# Patient Record
Sex: Female | Born: 1995 | Race: Black or African American | Hispanic: No | Marital: Single | Smoking: Never smoker
Health system: Southern US, Community
[De-identification: ages and names within clinical notes are randomized; demographics above are authoritative.]

## PROBLEM LIST (undated history)

## (undated) ENCOUNTER — Inpatient Hospital Stay (HOSPITAL_COMMUNITY): Payer: Self-pay

## (undated) DIAGNOSIS — J45909 Unspecified asthma, uncomplicated: Secondary | ICD-10-CM

## (undated) DIAGNOSIS — F419 Anxiety disorder, unspecified: Secondary | ICD-10-CM

## (undated) DIAGNOSIS — F32A Depression, unspecified: Secondary | ICD-10-CM

## (undated) DIAGNOSIS — R519 Headache, unspecified: Secondary | ICD-10-CM

## (undated) HISTORY — PX: OTHER SURGICAL HISTORY: SHX169

## (undated) HISTORY — PX: NO PAST SURGERIES: SHX2092

## (undated) HISTORY — DX: Headache, unspecified: R51.9

## (undated) HISTORY — DX: Depression, unspecified: F32.A

---

## 2020-03-20 ENCOUNTER — Encounter (HOSPITAL_COMMUNITY): Payer: Self-pay

## 2020-03-20 ENCOUNTER — Other Ambulatory Visit: Payer: Self-pay

## 2020-03-20 ENCOUNTER — Emergency Department (HOSPITAL_COMMUNITY)
Admission: EM | Admit: 2020-03-20 | Discharge: 2020-03-20 | Disposition: A | Discharge status: Home / Self Care | Payer: Self-pay | Attending: Emergency Medicine | Admitting: Emergency Medicine

## 2020-03-20 ENCOUNTER — Emergency Department (HOSPITAL_COMMUNITY): Payer: Self-pay

## 2020-03-20 DIAGNOSIS — R079 Chest pain, unspecified: Secondary | ICD-10-CM | POA: Insufficient documentation

## 2020-03-20 DIAGNOSIS — Z5321 Procedure and treatment not carried out due to patient leaving prior to being seen by health care provider: Secondary | ICD-10-CM | POA: Insufficient documentation

## 2020-03-20 HISTORY — DX: Unspecified asthma, uncomplicated: J45.909

## 2020-03-20 MED ORDER — SODIUM CHLORIDE 0.9% FLUSH: 3.0000 mL | Freq: Once | INTRAVENOUS | Status: DC

## 2020-03-20 NOTE — ED Notes (Signed)
Called in lobby times x1 no answer, will re check shortly.

## 2020-03-20 NOTE — ED Triage Notes (Signed)
Patient states she has had chest pain x 3 days. Patient also c/o left arm tingling and numbness.

## 2020-03-20 NOTE — ED Triage Notes (Signed)
Pt BIB [facility] c/o R arm tingling x3 days. Hx of anxiety. No injury.

## 2020-04-22 ENCOUNTER — Emergency Department (HOSPITAL_BASED_OUTPATIENT_CLINIC_OR_DEPARTMENT_OTHER): Payer: Self-pay

## 2020-04-22 ENCOUNTER — Emergency Department (HOSPITAL_BASED_OUTPATIENT_CLINIC_OR_DEPARTMENT_OTHER)
Admission: EM | Admit: 2020-04-22 | Discharge: 2020-04-22 | Disposition: A | Discharge status: Home / Self Care | Payer: Self-pay | Attending: Emergency Medicine | Admitting: Emergency Medicine

## 2020-04-22 ENCOUNTER — Encounter (HOSPITAL_BASED_OUTPATIENT_CLINIC_OR_DEPARTMENT_OTHER): Payer: Self-pay | Admitting: Emergency Medicine

## 2020-04-22 ENCOUNTER — Other Ambulatory Visit: Payer: Self-pay

## 2020-04-22 DIAGNOSIS — R0789 Other chest pain: Secondary | ICD-10-CM | POA: Insufficient documentation

## 2020-04-22 HISTORY — DX: Anxiety disorder, unspecified: F41.9

## 2020-04-22 LAB — CBC WITH DIFFERENTIAL/PLATELET
Abs Immature Granulocytes: 0 10*3/uL (ref 0.00–0.07)
Basophils Absolute: 0 10*3/uL (ref 0.0–0.1)
Basophils Relative: 0 %
Eosinophils Absolute: 0.1 10*3/uL (ref 0.0–0.5)
Eosinophils Relative: 2 %
HCT: 35.6 % — ABNORMAL LOW (ref 36.0–46.0)
Hemoglobin: 11.4 g/dL — ABNORMAL LOW (ref 12.0–15.0)
Immature Granulocytes: 0 %
Lymphocytes Relative: 36 %
Lymphs Abs: 1.3 10*3/uL (ref 0.7–4.0)
MCH: 29.8 pg (ref 26.0–34.0)
MCHC: 32 g/dL (ref 30.0–36.0)
MCV: 93.2 fL (ref 80.0–100.0)
Monocytes Absolute: 0.5 10*3/uL (ref 0.1–1.0)
Monocytes Relative: 13 %
Neutro Abs: 1.8 10*3/uL (ref 1.7–7.7)
Neutrophils Relative %: 49 %
Platelets: 230 10*3/uL (ref 150–400)
RBC: 3.82 MIL/uL — ABNORMAL LOW (ref 3.87–5.11)
RDW: 11.9 % (ref 11.5–15.5)
WBC: 3.7 10*3/uL — ABNORMAL LOW (ref 4.0–10.5)
nRBC: 0 % (ref 0.0–0.2)

## 2020-04-22 LAB — BASIC METABOLIC PANEL
Anion gap: 6 (ref 5–15)
BUN: 13 mg/dL (ref 6–20)
CO2: 26 mmol/L (ref 22–32)
Calcium: 8.4 mg/dL — ABNORMAL LOW (ref 8.9–10.3)
Chloride: 107 mmol/L (ref 98–111)
Creatinine, Ser: 0.68 mg/dL (ref 0.44–1.00)
GFR calc Af Amer: >60 mL/min (ref >60)
GFR calc non Af Amer: >60 mL/min (ref >60)
Glucose, Bld: 131 mg/dL — ABNORMAL HIGH (ref 70–99)
Potassium: 3.2 mmol/L — ABNORMAL LOW (ref 3.5–5.1)
Sodium: 139 mmol/L (ref 135–145)

## 2020-04-22 LAB — PREGNANCY, URINE: Preg Test, Ur: NEGATIVE

## 2020-04-22 LAB — TROPONIN I (HIGH SENSITIVITY): Troponin I (High Sensitivity): 2 ng/L (ref <18)

## 2020-04-22 MED ORDER — ALBUTEROL SULFATE HFA 108 (90 BASE) MCG/ACT IN AERS: 1.0000 | INHALATION_SPRAY | Freq: Once | RESPIRATORY_TRACT | Status: DC

## 2020-04-22 MED ORDER — ALBUTEROL SULFATE HFA 108 (90 BASE) MCG/ACT IN AERS
1.0000 | INHALATION_SPRAY | RESPIRATORY_TRACT | Status: DC | PRN
  Administered 2020-04-22: 1 via RESPIRATORY_TRACT
  Filled 2020-04-22: qty 6.7

## 2020-04-22 NOTE — ED Provider Notes (Signed)
[place] EMERGENCY DEPARTMENT Provider Note   CSN: [id number] Arrival date & time: 04/22/20  1755     History Chief Complaint  Patient presents with   Anxiety    Erika Klein is a 24 y.o. female.  HPI   Patient presents to the emergency department with chief complaint of shaking.  Patient explains that while she was using the bathroom she started to shake and felt short of breath.  Patient denies any chest pain, palpitations, nausea, vomiting, becoming very sweaty, radiating pain or back pain denies leg pain or leg swelling.  Patient states this is happened to her in the past but it has never lasted this long before.  Patient denies having anxiety or being diagnosed with panic attacks.  Patient has not been vaccinated for COVID-19, she has had no sick contacts.  Patient has significant medical history of asthma which she uses inhaler for.  She has had no recent surgeries no other medications.  Patient denies use of drugs, alcohol and does not smoke.  Past Medical History:  Diagnosis Date   Anxiety    Asthma     There are no problems to display for this patient.   Past Surgical History:  Procedure Laterality Date   anxiety       OB History   No obstetric history on file.     Family History  Problem Relation Age of Onset   Schizophrenia Mother    Bipolar disorder Mother    Anxiety disorder Mother    Cancer Mother    Anxiety disorder Father    Asthma Father     Social History   Tobacco Use   Smoking status: Never Smoker   Smokeless tobacco: Never Used  Vaping Use   Vaping Use: Never used  Substance Use Topics   Alcohol use: Never   Drug use: Never    Home Medications Prior to Admission medications   Not on File    Allergies    Patient has no known allergies.  Review of Systems   Review of Systems  Constitutional: Negative for chills, diaphoresis and fever.  HENT: Negative for congestion and sore throat.    Respiratory: Positive for chest tightness and shortness of breath. Negative for cough.   Cardiovascular: Negative for chest pain, palpitations and leg swelling.  Gastrointestinal: Negative for abdominal pain, nausea and vomiting.  Genitourinary: Negative for dysuria, enuresis and flank pain.  Musculoskeletal: Negative for back pain.  Skin: Negative for rash.  Neurological: Positive for headaches. Negative for dizziness.  Hematological: Does not bruise/bleed easily.    Physical Exam Updated Vital Signs BP (!) 110/54 (BP Location: Right Arm)    Pulse 67    Temp 98.7 F (37.1 C) (Oral)    Resp 20    LMP 04/08/2020 (Exact Date)    SpO2 100%   Physical Exam Vitals and nursing note reviewed.  Constitutional:      General: She is not in acute distress.    Appearance: Normal appearance. She is not ill-appearing or diaphoretic.  HENT:     Head: Normocephalic and atraumatic.     Nose: No congestion or rhinorrhea.     Mouth/Throat:     Mouth: Mucous membranes are moist.  Eyes:     General: No visual field deficit or scleral icterus.    Conjunctiva/sclera: Conjunctivae normal.     Pupils: Pupils are equal, round, and reactive to light.  Cardiovascular:     Rate and Rhythm: Normal rate and regular  rhythm.     Pulses: Normal pulses.     Heart sounds: No murmur heard.  No friction rub. No gallop.   Pulmonary:     Effort: Pulmonary effort is normal. No respiratory distress.     Breath sounds: No wheezing, rhonchi or rales.  Abdominal:     General: There is no distension.     Palpations: Abdomen is soft.     Tenderness: There is no abdominal tenderness. There is no guarding.  Musculoskeletal:        General: No swelling or tenderness.  Skin:    General: Skin is warm and dry.     Findings: No rash.  Neurological:     General: No focal deficit present.     Mental Status: She is alert and oriented to person, place, and time.     GCS: GCS eye subscore is 4. GCS verbal subscore is 5. GCS  motor subscore is 6.     Cranial Nerves: Cranial nerves are intact. No cranial nerve deficit or facial asymmetry.     Sensory: Sensation is intact. No sensory deficit.     Motor: Motor function is intact. No weakness or pronator drift.     Coordination: Coordination is intact. Romberg sign negative. Finger-Nose-Finger Test and Heel to [facility] Test normal.  Psychiatric:        Mood and Affect: Mood normal.     ED Results / Procedures / Treatments   Labs (all labs ordered are listed, but only abnormal results are displayed) Labs Reviewed  CBC WITH DIFFERENTIAL/PLATELET - Abnormal; Notable for the following components:      Result Value   WBC 3.7 (*)    RBC 3.82 (*)    Hemoglobin 11.4 (*)    HCT 35.6 (*)    All other components within normal limits  BASIC METABOLIC PANEL - Abnormal; Notable for the following components:   Potassium 3.2 (*)    Glucose, Bld 131 (*)    Calcium 8.4 (*)    All other components within normal limits  PREGNANCY, URINE  TROPONIN I (HIGH SENSITIVITY)    EKG EKG Interpretation  Date/Time:  Friday April 22 2020 18:58:04 EDT Ventricular Rate:  66 PR Interval:    QRS Duration: 87 QT Interval:  337 QTC Calculation: 353 R Axis:   55 Text Interpretation: Sinus arrhythmia No significant change since last tracing Confirmed by Tilden Fossa [phone]) on 04/22/2020 7:10:32 PM   Radiology DG Chest 2 View  Result Date: 04/22/2020 CLINICAL DATA:  Chest tightness. EXAM: CHEST - 2 VIEW COMPARISON:  Mar 20, 2020 FINDINGS: The heart size and mediastinal contours are within normal limits. Both lungs are clear. The visualized skeletal structures are unremarkable. IMPRESSION: No active cardiopulmonary disease. Electronically Signed   By: Aram Candela M.D.   On: 04/22/2020 19:38    Procedures Procedures (including critical care time)  Medications Ordered in ED Medications  albuterol (VENTOLIN HFA) 108 (90 Base) MCG/ACT inhaler 1 puff (1 puff Inhalation Given  04/22/20 2028)    ED Course  I have reviewed the triage vital signs and the nursing notes.  Pertinent labs & imaging results that were available during my care of the patient were reviewed by me and considered in my medical decision making (see chart for details).    MDM Rules/Calculators/A&P                          I have personally reviewed all imaging, labs  and have interpreted them.  Due to patient's presentation most concerned for PE versus MI versus pneumonia.  I have low suspicion for PE as patient has low risk factors, she does not smoke, no recent surgeries, no prolonged bedrest, not on birth control, denies leg pain, leg swelling, vital signs have been reassuring.  I am less concerned for a MI as patient's troponin was less than 2, EKG showed sinus arrhythmia without ischemia, she has low cardiac risk factors.  I have low suspicion for pneumonia as patient CBC did not show leukocytosis, chest x-ray showed no signs of consolidation, infiltrates, edema, or acute abnormalities.  Patient CBC did not show leukocytosis, slightly decreased hemoglobin of 11.4, BMP did not show electrolyte abnormalities, no signs of AKI.  Patient appears to be resting comfortably in bed, no obvious distress.  Vital signs have remained stable, patient does not meet emergent criteria to be admitted to the hospital.  Likely that patient's pain was a result of possible anxiety vs too much caffein vs dehydrated.  Recommend that patient follows up with her primary care doctor for further evaluation and management.  Patient has history of asthma, she just recently moved from [place] and was given a rescue inhaler.  Patient was also given at home instructions as well as strict return precautions.  Patient was discussed with attending who agrees with assessment and plan.  Patient was explained the results and plan, patient verbalized that she understood and agrees with the plan. Final Clinical Impression(s) / ED  Diagnoses Final diagnoses:  Chest tightness    Rx / DC Orders ED Discharge Orders    None       Barnie Del 04/22/20 2123    Tilden Fossa, MD 04/23/20 1050

## 2020-04-22 NOTE — Discharge Instructions (Signed)
You have been evaluated for chest tightness.  All lab and imaging look reassuring.  I have given you a refill on your inhaler please use as needed for asthma attacks.  I have also given you information for community health and wellness please schedule an appointment with them as they can help provide you with a primary care doctor  I want you to come back to emergency department if you develop shortness of breath, chest pain, become very sweaty, uncontrolled nausea, vomiting as these symptoms require further evaluation.

## 2020-04-22 NOTE — ED Triage Notes (Signed)
Per EMS:  Pt started to feel anxious, started shaking.  Then went away.  Pt has taken medications in the past which doesn't help.

## 2020-04-22 NOTE — ED Triage Notes (Signed)
Started to bath room started shaking and had panic attack  At present relaxed

## 2020-05-11 ENCOUNTER — Encounter (HOSPITAL_COMMUNITY): Payer: Self-pay

## 2020-05-11 ENCOUNTER — Other Ambulatory Visit: Payer: Self-pay

## 2020-05-11 ENCOUNTER — Emergency Department (HOSPITAL_COMMUNITY)
Admission: EM | Admit: 2020-05-11 | Discharge: 2020-05-11 | Disposition: A | Discharge status: Home / Self Care | Payer: Medicaid Other | Attending: Emergency Medicine | Admitting: Emergency Medicine

## 2020-05-11 DIAGNOSIS — R42 Dizziness and giddiness: Secondary | ICD-10-CM | POA: Insufficient documentation

## 2020-05-11 DIAGNOSIS — Z5321 Procedure and treatment not carried out due to patient leaving prior to being seen by health care provider: Secondary | ICD-10-CM | POA: Insufficient documentation

## 2020-05-11 LAB — CBC
HCT: 36.4 % (ref 36.0–46.0)
Hemoglobin: 11.7 g/dL — ABNORMAL LOW (ref 12.0–15.0)
MCH: 29.8 pg (ref 26.0–34.0)
MCHC: 32.1 g/dL (ref 30.0–36.0)
MCV: 92.9 fL (ref 80.0–100.0)
Platelets: 270 10*3/uL (ref 150–400)
RBC: 3.92 MIL/uL (ref 3.87–5.11)
RDW: 12 % (ref 11.5–15.5)
WBC: 4.1 10*3/uL (ref 4.0–10.5)
nRBC: 0 % (ref 0.0–0.2)

## 2020-05-11 LAB — I-STAT BETA HCG BLOOD, ED (MC, WL, AP ONLY): I-stat hCG, quantitative: <5 m[IU]/mL (ref <5)

## 2020-05-11 LAB — BASIC METABOLIC PANEL
Anion gap: 8 (ref 5–15)
BUN: 14 mg/dL (ref 6–20)
CO2: 25 mmol/L (ref 22–32)
Calcium: 9 mg/dL (ref 8.9–10.3)
Chloride: 107 mmol/L (ref 98–111)
Creatinine, Ser: 0.81 mg/dL (ref 0.44–1.00)
GFR calc Af Amer: >60 mL/min (ref >60)
GFR calc non Af Amer: >60 mL/min (ref >60)
Glucose, Bld: 94 mg/dL (ref 70–99)
Potassium: 3.7 mmol/L (ref 3.5–5.1)
Sodium: 140 mmol/L (ref 135–145)

## 2020-05-11 LAB — CBG MONITORING, ED: Glucose-Capillary: 95 mg/dL (ref 70–99)

## 2020-05-11 MED ORDER — SODIUM CHLORIDE 0.9% FLUSH: 3.0000 mL | Freq: Once | INTRAVENOUS | Status: DC

## 2020-05-11 NOTE — ED Notes (Signed)
No answer from lobby  

## 2020-05-11 NOTE — ED Triage Notes (Signed)
Patient arrived with gcmes with complaints of feeling dizzy/lightheaded for the last hour and a half. Declines and NV. Patient standing and talking on phone in triage-no acute distress.

## 2020-05-11 NOTE — ED Notes (Signed)
I called patient in the lobby and outside for a room and no one responded 

## 2020-05-14 ENCOUNTER — Other Ambulatory Visit: Payer: Self-pay

## 2020-05-14 ENCOUNTER — Encounter (HOSPITAL_BASED_OUTPATIENT_CLINIC_OR_DEPARTMENT_OTHER): Payer: Self-pay | Admitting: Emergency Medicine

## 2020-05-14 ENCOUNTER — Emergency Department (HOSPITAL_BASED_OUTPATIENT_CLINIC_OR_DEPARTMENT_OTHER)
Admission: EM | Admit: 2020-05-14 | Discharge: 2020-05-14 | Disposition: A | Discharge status: Home / Self Care | Payer: Medicaid Other | Attending: Emergency Medicine | Admitting: Emergency Medicine

## 2020-05-14 DIAGNOSIS — J45909 Unspecified asthma, uncomplicated: Secondary | ICD-10-CM | POA: Insufficient documentation

## 2020-05-14 DIAGNOSIS — H8110 Benign paroxysmal vertigo, unspecified ear: Secondary | ICD-10-CM | POA: Diagnosis present

## 2020-05-14 MED ORDER — ACETAMINOPHEN 500 MG PO TABS
1000.0000 mg | ORAL_TABLET | Freq: Once | ORAL | Status: AC
  Administered 2020-05-14: 1000 mg via ORAL
  Filled 2020-05-14: qty 2

## 2020-05-14 MED ORDER — MECLIZINE HCL 25 MG PO TABS: 25.0000 mg | ORAL_TABLET | Freq: Once | ORAL | Status: DC

## 2020-05-14 MED ORDER — MECLIZINE HCL 25 MG PO TABS
25.0000 mg | ORAL_TABLET | Freq: Three times a day (TID) | ORAL | 0 refills | Status: DC | PRN
Start: 2020-05-14 — End: 2021-05-20

## 2020-05-14 NOTE — Discharge Instructions (Signed)
Follow up with a family doctor.  Return for inability to walk, one sided numbness weakness difficulty with speech or swallowing.

## 2020-05-14 NOTE — ED Provider Notes (Signed)
[facility] EMERGENCY DEPARTMENT Provider Note   CSN: [id number] Arrival date & time: 05/14/20  6834     History Chief Complaint  Patient presents with  . Dizziness    Erika Klein is a 24 y.o. female.  24 yo F with a chief complaint of dizziness.  Initially has difficulty describing this then later states that she feels like it is a sensation like the room is spinning.  Worse when she lays her head back flat or when she sits up or stands up or has eye movement.  She denies one-sided numbness or weakness denies difficulty speech swallowing denies headache neck pain denies cough or congestion denies earache.  The history is provided by the patient.  Dizziness Quality:  Head spinning Severity:  Moderate Onset quality:  Gradual Duration:  3 days Timing:  Constant Progression:  Waxing and waning Chronicity:  New Context: head movement   Relieved by:  Nothing Worsened by:  Lying down and standing up Ineffective treatments:  None tried Associated symptoms: no chest pain, no headaches, no nausea, no palpitations, no shortness of breath and no vomiting        Past Medical History:  Diagnosis Date  . Anxiety   . Asthma     There are no problems to display for this patient.   Past Surgical History:  Procedure Laterality Date  . anxiety       OB History   No obstetric history on file.     Family History  Problem Relation Age of Onset  . Schizophrenia Mother   . Bipolar disorder Mother   . Anxiety disorder Mother   . Cancer Mother   . Anxiety disorder Father   . Asthma Father     Social History   Tobacco Use  . Smoking status: Never Smoker  . Smokeless tobacco: Never Used  Vaping Use  . Vaping Use: Never used  Substance Use Topics  . Alcohol use: Never  . Drug use: Never    Home Medications Prior to Admission medications   Medication Sig Start Date End Date Taking? Authorizing Provider  meclizine (ANTIVERT) 25 MG tablet Take 1 tablet  (25 mg total) by mouth 3 (three) times daily as needed for dizziness. 05/14/20   Melene Plan, DO    Allergies    Patient has no known allergies.  Review of Systems   Review of Systems  Constitutional: Negative for chills and fever.  HENT: Negative for congestion and rhinorrhea.   Eyes: Negative for redness and visual disturbance.  Respiratory: Negative for shortness of breath and wheezing.   Cardiovascular: Negative for chest pain and palpitations.  Gastrointestinal: Negative for nausea and vomiting.  Genitourinary: Negative for dysuria and urgency.  Musculoskeletal: Negative for arthralgias and myalgias.  Skin: Negative for pallor and wound.  Neurological: Positive for dizziness. Negative for headaches.    Physical Exam Updated Vital Signs BP 129/74 (BP Location: Right Arm)   Pulse (!) 55   Temp 99 F (37.2 C) (Oral)   Resp 17   LMP 05/11/2020   Physical Exam Vitals and nursing note reviewed.  Constitutional:      General: She is not in acute distress.    Appearance: She is well-developed. She is not diaphoretic.  HENT:     Head: Normocephalic and atraumatic.     Ears:     Comments: Wax obscures the left TM.  Right TM is normal. Eyes:     Pupils: Pupils are equal, round, and reactive to  light.  Cardiovascular:     Rate and Rhythm: Normal rate and regular rhythm.     Heart sounds: No murmur heard.  No friction rub. No gallop.   Pulmonary:     Effort: Pulmonary effort is normal.     Breath sounds: No wheezing or rales.  Abdominal:     General: There is no distension.     Palpations: Abdomen is soft.     Tenderness: There is no abdominal tenderness.  Musculoskeletal:        General: No tenderness.     Cervical back: Normal range of motion and neck supple.  Skin:    General: Skin is warm and dry.  Neurological:     Mental Status: She is alert and oriented to person, place, and time.     GCS: GCS eye subscore is 4. GCS verbal subscore is 5. GCS motor subscore is 6.      Cranial Nerves: Cranial nerves are intact.     Sensory: Sensation is intact.     Motor: Motor function is intact.     Coordination: Coordination is intact.     Gait: Gait is intact.     Comments: Benign abdominal exam able to ambulate without difficulty  Psychiatric:        Behavior: Behavior normal.     ED Results / Procedures / Treatments   Labs (all labs ordered are listed, but only abnormal results are displayed) Labs Reviewed - No data to display  EKG None  Radiology No results found.  Procedures Procedures (including critical care time)  Medications Ordered in ED Medications  meclizine (ANTIVERT) tablet 25 mg (25 mg Oral Refused 05/14/20 0324)  acetaminophen (TYLENOL) tablet 1,000 mg (1,000 mg Oral Given 05/14/20 0319)    ED Course  I have reviewed the triage vital signs and the nursing notes.  Pertinent labs & imaging results that were available during my care of the patient were reviewed by me and considered in my medical decision making (see chart for details).    MDM Rules/Calculators/A&P                          24 yo F with a chief complaints of dizziness.  Clinically this sounds like BPPV.  She does have dizziness with eye movements and standing but is able to ambulate without difficulty.  The patient presented to the [facility] emergency department and had lab work drawn but did not wait to be seen by provider.  I reviewed this lab work from 3 days ago which is essentially benign.  She is a very mild anemia that I think is unlikely to cause the symptoms she is not pregnant has no significant electrolyte abnormality.  Do not feel that I need to repeat lab work today.  Will treat with meclizine PCP follow-up.  3:26 AM:  I have discussed the diagnosis/risks/treatment options with the patient and believe the pt to be eligible for discharge home to follow-up with PCP. We also discussed returning to the ED immediately if new or worsening sx occur. We discussed  the sx which are most concerning (e.g., sudden worsening pain, fever, inability to tolerate by mouth) that necessitate immediate return. Medications administered to the patient during their visit and any new prescriptions provided to the patient are listed below.  Medications given during this visit Medications  meclizine (ANTIVERT) tablet 25 mg (25 mg Oral Refused 05/14/20 0324)  acetaminophen (TYLENOL) tablet 1,000 mg (1,000  mg Oral Given 05/14/20 0319)     The patient appears reasonably screen and/or stabilized for discharge and I doubt any other medical condition or other [facility] requiring further screening, evaluation, or treatment in the ED at this time prior to discharge.   Final Clinical Impression(s) / ED Diagnoses Final diagnoses:  BPPV (benign paroxysmal positional vertigo), unspecified laterality    Rx / DC Orders ED Discharge Orders         Ordered    meclizine (ANTIVERT) 25 MG tablet  3 times daily PRN     Discontinue  Reprint     05/14/20 0303           Melene Plan, DO 05/14/20 3875

## 2020-05-14 NOTE — ED Triage Notes (Signed)
Pt presents with c/o dizziness for 3 days

## 2020-05-14 NOTE — ED Notes (Signed)
Pt discharged to home with family. NAD.  

## 2020-11-05 NOTE — L&D Delivery Note (Signed)
OB/GYN Faculty Practice Delivery Note  Erika Klein is a 25 y.o. [id number] s/p SVD at [redacted]w[redacted]d. She was admitted for postdates IOL.   ROM: Membranes ruptured with delivery of fetal head (moderate meconium stained fluid) GBS Status: Negative  Delivery Date/Time: 08/16/21 at 2113  Delivery: Called to room and patient was complete and pushing. Head delivered LOA. Loose nuchal cord present and reduced at the perineum. Shoulder and body delivered in usual fashion. Infant with spontaneous cry, placed on mother's abdomen, dried and stimulated. Cord clamped x 2 and cut by FOB under my direct supervision. Cord blood drawn. Placenta delivered spontaneously with gentle cord traction. Fundus firm with massage and Pitocin. Labia, perineum, vagina, and cervix were inspected, and patient was found to have bilateral periurethral lacerations that were repaired with 4-0 Monocryl.   Placenta: Intact; 3VC - sent to L&D Complications: None Lacerations: Bilateral periurethral  EBL: 100 cc Analgesia: Local lidocaine 1%  Infant: Viable female  APGARs 8 and 9  Evalina Field, MD OB/GYN Fellow, Faculty Practice

## 2021-01-03 DIAGNOSIS — Z419 Encounter for procedure for purposes other than remedying health state, unspecified: Secondary | ICD-10-CM | POA: Diagnosis not present

## 2021-02-03 DIAGNOSIS — Z419 Encounter for procedure for purposes other than remedying health state, unspecified: Secondary | ICD-10-CM | POA: Diagnosis not present

## 2021-03-05 DIAGNOSIS — Z419 Encounter for procedure for purposes other than remedying health state, unspecified: Secondary | ICD-10-CM | POA: Diagnosis not present

## 2021-04-05 DIAGNOSIS — Z419 Encounter for procedure for purposes other than remedying health state, unspecified: Secondary | ICD-10-CM | POA: Diagnosis not present

## 2021-05-05 DIAGNOSIS — Z419 Encounter for procedure for purposes other than remedying health state, unspecified: Secondary | ICD-10-CM | POA: Diagnosis not present

## 2021-05-11 ENCOUNTER — Encounter: Payer: Self-pay | Admitting: *Deleted

## 2021-05-15 ENCOUNTER — Other Ambulatory Visit: Payer: Self-pay

## 2021-05-15 ENCOUNTER — Telehealth (INDEPENDENT_AMBULATORY_CARE_PROVIDER_SITE_OTHER): Payer: Medicaid Other | Admitting: *Deleted

## 2021-05-15 DIAGNOSIS — Z3A27 27 weeks gestation of pregnancy: Secondary | ICD-10-CM

## 2021-05-15 DIAGNOSIS — Z3490 Encounter for supervision of normal pregnancy, unspecified, unspecified trimester: Secondary | ICD-10-CM

## 2021-05-15 DIAGNOSIS — F3289 Other specified depressive episodes: Secondary | ICD-10-CM

## 2021-05-15 DIAGNOSIS — O0990 Supervision of high risk pregnancy, unspecified, unspecified trimester: Secondary | ICD-10-CM | POA: Insufficient documentation

## 2021-05-15 DIAGNOSIS — O0932 Supervision of pregnancy with insufficient antenatal care, second trimester: Secondary | ICD-10-CM

## 2021-05-15 DIAGNOSIS — F32A Depression, unspecified: Secondary | ICD-10-CM | POA: Insufficient documentation

## 2021-05-15 DIAGNOSIS — F419 Anxiety disorder, unspecified: Secondary | ICD-10-CM

## 2021-05-15 DIAGNOSIS — O0930 Supervision of pregnancy with insufficient antenatal care, unspecified trimester: Secondary | ICD-10-CM | POA: Insufficient documentation

## 2021-05-15 MED ORDER — BLOOD PRESSURE KIT DEVI: 1.0000 | 0 refills | Status: DC | PRN

## 2021-05-15 MED ORDER — GOJJI WEIGHT SCALE MISC: 1.0000 | 0 refills | Status: DC | PRN

## 2021-05-15 NOTE — Patient Instructions (Addendum)
  At our [facility] OB/GYN Practices, we work as an integrated team, providing care to address both physical and emotional health. Your medical provider may refer you to see our Behavioral Health Clinician [facility]) on the same day you see your medical provider, as availability permits; often scheduled virtually at your convenience.  Our [facility] is available to all patients, visits generally last between 20-30 minutes, but can be longer or shorter, depending on patient need. The [facility] offers help with stress management, coping with symptoms of depression and anxiety, major life changes , sleep issues, changing risky behavior, grief and loss, life stress, working on personal life goals, and  behavioral health issues, as these all affect your overall health and wellness.  The [facility] is NOT available for the following: FMLA paperwork, court-ordered evaluations, specialty assessments (custody or disability), letters to employers, or obtaining certification for an emotional support animal. The [facility] does not provide long-term therapy. You have the right to refuse integrated behavioral health services, or to reschedule to see the [facility] at a later date.  Confidentiality exception: If it is suspected that a child or disabled adult is being abused or neglected, we are required by law to report that to either Child Protective Services or Adult Management consultant.  If you have a diagnosis of Bipolar affective disorder, Schizophrenia, or recurrent Major depressive disorder, we will recommend that you establish care with a psychiatrist, as these are lifelong, chronic conditions, and we want your overall emotional health and medications to be more closely monitored. If you anticipate needing extended maternity leave due to mental health issues postpartum, it it recommended you inform your medical provider, so we can put in a referral to a psychiatrist as soon as possible. The [facility] is unable to recommend an extended maternity leave for mental  health issues. Your medical provider or [facility] may refer you to a therapist for ongoing, traditional therapy, or to a psychiatrist, for medication management, if it would benefit your overall health. Depending on your insurance, you may have a copay or be charged a deductible, depending on your insurance, to see the [facility]. If you are uninsured, it is recommended that you apply for financial assistance. (Forms may be requested at the front desk for in-person visits, via MyChart, or request a form during a virtual visit).  If you see the [facility] more than 6 times, you will have to complete a comprehensive clinical assessment interview with the [facility] to resume integrated services.  For virtual visits with the [facility], you must be physically in the state of [place] at the time of the visit. For example, if you live in [place], you will have to do an in-person visit with the [facility], and your out-of-state insurance may not cover behavioral health services in [place]. If you are going out of the state or country for any reason, the [facility] may see you virtually when you return to [place], but not while you are physically outside of [place].     [email or web address] is a Chief Technology Officer for your pregnancy

## 2021-05-15 NOTE — Progress Notes (Signed)
New OB Intake  I connected with  Erika Klein on 05/15/21 at  2:15 PM EDT by MyChart Video Visit and verified that I am speaking with the correct person using two identifiers. Nurse is located at [facility] and pt is located in her  car .sitting, not driving.  I discussed the limitations, risks, security and privacy concerns of performing an evaluation and management service by telephone and the availability of in person appointments. I also discussed with the patient that there may be a patient responsible charge related to this service. The patient expressed understanding and agreed to proceed.  I explained I am completing New OB Intake today. We discussed her EDD of 08/08/21 that is based on LMP of 11/01/20. Pt is G3/P1011. I reviewed her allergies, medications, Medical/Surgical/OB history, and appropriate screenings. I informed her of [facility] services. Based on history, this is a/an  pregnancy  complicated by [facility].   Patient Active Problem List   Diagnosis Date Noted   Supervision of high risk pregnancy, antepartum 05/15/2021   Anxiety    Depression    Late prenatal care     Concerns addressed today  Delivery Plans:  Plans to deliver at [facility].   MyChart/Babyscripts MyChart access verified. I explained pt will have some visits in office and some virtually. Babyscripts instructions given and order placed.   Blood Pressure Cuff  Blood pressure cuff ordered for patient to pick-up from [employer]. Explained after first prenatal appt pt will check weekly and document in Babyscripts.  Weight scale: Patient  does not   have weight scale. Weight scale ordered for patient to pick up from [employer].  Anatomy US Explained first scheduled [place] will be asap since she is already [redacted]w[redacted]d. Anatomy US scheduled for 05/25/21 at 2:45. Pt notified to arrive at 2:30.  Labs. Routine prenatal labs needed.  Covid Vaccine Patient has not had the covid vaccine.   Mother/ Baby Dyad Candidate?    No If yes, offer as possibility  Informed patient of [facility] and placed .phrase  In AVS.  Social Determinants of Health Food Insecurity: Patient expresses food insecurity. Food Market information given to patient; explained patient may visit at the end of first OB appointment. WIC Referral: Patient is interested in referral to [facility].  Transportation: Patient denies transportation needs. Childcare: Discussed no children allowed at ultrasound appointments. Offered childcare services; patient declines childcare services at this time.   Placed OB Box on problem list and updated  First visit review I Informed patient she will be contacted with a new ob appointment by registrar.  I explained she will have a pelvic exam, ob bloodwork with genetic screening, if desired  and PAP smear. . Explained that patient will be seen by pregnancy navigator following visit with provider. [facility] information placed in AVS.   Tyreshia Ingman,RN 05/15/2021  3:11 PM

## 2021-05-20 ENCOUNTER — Inpatient Hospital Stay (HOSPITAL_COMMUNITY)
Admission: AD | Admit: 2021-05-20 | Discharge: 2021-05-20 | Disposition: A | Discharge status: Home / Self Care | Payer: Medicaid Other | Attending: Obstetrics and Gynecology | Admitting: Obstetrics and Gynecology

## 2021-05-20 ENCOUNTER — Encounter (HOSPITAL_COMMUNITY): Payer: Self-pay | Admitting: Obstetrics and Gynecology

## 2021-05-20 ENCOUNTER — Other Ambulatory Visit: Payer: Self-pay

## 2021-05-20 DIAGNOSIS — N760 Acute vaginitis: Secondary | ICD-10-CM | POA: Diagnosis not present

## 2021-05-20 DIAGNOSIS — O23593 Infection of other part of genital tract in pregnancy, third trimester: Secondary | ICD-10-CM | POA: Diagnosis not present

## 2021-05-20 DIAGNOSIS — Z79899 Other long term (current) drug therapy: Secondary | ICD-10-CM | POA: Insufficient documentation

## 2021-05-20 DIAGNOSIS — R252 Cramp and spasm: Secondary | ICD-10-CM | POA: Diagnosis not present

## 2021-05-20 DIAGNOSIS — O26893 Other specified pregnancy related conditions, third trimester: Secondary | ICD-10-CM | POA: Insufficient documentation

## 2021-05-20 DIAGNOSIS — O99891 Other specified diseases and conditions complicating pregnancy: Secondary | ICD-10-CM

## 2021-05-20 DIAGNOSIS — O4693 Antepartum hemorrhage, unspecified, third trimester: Secondary | ICD-10-CM | POA: Insufficient documentation

## 2021-05-20 DIAGNOSIS — B9689 Other specified bacterial agents as the cause of diseases classified elsewhere: Secondary | ICD-10-CM | POA: Insufficient documentation

## 2021-05-20 DIAGNOSIS — Z3A28 28 weeks gestation of pregnancy: Secondary | ICD-10-CM | POA: Diagnosis not present

## 2021-05-20 DIAGNOSIS — Z3689 Encounter for other specified antenatal screening: Secondary | ICD-10-CM | POA: Diagnosis not present

## 2021-05-20 DIAGNOSIS — O4690 Antepartum hemorrhage, unspecified, unspecified trimester: Secondary | ICD-10-CM

## 2021-05-20 LAB — URINALYSIS, ROUTINE W REFLEX MICROSCOPIC
Bilirubin Urine: NEGATIVE
Glucose, UA: NEGATIVE mg/dL
Hgb urine dipstick: NEGATIVE
Ketones, ur: NEGATIVE mg/dL
Leukocytes,Ua: NEGATIVE
Nitrite: NEGATIVE
Protein, ur: NEGATIVE mg/dL
Specific Gravity, Urine: 1.017 (ref 1.005–1.030)
pH: 6 (ref 5.0–8.0)

## 2021-05-20 LAB — WET PREP, GENITAL
Sperm: NONE SEEN
Trich, Wet Prep: NONE SEEN
Yeast Wet Prep HPF POC: NONE SEEN

## 2021-05-20 MED ORDER — METRONIDAZOLE 0.75 % VA GEL: 1.0000 | Freq: Every day | VAGINAL | 0 refills | Status: DC

## 2021-05-20 NOTE — MAU Provider Note (Signed)
History     CSN: [id number]  Arrival date and time: 05/20/21 0342   Event Date/Time   First Provider Initiated Contact with Patient 05/20/21 0419      Chief Complaint  Patient presents with   Vaginal Bleeding   Erika Klein is a 25 y.o. G3P1011 at 6w4dwho [facility]  She presents today for Vaginal Bleeding and Cramping.  She states she noticed the bleeding around 0330.  She states she went to the bathroom and noticed blood on the tissue and "dripping in the toilet."  She reports the blood was "light red... pink kind of." She states that upon arrival, she did not notice any blood.  She reports her cramping started prior to going to bed around 11pm.  She states she was having "pain in my back and cramps in my butt."  She states the cramping, in her buttocks, continued upon waking.  She states the cramping is constant and has no worsening or relieving factors.  She rates the pain a 6/10.  She denies sexual activity in the past 3 days.  She endorses fetal movement.    OB History     Gravida  3   Para  1   Term  1   Preterm      AB  1   Living  1      SAB      IAB  1   Ectopic      Multiple      Live Births  1           Past Medical History:  Diagnosis Date   Anxiety    Asthma    Depression     Past Surgical History:  Procedure Laterality Date   anxiety      Family History  Problem Relation Age of Onset   Schizophrenia Mother    Bipolar disorder Mother    Anxiety disorder Mother    Cancer Mother    Cancer - Cervical Mother    Anxiety disorder Father    Asthma Father     Social History   Tobacco Use   Smoking status: Never   Smokeless tobacco: Never  Vaping Use   Vaping Use: Never used  Substance Use Topics   Alcohol use: Never   Drug use: Never    Allergies: No Known Allergies  Medications Prior to Admission  Medication Sig Dispense Refill Last Dose   Prenatal MV & Min w/FA-DHA (PRENATAL GUMMIES PO)  Take 1 tablet by mouth daily.   05/19/2021   Blood Pressure Monitoring (BLOOD PRESSURE KIT) DEVI 1 Device by Does not apply route as needed. 1 each 0    meclizine (ANTIVERT) 25 MG tablet Take 1 tablet (25 mg total) by mouth 3 (three) times daily as needed for dizziness. (Patient not taking: Reported on 05/15/2021) 30 tablet 0    Misc. Devices (GOJJI WEIGHT SCALE) MISC 1 Device by Does not apply route as needed. 1 each 0     Review of Systems  Gastrointestinal:  Negative for constipation, diarrhea, nausea and vomiting.  Genitourinary:  Positive for vaginal bleeding. Negative for difficulty urinating, dysuria and vaginal discharge.  Physical Exam   Blood pressure (!) 122/59, pulse 72, temperature 98.1 F (36.7 C), temperature source Oral, resp. rate 16, last menstrual period 11/01/2020, SpO2 99 %.  Physical Exam Vitals reviewed. Exam conducted with a chaperone present.  Constitutional:      Appearance: Normal appearance. She is  not ill-appearing.  HENT:     Head: Normocephalic and atraumatic.  Eyes:     Conjunctiva/sclera: Conjunctivae normal.  Cardiovascular:     Rate and Rhythm: Normal rate.  Pulmonary:     Effort: Pulmonary effort is normal.     Breath sounds: Normal breath sounds.  Abdominal:     General: Bowel sounds are normal.  Genitourinary:    Comments: Speculum Exam: -Normal External Genitalia: Non tender, no apparent discharge at introitus.  -Vaginal Vault: Pink mucosa with good rugae. Moderate amt frothy grayish white discharge.  Malodor present -wet prep collected -Cervix:Pink, no lesions, cysts, or polyps.  Appears closed. Friable. No active bleeding from os-GC/CT collected -Bimanual Exam:  Closed Musculoskeletal:        General: Normal range of motion.     Cervical back: Normal range of motion.  Skin:    General: Skin is warm and dry.  Neurological:     Mental Status: She is alert and oriented to person, place, and time.  Psychiatric:        Mood and Affect: Mood  normal.        Behavior: Behavior normal.        Thought Content: Thought content normal.    Fetal Assessment 140 bpm, Mod Var, -Decels, +10x10 Accels Toco: No Ctx Graphed  MAU Course   Results for orders placed or performed during the hospital encounter of 05/20/21 (from the past 24 hour(s))  Wet prep, genital     Status: Abnormal   Collection Time: 05/20/21  4:38 AM   Specimen: Vaginal  Result Value Ref Range   Yeast Wet Prep HPF POC NONE SEEN NONE SEEN   Trich, Wet Prep NONE SEEN NONE SEEN   Clue Cells Wet Prep HPF POC PRESENT (A) NONE SEEN   WBC, Wet Prep HPF POC MANY (A) NONE SEEN   Sperm NONE SEEN   Urinalysis, Routine w reflex microscopic Urine, Clean Catch     Status: None   Collection Time: 05/20/21  4:46 AM  Result Value Ref Range   Color, Urine YELLOW YELLOW   APPearance CLEAR CLEAR   Specific Gravity, Urine 1.017 1.005 - 1.030   pH 6.0 5.0 - 8.0   Glucose, UA NEGATIVE NEGATIVE mg/dL   Hgb urine dipstick NEGATIVE NEGATIVE   Bilirubin Urine NEGATIVE NEGATIVE   Ketones, ur NEGATIVE NEGATIVE mg/dL   Protein, ur NEGATIVE NEGATIVE mg/dL   Nitrite NEGATIVE NEGATIVE   Leukocytes,Ua NEGATIVE NEGATIVE   No results found.  MDM PE Labs: GC/CT, Wet Prep EFM Assessment and Plan  25 year old G3P1011  SIUP at 28.4 weeks Cat I FT Vaginal Bleeding Cramping of Buttocks  -POC Reviewed -Exam performed and findings discussed. -Cultures collected and pending.  -Informed that findings are suspicious for BV or trichomoniasis.  -Patient reports history of both. -Patient offered heating pad for buttocks and accepts. -Will await results and reassess   Maryann Conners MSN, CNM 05/20/2021, 4:19 AM   Reassessment (5:34 AM) Bacterial Vaginosis  -Results return significant for clue cells c/w BV. -Provider to bedside to discuss findings. -Patient requests vaginal treatment. -Metrogel 0.75% PV QHS x 5days sent to pharmacy on file.  -Patient cautioned that insertion of gel  may cause further irritation and spotting.  -Patient verbalizes understanding and without questions or concerns. -Encouraged to call or return to MAU if symptoms worsen or with the onset of new symptoms. -Discharged to home in stable condition.  Maryann Conners MSN, CNM Advanced Practice Provider,  [facility]

## 2021-05-20 NOTE — MAU Note (Signed)
Pt arrived EMS. Reports VB after she woke up around 0315, bright red blood dripping in toilet and on tissue when she wipes. Constant Cramping in back and butt, 5/10. Reports +FM and denies LOF. No prenatal care, [facility]

## 2021-05-22 LAB — GC/CHLAMYDIA PROBE AMP (~~LOC~~) NOT AT ARMC
Chlamydia: NEGATIVE
Comment: NEGATIVE
Comment: NORMAL
Neisseria Gonorrhea: POSITIVE — AB

## 2021-05-23 ENCOUNTER — Telehealth: Payer: Self-pay

## 2021-05-23 DIAGNOSIS — O98213 Gonorrhea complicating pregnancy, third trimester: Secondary | ICD-10-CM | POA: Insufficient documentation

## 2021-05-23 NOTE — Telephone Encounter (Addendum)
-----   Message from Gerrit Heck, [place] sent at 05/23/2021  9:04 AM EDT ----- Please contact patient and set up appt for [facility] treatment.  Thank you, JE   Called pt; VM left stating I am calling with results and we will attempt to contact patient tomorrow. Callback number given.

## 2021-05-24 NOTE — Telephone Encounter (Signed)
Call and spoke with pt. Pt given results and recommendations per [facility], CNM. Pt verbalized understanding and agreeable to plan of care.  Pt to come for nurse visit for [facility] treatment on 7/21 at 2pm. Pt agreeable to date and time of appt.   Judeth Cornfield, RN

## 2021-05-25 ENCOUNTER — Ambulatory Visit: Payer: Medicaid Other | Admitting: *Deleted

## 2021-05-25 ENCOUNTER — Ambulatory Visit (INDEPENDENT_AMBULATORY_CARE_PROVIDER_SITE_OTHER): Payer: Medicaid Other | Admitting: General Practice

## 2021-05-25 ENCOUNTER — Ambulatory Visit: Payer: Medicaid Other | Attending: Family Medicine

## 2021-05-25 ENCOUNTER — Other Ambulatory Visit: Payer: Self-pay

## 2021-05-25 VITALS — BP 126/74 | HR 71

## 2021-05-25 VITALS — BP 114/65 | HR 81 | Ht 64.0 in | Wt 225.0 lb

## 2021-05-25 DIAGNOSIS — O98213 Gonorrhea complicating pregnancy, third trimester: Secondary | ICD-10-CM

## 2021-05-25 DIAGNOSIS — F419 Anxiety disorder, unspecified: Secondary | ICD-10-CM | POA: Insufficient documentation

## 2021-05-25 DIAGNOSIS — O0990 Supervision of high risk pregnancy, unspecified, unspecified trimester: Secondary | ICD-10-CM

## 2021-05-25 DIAGNOSIS — O0930 Supervision of pregnancy with insufficient antenatal care, unspecified trimester: Secondary | ICD-10-CM

## 2021-05-25 MED ORDER — CEFTRIAXONE SODIUM 500 MG IJ SOLR
500.0000 mg | Freq: Once | INTRAMUSCULAR | Status: AC
  Administered 2021-05-25: 500 mg via INTRAMUSCULAR

## 2021-05-25 NOTE — Progress Notes (Signed)
Patient was assessed and managed by nursing staff during this encounter. I have reviewed the chart and agree with the documentation and plan.   Lillias Difrancesco, MD 05/25/2021 2:48 PM 

## 2021-05-25 NOTE — Progress Notes (Signed)
Erika Klein here for Rocephin  Injection.  Injection administered without complication. Patient will return  next week for new OB visit.  Marylynn Pearson, RN 05/25/2021  2:33 PM

## 2021-05-26 ENCOUNTER — Other Ambulatory Visit: Payer: Self-pay | Admitting: *Deleted

## 2021-05-26 DIAGNOSIS — Z6834 Body mass index (BMI) 34.0-34.9, adult: Secondary | ICD-10-CM

## 2021-06-01 ENCOUNTER — Other Ambulatory Visit: Payer: Self-pay | Admitting: *Deleted

## 2021-06-01 DIAGNOSIS — O0990 Supervision of high risk pregnancy, unspecified, unspecified trimester: Secondary | ICD-10-CM

## 2021-06-02 ENCOUNTER — Encounter: Payer: Medicaid Other | Admitting: Family Medicine

## 2021-06-02 ENCOUNTER — Other Ambulatory Visit: Payer: Medicaid Other

## 2021-06-05 ENCOUNTER — Other Ambulatory Visit: Payer: Medicaid Other

## 2021-06-05 ENCOUNTER — Other Ambulatory Visit: Payer: Self-pay

## 2021-06-05 DIAGNOSIS — Z419 Encounter for procedure for purposes other than remedying health state, unspecified: Secondary | ICD-10-CM | POA: Diagnosis not present

## 2021-06-08 ENCOUNTER — Other Ambulatory Visit: Payer: Medicaid Other

## 2021-06-10 ENCOUNTER — Telehealth: Payer: Medicaid Other | Admitting: Nurse Practitioner

## 2021-06-10 ENCOUNTER — Encounter: Payer: Self-pay | Admitting: Nurse Practitioner

## 2021-06-10 DIAGNOSIS — O26859 Spotting complicating pregnancy, unspecified trimester: Secondary | ICD-10-CM

## 2021-06-10 DIAGNOSIS — Z3A32 32 weeks gestation of pregnancy: Secondary | ICD-10-CM

## 2021-06-10 NOTE — Progress Notes (Signed)
Virtual Visit Consent   Erika Klein, you are scheduled for a virtual visit with a [facility] provider today.     Just as with appointments in the office, your consent must be obtained to participate.  Your consent will be active for this visit and any virtual visit you may have with one of our providers in the next 365 days.     If you have a MyChart account, a copy of this consent can be sent to you electronically.  All virtual visits are billed to your insurance company just like a traditional visit in the office.    As this is a virtual visit, video technology does not allow for your provider to perform a traditional examination.  This may limit your provider's ability to fully assess your condition.  If your provider identifies any concerns that need to be evaluated in person or the need to arrange testing (such as labs, EKG, etc.), we will make arrangements to do so.     Although advances in technology are sophisticated, we cannot ensure that it will always work on either your end or our end.  If the connection with a video visit is poor, the visit may have to be switched to a telephone visit.  With either a video or telephone visit, we are not always able to ensure that we have a secure connection.     I need to obtain your verbal consent now.   Are you willing to proceed with your visit today?    Erika Klein has provided verbal consent on 06/10/2021 for a virtual visit (video or telephone).   Apolonio Schneiders, FNP   Date: 06/10/2021 10:33 AM   Virtual Visit via Video Note   I, Apolonio Schneiders, connected with  Erika Klein  ([id number], 1996/09/09) on 06/10/21 at 10:45 AM EDT by a video-enabled telemedicine application and verified that I am speaking with the correct person using two identifiers.  Location: Patient: Virtual Visit Location Patient: Home Provider: Virtual Visit Location Provider: Office/Clinic   I discussed the limitations of evaluation and management by  telemedicine and the availability of in person appointments. The patient expressed understanding and agreed to proceed.    History of Present Illness: Erika Klein is a 25 y.o. who identifies as a female who was assigned female at birth, and is being seen today with complaints of spotting. She is [redacted] weeks pregnant. She has not felt the baby move today.   She is at home with her mother but does not have any form of transportation.   She is schedule to see an OBGYN on Monday 06/12/2021 to initiate care.   This is her third pregnancy Has a miscarriage with her first pregnancy Live birth with second pregnancy.  She is known to have high blood pressure in the past.  Last available chart note is from 05/2020- no recent vitals or office visits.    Problems:  Patient Active Problem List   Diagnosis Date Noted   Gonorrhea in pregnancy, antepartum, third trimester 05/23/2021   Supervision of high risk pregnancy, antepartum 05/15/2021   Anxiety    Depression    Late prenatal care     Allergies: No Known Allergies Medications:  Current Outpatient Medications:    Blood Pressure Monitoring (BLOOD PRESSURE KIT) DEVI, 1 Device by Does not apply route as needed. (Patient not taking: No sig reported), Disp: 1 each, Rfl: 0   metroNIDAZOLE (METROGEL VAGINAL) 0.75 % vaginal gel, Place 1 Applicatorful vaginally at bedtime.  Insert one applicator, at bedtime, for 5 nights., Disp: 70 g, Rfl: 0   Misc. Devices (GOJJI WEIGHT SCALE) MISC, 1 Device by Does not apply route as needed. (Patient not taking: No sig reported), Disp: 1 each, Rfl: 0   Prenatal MV & Min w/FA-DHA (PRENATAL GUMMIES PO), Take 1 tablet by mouth daily., Disp: , Rfl:   Observations/Objective: Patient is well-developed, well-nourished in no acute distress.  Resting comfortably at home.  Head is normocephalic, atraumatic.  No labored breathing.  Speech is clear and coherent with logical content.  Patient is alert and oriented at baseline.     Assessment and Plan: 1. [redacted] weeks gestation of pregnancy  Patient to call 911 at the end of call and report to ED for emergent evaluation   2. Spotting during pregnancy  Report to ED immediately      Follow Up Instructions: I discussed the assessment and treatment plan with the patient. The patient was provided an opportunity to ask questions and all were answered. The patient agreed with the plan and demonstrated an understanding of the instructions.  A copy of instructions were sent to the patient via MyChart.   Time:  I spent 10 minutes with the patient via telehealth technology discussing the above problems/concerns.    Apolonio Schneiders, FNP

## 2021-06-10 NOTE — Patient Instructions (Signed)
Based on what you shared with me, I feel your condition warrants further evaluation as soon as possible at an Emergency department.     As instructed please call 911 at the end of our call    please call 911.      Emergency Department-[facility]  Get Driving Directions  [phone]  [address]  [place], [place] [address]  Open 24/7/365      [facility] Emergency Department at [facility]  Get Driving Directions  [address]  [place], [place] [address]  Open 24/7/365    Emergency Department- [facility]  Get Driving Directions  [phone]  [address]  [place], [place] [address]  Open 24/7/365      [facility] Emergency Department at [facility]  Get Driving Directions  [phone]  [address]  [place], [place] [address]  Open 24/7/365    [facility]  Emergency Department- [facility]  Get Driving Directions  [phone]  [address]  [place], [place] [address]  Open 24/7/365    [place]  Emergency Department- [facility] [place]  Get Driving Directions  [address]  [place], [place] [address]  Open 24/7/365    [facility]  Emergency Department- [facility]  Get Driving Directions  [phone]  [address]  [place], [place] [address]  Open 24/7/365

## 2021-06-12 ENCOUNTER — Ambulatory Visit (INDEPENDENT_AMBULATORY_CARE_PROVIDER_SITE_OTHER): Payer: Medicaid Other | Admitting: Obstetrics and Gynecology

## 2021-06-12 ENCOUNTER — Encounter: Payer: Self-pay | Admitting: Obstetrics and Gynecology

## 2021-06-12 ENCOUNTER — Other Ambulatory Visit: Payer: Medicaid Other

## 2021-06-12 ENCOUNTER — Other Ambulatory Visit: Payer: Self-pay

## 2021-06-12 VITALS — BP 116/74 | HR 112 | Wt 226.3 lb

## 2021-06-12 DIAGNOSIS — Z5941 Food insecurity: Secondary | ICD-10-CM

## 2021-06-12 DIAGNOSIS — Z3A31 31 weeks gestation of pregnancy: Secondary | ICD-10-CM

## 2021-06-12 DIAGNOSIS — O0990 Supervision of high risk pregnancy, unspecified, unspecified trimester: Secondary | ICD-10-CM

## 2021-06-12 DIAGNOSIS — O98213 Gonorrhea complicating pregnancy, third trimester: Secondary | ICD-10-CM

## 2021-06-12 DIAGNOSIS — O0930 Supervision of pregnancy with insufficient antenatal care, unspecified trimester: Secondary | ICD-10-CM

## 2021-06-12 MED ORDER — PREPLUS 27-1 MG PO TABS: 1.0000 | ORAL_TABLET | Freq: Every day | ORAL | 11 refills | Status: DC

## 2021-06-12 NOTE — Progress Notes (Signed)
INITIAL PRENATAL VISIT NOTE  Subjective:  Erika Klein is a 25 y.o. G3P1011 at 58w6dby LMP being seen today for her initial prenatal visit.  She has an obstetric history significant for late prenatal care and HTN in a previous pregnancy. She has a medical history significant for nothing.  Patient reports no complaints.  Contractions: Irritability. Vag. Bleeding: None.  Movement: Present. Denies leaking of fluid.    Past Medical History:  Diagnosis Date   Anxiety    Asthma    Depression     Past Surgical History:  Procedure Laterality Date   anxiety     NO PAST SURGERIES      OB History  Gravida Para Term Preterm AB Living  3 1 1   1 1   SAB IAB Ectopic Multiple Live Births    1     1    # Outcome Date GA Lbr Len/2nd Weight Sex Delivery Anes PTL Lv  3 Current           2 Term 01/16/19 36w0d5 lb 11 oz (2.58 kg)  Vag-Spont EPI  LIV     Birth Comments: ? IUGR  1 IAB 09/25/17             Birth Comments: was twin pregnancy, one died early and was given abortion pill because baby wasn't viable    Social History   Socioeconomic History   Marital status: Single    Spouse name: Not on file   Number of children: Not on file   Years of education: Not on file   Highest education level: Not on file  Occupational History   Not on file  Tobacco Use   Smoking status: Former    Types: Cigarettes   Smokeless tobacco: Never  Vaping Use   Vaping Use: Never used  Substance and Sexual Activity   Alcohol use: Not Currently   Drug use: Never   Sexual activity: Yes    Birth control/protection: None  Other Topics Concern   Not on file  Social History Narrative   Not on file   Social Determinants of Health   Financial Resource Strain: Not on file  Food Insecurity: Food Insecurity Present   Worried About [place] the Last Year: Sometimes true   [place] the Last Year: Sometimes true  Transportation Needs: No Transportation Needs   Lack of  Transportation (Medical): No   Lack of Transportation (Non-Medical): No  Physical Activity: Not on file  Stress: Not on file  Social Connections: Not on file    Family History  Problem Relation Age of Onset   Schizophrenia Mother    Bipolar disorder Mother    Anxiety disorder Mother    Cancer Mother    Cancer - Cervical Mother    Anxiety disorder Father    Asthma Father      Current Outpatient Medications:    Prenatal MV & Min w/FA-DHA (PRENATAL GUMMIES PO), Take 1 tablet by mouth daily., Disp: , Rfl:    Prenatal Vit-Fe Fumarate-FA (PREPLUS) 27-1 MG TABS, Take 1 tablet by mouth daily., Disp: 30 tablet, Rfl: 11   Blood Pressure Monitoring (BLOOD PRESSURE KIT) DEVI, 1 Device by Does not apply route as needed. (Patient not taking: No sig reported), Disp: 1 each, Rfl: 0   metroNIDAZOLE (METROGEL VAGINAL) 0.75 % vaginal gel, Place 1 Applicatorful vaginally at bedtime. Insert one applicator, at bedtime, for 5 nights., Disp: 70 g, Rfl: 0  Misc. Devices (GOJJI WEIGHT SCALE) MISC, 1 Device by Does not apply route as needed. (Patient not taking: No sig reported), Disp: 1 each, Rfl: 0  No Known Allergies  Review of Systems: Negative except for what is mentioned in HPI.  Objective:   Vitals:   06/12/21 1450  BP: 116/74  Pulse: (!) 112  Weight: 226 lb 4.8 oz (102.6 kg)    Fetal Status: Fetal Heart Rate (bpm): 138   Movement: Present     Physical Exam: BP 116/74   Pulse (!) 112   Wt 226 lb 4.8 oz (102.6 kg)   LMP 11/01/2020   BMI 38.84 kg/m  CONSTITUTIONAL: Well-developed, well-nourished female in no acute distress.  NEUROLOGIC: Alert and oriented to person, place, and time. Normal reflexes, muscle tone coordination. No cranial nerve deficit noted. PSYCHIATRIC: Normal mood and affect. Normal behavior. Normal judgment and thought content. SKIN: Skin is warm and dry. No rash noted. Not diaphoretic. No erythema. No pallor. HENT:  Normocephalic, atraumatic, External right and left  ear normal. Oropharynx is clear and moist EYES: Conjunctivae and EOM are normal.  NECK: Normal range of motion, supple, no masses CARDIOVASCULAR: Normal heart rate noted, regular rhythm RESPIRATORY: Effort and breath sounds normal, no problems with respiration noted BREASTS: deferred ABDOMEN: Soft, nontender, nondistended, gravid. EF:[id number] Bimanual: fundal height 31cm MUSCULOSKELETAL: Normal range of motion. EXT:  No edema and no tenderness. 2+ distal pulses.   Assessment and Plan:  Pregnancy: G3P1011 at 94w6dby LMP  1. Supervision of high risk pregnancy, antepartum New ob visit, late prenatal care, Will get prenatal panel as those results not found in chart GC/C done in MAU - Prenatal Vit-Fe Fumarate-FA (PREPLUS) 27-1 MG TABS; Take 1 tablet by mouth daily.  Dispense: 30 tablet; Refill: 11  2. [redacted] weeks gestation of pregnancy   3. Late prenatal care   4. Gonorrhea in pregnancy, antepartum, third trimester TOCin 3-4 weeks   Preterm labor symptoms and general obstetric precautions including but not limited to vaginal bleeding, contractions, leaking of fluid and fetal movement were reviewed in detail with the patient.  Please refer to After Visit Summary for other counseling recommendations.   Return in about 2 weeks (around 06/26/2021) for ROB, in person.  LGriffin Basil8/06/2021 3:43 PM

## 2021-06-13 LAB — CBC
Hematocrit: 33.2 % — ABNORMAL LOW (ref 34.0–46.6)
Hemoglobin: 11.1 g/dL (ref 11.1–15.9)
MCH: 29.4 pg (ref 26.6–33.0)
MCHC: 33.4 g/dL (ref 31.5–35.7)
MCV: 88 fL (ref 79–97)
Platelets: 199 10*3/uL (ref 150–450)
RBC: 3.78 x10E6/uL (ref 3.77–5.28)
RDW: 11.4 % — ABNORMAL LOW (ref 11.7–15.4)
WBC: 6.2 10*3/uL (ref 3.4–10.8)

## 2021-06-13 LAB — GLUCOSE TOLERANCE, 2 HOURS W/ 1HR
Glucose, 1 hour: 116 mg/dL (ref 65–179)
Glucose, 2 hour: 85 mg/dL (ref 65–152)
Glucose, Fasting: 64 mg/dL — ABNORMAL LOW (ref 65–91)

## 2021-06-13 LAB — RPR: RPR Ser Ql: NONREACTIVE

## 2021-06-13 LAB — HIV ANTIBODY (ROUTINE TESTING W REFLEX): HIV Screen 4th Generation wRfx: NONREACTIVE

## 2021-06-14 ENCOUNTER — Telehealth: Payer: Self-pay | Admitting: Emergency Medicine

## 2021-06-14 LAB — CBC/D/PLT+RPR+RH+ABO+RUBIGG...
Antibody Screen: NEGATIVE
Basophils Absolute: 0 10*3/uL (ref 0.0–0.2)
Basos: 0 %
EOS (ABSOLUTE): 0.1 10*3/uL (ref 0.0–0.4)
Eos: 1 %
HCV Ab: <0.1 s/co ratio (ref 0.0–0.9)
HIV Screen 4th Generation wRfx: NONREACTIVE
Hematocrit: 33.8 % — ABNORMAL LOW (ref 34.0–46.6)
Hemoglobin: 11.2 g/dL (ref 11.1–15.9)
Hepatitis B Surface Ag: NEGATIVE
Immature Grans (Abs): 0 10*3/uL (ref 0.0–0.1)
Immature Granulocytes: 1 %
Lymphocytes Absolute: 1.4 10*3/uL (ref 0.7–3.1)
Lymphs: 21 %
MCH: 29.6 pg (ref 26.6–33.0)
MCHC: 33.1 g/dL (ref 31.5–35.7)
MCV: 89 fL (ref 79–97)
Monocytes Absolute: 0.7 10*3/uL (ref 0.1–0.9)
Monocytes: 10 %
Neutrophils Absolute: 4.6 10*3/uL (ref 1.4–7.0)
Neutrophils: 67 %
Platelets: 200 10*3/uL (ref 150–450)
RBC: 3.79 x10E6/uL (ref 3.77–5.28)
RDW: 12.1 % (ref 11.7–15.4)
RPR Ser Ql: NONREACTIVE
Rh Factor: POSITIVE
Rubella Antibodies, IGG: 2.34 index (ref >0.99)
WBC: 6.8 10*3/uL (ref 3.4–10.8)

## 2021-06-14 LAB — CULTURE, OB URINE

## 2021-06-14 LAB — URINE CULTURE, OB REFLEX

## 2021-06-14 LAB — HCV INTERPRETATION

## 2021-06-14 NOTE — Telephone Encounter (Signed)
   Dorris Granato DOB: 06/02/1996 MRN: [id number]   RIDER WAIVER AND RELEASE OF LIABILITY  For purposes of improving physical access to our facilities, [facility] is pleased to partner with third parties to provide [facility] patients or other authorized individuals the option of convenient, on-demand ground transportation services (the [employer]") through use of the technology service that enables users to request on-demand ground transportation from independent third-party providers.  By opting to use and accept these [employer], I, the undersigned, hereby agree on behalf of myself, and on behalf of any minor child using the Science writer for whom I am the parent or legal guardian, as follows:  Science writer provided to me are provided by independent third-party transportation providers who are not [employer] or employees and who are unaffiliated with [employer]. [facility] is neither a transportation carrier nor a common or public carrier. [facility] has no control over the quality or safety of the transportation that occurs as a result of the [employer]. [facility] cannot guarantee that any third-party transportation provider will complete any arranged transportation service. [facility] makes no representation, warranty, or guarantee regarding the reliability, timeliness, quality, safety, suitability, or availability of any of the Transport Services or that they will be error free. I fully understand that traveling by vehicle involves risks and dangers of serious bodily injury, including permanent disability, paralysis, and death. I agree, on behalf of myself and on behalf of any minor child using the Transport Services for whom I am the parent or legal guardian, that the entire risk arising out of my use of the [employer] remains solely with me, to the maximum extent permitted under applicable law. The [employer] are provided "as  is" and "as available." [facility] disclaims all representations and warranties, express, implied or statutory, not expressly set out in these terms, including the implied warranties of merchantability and fitness for a particular purpose. I hereby waive and release [facility], its agents, employees, officers, directors, representatives, insurers, attorneys, assigns, successors, subsidiaries, and affiliates from any and all past, present, or future claims, demands, liabilities, actions, causes of action, or suits of any kind directly or indirectly arising from acceptance and use of the [employer]. I further waive and release [facility] and its affiliates from all present and future liability and responsibility for any injury or death to persons or damages to property caused by or related to the use of the [employer]. I have read this Waiver and Release of Liability, and I understand the terms used in it and their legal significance. This Waiver is freely and voluntarily given with the understanding that my right (as well as the right of any minor child for whom I am the parent or legal guardian using the [employer]) to legal recourse against [place] in connection with the [employer] is knowingly surrendered in return for use of these services.   I attest that I read the consent document to [name], gave Ms. Twyman the opportunity to ask questions and answered the questions asked (if any). I affirm that Anisah Liao then provided consent for she's participation in this program.     Su Monks

## 2021-06-23 ENCOUNTER — Other Ambulatory Visit: Payer: Self-pay | Admitting: *Deleted

## 2021-06-23 ENCOUNTER — Ambulatory Visit: Payer: Medicaid Other | Admitting: *Deleted

## 2021-06-23 ENCOUNTER — Ambulatory Visit: Payer: Medicaid Other | Attending: Pediatrics

## 2021-06-23 ENCOUNTER — Other Ambulatory Visit: Payer: Self-pay

## 2021-06-23 ENCOUNTER — Encounter: Payer: Self-pay | Admitting: *Deleted

## 2021-06-23 VITALS — BP 112/67 | HR 102

## 2021-06-23 DIAGNOSIS — O09293 Supervision of pregnancy with other poor reproductive or obstetric history, third trimester: Secondary | ICD-10-CM | POA: Diagnosis not present

## 2021-06-23 DIAGNOSIS — O0933 Supervision of pregnancy with insufficient antenatal care, third trimester: Secondary | ICD-10-CM | POA: Diagnosis not present

## 2021-06-23 DIAGNOSIS — E669 Obesity, unspecified: Secondary | ICD-10-CM

## 2021-06-23 DIAGNOSIS — Z6834 Body mass index (BMI) 34.0-34.9, adult: Secondary | ICD-10-CM

## 2021-06-23 DIAGNOSIS — Z3A33 33 weeks gestation of pregnancy: Secondary | ICD-10-CM | POA: Diagnosis not present

## 2021-06-23 DIAGNOSIS — Z362 Encounter for other antenatal screening follow-up: Secondary | ICD-10-CM | POA: Diagnosis not present

## 2021-06-23 DIAGNOSIS — O99213 Obesity complicating pregnancy, third trimester: Secondary | ICD-10-CM | POA: Diagnosis not present

## 2021-06-23 DIAGNOSIS — F419 Anxiety disorder, unspecified: Secondary | ICD-10-CM

## 2021-06-23 DIAGNOSIS — O0930 Supervision of pregnancy with insufficient antenatal care, unspecified trimester: Secondary | ICD-10-CM

## 2021-06-23 DIAGNOSIS — O0990 Supervision of high risk pregnancy, unspecified, unspecified trimester: Secondary | ICD-10-CM

## 2021-06-23 DIAGNOSIS — O98213 Gonorrhea complicating pregnancy, third trimester: Secondary | ICD-10-CM

## 2021-06-28 ENCOUNTER — Other Ambulatory Visit: Payer: Self-pay

## 2021-06-28 ENCOUNTER — Ambulatory Visit (INDEPENDENT_AMBULATORY_CARE_PROVIDER_SITE_OTHER): Payer: Medicaid Other | Admitting: Family Medicine

## 2021-06-28 ENCOUNTER — Encounter: Payer: Self-pay | Admitting: Family Medicine

## 2021-06-28 VITALS — BP 104/85 | HR 94 | Wt 231.5 lb

## 2021-06-28 DIAGNOSIS — O98213 Gonorrhea complicating pregnancy, third trimester: Secondary | ICD-10-CM

## 2021-06-28 DIAGNOSIS — O0990 Supervision of high risk pregnancy, unspecified, unspecified trimester: Secondary | ICD-10-CM

## 2021-06-28 DIAGNOSIS — F419 Anxiety disorder, unspecified: Secondary | ICD-10-CM

## 2021-06-28 DIAGNOSIS — O0930 Supervision of pregnancy with insufficient antenatal care, unspecified trimester: Secondary | ICD-10-CM

## 2021-06-28 NOTE — Progress Notes (Signed)
Patient has no questions or concerns for today. Stated that baby moves daily, denies any vaginal bleeding or pain

## 2021-06-28 NOTE — Patient Instructions (Signed)

## 2021-06-28 NOTE — Progress Notes (Signed)
   Subjective:  Erika Klein is a 25 y.o. G3P1011 at [redacted]w[redacted]d being seen today for ongoing prenatal care.  She is currently monitored for the following issues for this low-risk pregnancy and has Supervision of high risk pregnancy, antepartum; Anxiety; Depression; Late prenatal care; Gonorrhea in pregnancy, antepartum, third trimester; and [redacted] weeks gestation of pregnancy on their problem list.  Patient reports no complaints.  Contractions: Not present. Vag. Bleeding: None.  Movement: Present. Denies leaking of fluid.   The following portions of the patient's history were reviewed and updated as appropriate: allergies, current medications, past family history, past medical history, past social history, past surgical history and problem list. Problem list updated.  Objective:   Vitals:   06/28/21 1430  BP: 104/85  Pulse: 94  Weight: 231 lb 8 oz (105 kg)    Fetal Status: Fetal Heart Rate (bpm): 140   Movement: Present     General:  Alert, oriented and cooperative. Patient is in no acute distress.  Skin: Skin is warm and dry. No rash noted.   Cardiovascular: Normal heart rate noted  Respiratory: Normal respiratory effort, no problems with respiration noted  Abdomen: Soft, gravid, appropriate for gestational age. Pain/Pressure: Absent     Pelvic: Vag. Bleeding: None     Cervical exam deferred        Extremities: Normal range of motion.     Mental Status: Normal mood and affect. Normal behavior. Normal judgment and thought content.   Urinalysis:      Assessment and Plan:  Pregnancy: G3P1011 at [redacted]w[redacted]d  1. Supervision of high risk pregnancy, antepartum BP and FHR normal Review of chart shows needs pap PP Recommended TDaP today, declines Discussed contraception, unsure  2. Gonorrhea in pregnancy, antepartum, third trimester TOC next visit  3. Late prenatal care New OB at 31 weeks  4. Anxiety Not addressed  Preterm labor symptoms and general obstetric precautions including but  not limited to vaginal bleeding, contractions, leaking of fluid and fetal movement were reviewed in detail with the patient. Please refer to After Visit Summary for other counseling recommendations.  Return in 2 weeks (on 07/12/2021) for [facility], ob visit.   Venora Maples, MD

## 2021-07-03 ENCOUNTER — Encounter: Payer: Self-pay | Admitting: *Deleted

## 2021-07-06 DIAGNOSIS — Z419 Encounter for procedure for purposes other than remedying health state, unspecified: Secondary | ICD-10-CM | POA: Diagnosis not present

## 2021-07-14 ENCOUNTER — Other Ambulatory Visit: Payer: Self-pay

## 2021-07-14 ENCOUNTER — Ambulatory Visit (INDEPENDENT_AMBULATORY_CARE_PROVIDER_SITE_OTHER): Payer: Medicaid Other | Admitting: Certified Nurse Midwife

## 2021-07-14 ENCOUNTER — Other Ambulatory Visit (HOSPITAL_COMMUNITY)
Admission: RE | Admit: 2021-07-14 | Discharge: 2021-07-14 | Disposition: A | Discharge status: Home / Self Care | Payer: Medicaid Other | Source: Ambulatory Visit | Attending: Certified Nurse Midwife | Admitting: Certified Nurse Midwife

## 2021-07-14 VITALS — BP 121/62 | HR 98 | Wt 236.5 lb

## 2021-07-14 DIAGNOSIS — O0990 Supervision of high risk pregnancy, unspecified, unspecified trimester: Secondary | ICD-10-CM | POA: Diagnosis not present

## 2021-07-14 DIAGNOSIS — Z3A36 36 weeks gestation of pregnancy: Secondary | ICD-10-CM

## 2021-07-14 DIAGNOSIS — O09293 Supervision of pregnancy with other poor reproductive or obstetric history, third trimester: Secondary | ICD-10-CM

## 2021-07-14 LAB — OB RESULTS CONSOLE GC/CHLAMYDIA: Gonorrhea: NEGATIVE

## 2021-07-17 LAB — GC/CHLAMYDIA PROBE AMP (~~LOC~~) NOT AT ARMC
Chlamydia: NEGATIVE
Comment: NEGATIVE
Comment: NORMAL
Neisseria Gonorrhea: NEGATIVE

## 2021-07-17 NOTE — Progress Notes (Signed)
   PRENATAL VISIT NOTE  Subjective:  Erika Klein is a 25 y.o. G3P1011 at [redacted]w[redacted]d being seen today for ongoing prenatal care.  She is currently monitored for the following issues for this high-risk pregnancy and has Supervision of high risk pregnancy, antepartum; Anxiety; Depression; Late prenatal care; Gonorrhea in pregnancy, antepartum, third trimester; and [redacted] weeks gestation of pregnancy on their problem list.  Patient reports  vaginal pressure but no contractions or excessive pelvic pain .  Contractions: Irritability. Vag. Bleeding: None.  Movement: Present. Denies leaking of fluid.   The following portions of the patient's history were reviewed and updated as appropriate: allergies, current medications, past family history, past medical history, past social history, past surgical history and problem list.   Objective:   Vitals:   07/14/21 1108  BP: 121/62  Pulse: 98  Weight: 236 lb 8 oz (107.3 kg)    Fetal Status: Fetal Heart Rate (bpm): 144 Fundal Height: 37 cm Movement: Present     General:  Alert, oriented and cooperative. Patient is in no acute distress.  Skin: Skin is warm and dry. No rash noted.   Cardiovascular: Normal heart rate noted  Respiratory: Normal respiratory effort, no problems with respiration noted  Abdomen: Soft, gravid, appropriate for gestational age.  Pain/Pressure: Present     Pelvic: Cervical exam deferred        Extremities: Normal range of motion.  Edema: Trace  Mental Status: Normal mood and affect. Normal behavior. Normal judgment and thought content.   Assessment and Plan:  Pregnancy: G3P1011 at [redacted]w[redacted]d 1. Encounter for supervision of low-risk first pregnancy in third trimester - Doing well, feeling regular and vigorous fetal movement  2. [redacted] weeks gestation of pregnancy - Routine OB care including: - GC/Chlamydia probe amp ([place])not at [facility] - Culture, beta strep (group b only)  Preterm labor symptoms and general obstetric precautions  including but not limited to vaginal bleeding, contractions, leaking of fluid and fetal movement were reviewed in detail with the patient. Please refer to After Visit Summary for other counseling recommendations.   Return in about 1 week (around 07/21/2021) for IN-PERSON, LOB.  Future Appointments  Date Time Provider Department Center  07/21/2021  3:00 PM [facility] NURSE [facility]  07/21/2021  3:15 PM [facility] US2 [facility]    Bernerd Limbo, CNM

## 2021-07-18 ENCOUNTER — Other Ambulatory Visit: Payer: Self-pay | Admitting: Lactation Services

## 2021-07-18 LAB — CULTURE, BETA STREP (GROUP B ONLY): Strep Gp B Culture: NEGATIVE

## 2021-07-18 MED ORDER — TERCONAZOLE 0.4 % VA CREA
1.0000 | TOPICAL_CREAM | Freq: Every day | VAGINAL | 0 refills | Status: DC
Start: 2021-07-18 — End: 2021-08-18

## 2021-07-18 NOTE — Progress Notes (Signed)
Terazol sent to pharmacy as patient with s/s yeast. Patient notified via My Chart.

## 2021-07-21 ENCOUNTER — Ambulatory Visit: Payer: Medicaid Other | Attending: Obstetrics

## 2021-07-21 ENCOUNTER — Ambulatory Visit: Payer: Medicaid Other | Admitting: *Deleted

## 2021-07-21 ENCOUNTER — Other Ambulatory Visit: Payer: Self-pay

## 2021-07-21 ENCOUNTER — Encounter: Payer: Self-pay | Admitting: *Deleted

## 2021-07-21 VITALS — BP 115/63 | HR 91

## 2021-07-21 DIAGNOSIS — Z6834 Body mass index (BMI) 34.0-34.9, adult: Secondary | ICD-10-CM

## 2021-07-21 DIAGNOSIS — O99213 Obesity complicating pregnancy, third trimester: Secondary | ICD-10-CM | POA: Insufficient documentation

## 2021-07-21 DIAGNOSIS — O0990 Supervision of high risk pregnancy, unspecified, unspecified trimester: Secondary | ICD-10-CM

## 2021-07-21 DIAGNOSIS — O98213 Gonorrhea complicating pregnancy, third trimester: Secondary | ICD-10-CM

## 2021-07-21 DIAGNOSIS — O0933 Supervision of pregnancy with insufficient antenatal care, third trimester: Secondary | ICD-10-CM | POA: Insufficient documentation

## 2021-07-21 DIAGNOSIS — F419 Anxiety disorder, unspecified: Secondary | ICD-10-CM

## 2021-07-21 DIAGNOSIS — E669 Obesity, unspecified: Secondary | ICD-10-CM | POA: Diagnosis not present

## 2021-07-21 DIAGNOSIS — Z362 Encounter for other antenatal screening follow-up: Secondary | ICD-10-CM | POA: Insufficient documentation

## 2021-07-21 DIAGNOSIS — O0930 Supervision of pregnancy with insufficient antenatal care, unspecified trimester: Secondary | ICD-10-CM

## 2021-07-21 DIAGNOSIS — O09293 Supervision of pregnancy with other poor reproductive or obstetric history, third trimester: Secondary | ICD-10-CM | POA: Insufficient documentation

## 2021-07-21 DIAGNOSIS — F3289 Other specified depressive episodes: Secondary | ICD-10-CM

## 2021-07-21 DIAGNOSIS — Z3A37 37 weeks gestation of pregnancy: Secondary | ICD-10-CM | POA: Diagnosis not present

## 2021-08-05 DIAGNOSIS — Z419 Encounter for procedure for purposes other than remedying health state, unspecified: Secondary | ICD-10-CM | POA: Diagnosis not present

## 2021-08-11 ENCOUNTER — Inpatient Hospital Stay (HOSPITAL_COMMUNITY)
Admission: AD | Admit: 2021-08-11 | Discharge: 2021-08-11 | Disposition: A | Discharge status: Home / Self Care | Payer: Medicaid Other | Attending: Obstetrics and Gynecology | Admitting: Obstetrics and Gynecology

## 2021-08-11 ENCOUNTER — Encounter (HOSPITAL_COMMUNITY): Payer: Self-pay | Admitting: Obstetrics and Gynecology

## 2021-08-11 ENCOUNTER — Other Ambulatory Visit: Payer: Self-pay

## 2021-08-11 DIAGNOSIS — R519 Headache, unspecified: Secondary | ICD-10-CM

## 2021-08-11 DIAGNOSIS — O98213 Gonorrhea complicating pregnancy, third trimester: Secondary | ICD-10-CM | POA: Diagnosis not present

## 2021-08-11 DIAGNOSIS — O99891 Other specified diseases and conditions complicating pregnancy: Secondary | ICD-10-CM | POA: Diagnosis not present

## 2021-08-11 DIAGNOSIS — O0990 Supervision of high risk pregnancy, unspecified, unspecified trimester: Secondary | ICD-10-CM

## 2021-08-11 DIAGNOSIS — O99343 Other mental disorders complicating pregnancy, third trimester: Secondary | ICD-10-CM | POA: Insufficient documentation

## 2021-08-11 DIAGNOSIS — F32A Depression, unspecified: Secondary | ICD-10-CM | POA: Insufficient documentation

## 2021-08-11 DIAGNOSIS — O0933 Supervision of pregnancy with insufficient antenatal care, third trimester: Secondary | ICD-10-CM | POA: Insufficient documentation

## 2021-08-11 DIAGNOSIS — O0930 Supervision of pregnancy with insufficient antenatal care, unspecified trimester: Secondary | ICD-10-CM

## 2021-08-11 DIAGNOSIS — Z3A40 40 weeks gestation of pregnancy: Secondary | ICD-10-CM

## 2021-08-11 DIAGNOSIS — F3289 Other specified depressive episodes: Secondary | ICD-10-CM

## 2021-08-11 DIAGNOSIS — O26893 Other specified pregnancy related conditions, third trimester: Secondary | ICD-10-CM | POA: Diagnosis not present

## 2021-08-11 DIAGNOSIS — F419 Anxiety disorder, unspecified: Secondary | ICD-10-CM | POA: Diagnosis not present

## 2021-08-11 DIAGNOSIS — Z87891 Personal history of nicotine dependence: Secondary | ICD-10-CM | POA: Diagnosis not present

## 2021-08-11 LAB — CBC
HCT: 38.2 % (ref 36.0–46.0)
Hemoglobin: 12.5 g/dL (ref 12.0–15.0)
MCH: 30.5 pg (ref 26.0–34.0)
MCHC: 32.7 g/dL (ref 30.0–36.0)
MCV: 93.2 fL (ref 80.0–100.0)
Platelets: 192 10*3/uL (ref 150–400)
RBC: 4.1 MIL/uL (ref 3.87–5.11)
RDW: 13.2 % (ref 11.5–15.5)
WBC: 5.2 10*3/uL (ref 4.0–10.5)
nRBC: 0 % (ref 0.0–0.2)

## 2021-08-11 LAB — COMPREHENSIVE METABOLIC PANEL
ALT: 16 U/L (ref 0–44)
AST: 26 U/L (ref 15–41)
Albumin: 2.9 g/dL — ABNORMAL LOW (ref 3.5–5.0)
Alkaline Phosphatase: 158 U/L — ABNORMAL HIGH (ref 38–126)
Anion gap: 9 (ref 5–15)
BUN: 6 mg/dL (ref 6–20)
CO2: 19 mmol/L — ABNORMAL LOW (ref 22–32)
Calcium: 8.9 mg/dL (ref 8.9–10.3)
Chloride: 104 mmol/L (ref 98–111)
Creatinine, Ser: 0.71 mg/dL (ref 0.44–1.00)
GFR, Estimated: >60 mL/min (ref >60)
Glucose, Bld: 82 mg/dL (ref 70–99)
Potassium: 4 mmol/L (ref 3.5–5.1)
Sodium: 132 mmol/L — ABNORMAL LOW (ref 135–145)
Total Bilirubin: 0.5 mg/dL (ref 0.3–1.2)
Total Protein: 6.5 g/dL (ref 6.5–8.1)

## 2021-08-11 LAB — PROTEIN / CREATININE RATIO, URINE
Creatinine, Urine: 193.67 mg/dL
Protein Creatinine Ratio: 0.08 mg/mg{Cre} (ref 0.00–0.15)
Total Protein, Urine: 15 mg/dL

## 2021-08-11 MED ORDER — ACETAMINOPHEN 500 MG PO TABS
1000.0000 mg | ORAL_TABLET | Freq: Once | ORAL | Status: AC
  Administered 2021-08-11: 1000 mg via ORAL
  Filled 2021-08-11: qty 2

## 2021-08-11 NOTE — MAU Provider Note (Signed)
Chief Complaint:  Headache    HPI: Erika Klein is a 25 y.o. G3P1011 at 64w3dwho presents to maternity admissions reporting headache. Patient reports constant headache for 1 week. Headache is mostly on the sides and back of her head. Took 2 ES Tylenol on Tuesday, but none since because it did not work. Rates headache 5/10. She reports she does see white spots but this has been ongoing since the beginning of her pregnancy. She denies RUQ/epigastric pain, contractions, vaginal bleeding or leaking fluid. Endorses active fetal movement.  Of note, patient reports she has only had 1 cup of water to drink today, which was provided by nursing staff. She also reports she has not had anything to eat today.   Pregnancy Course:   Past Medical History:  Diagnosis Date   Anxiety    Asthma    Depression    OB History  Gravida Para Term Preterm AB Living  3 1 1   1 1   SAB IAB Ectopic Multiple Live Births    1     1    # Outcome Date GA Lbr Len/2nd Weight Sex Delivery Anes PTL Lv  3 Current           2 Term 01/16/19 [id number] g  Vag-Spont EPI  LIV     Birth Comments: ? IUGR  1 IAB 09/25/17             Birth Comments: was twin pregnancy, one died early and was given abortion pill because baby wasn't viable   Past Surgical History:  Procedure Laterality Date   anxiety     NO PAST SURGERIES     Family History  Problem Relation Age of Onset   Schizophrenia Mother    Bipolar disorder Mother    Anxiety disorder Mother    Cancer Mother    Cancer - Cervical Mother    Anxiety disorder Father    Asthma Father    Social History   Tobacco Use   Smoking status: Former    Types: Cigarettes   Smokeless tobacco: Never  Vaping Use   Vaping Use: Never used  Substance Use Topics   Alcohol use: Not Currently   Drug use: Never   No Known Allergies No medications prior to admission.   I have reviewed patient's Past Medical Hx, Surgical Hx, Family Hx, Social Hx, medications and allergies.    ROS:  Review of Systems  Constitutional: Negative.   Eyes:  Positive for visual disturbance (white spots).  Respiratory: Negative.    Cardiovascular: Negative.   Gastrointestinal: Negative.   Genitourinary: Negative.   Musculoskeletal: Negative.   Neurological:  Positive for headaches.   Physical Exam  Patient Vitals for the past 24 hrs:  BP Temp Pulse Resp SpO2 Height Weight  08/11/21 1730 118/68 -- 89 -- 99 % -- --  08/11/21 1715 111/67 -- 76 -- 99 % -- --  08/11/21 1700 127/70 -- 73 -- 99 % -- --  08/11/21 1645 123/73 -- 83 -- -- -- --  08/11/21 1630 115/76 -- 82 -- 97 % -- --  08/11/21 1615 117/75 -- 85 -- 96 % -- --  08/11/21 1610 -- -- -- -- 95 % -- --  08/11/21 1608 127/72 -- 89 -- -- -- --  08/11/21 1533 131/79 98.6 F (37 C) 98 18 -- 5' 3"  (1.6 m) 111.1 kg   Constitutional: well-developed, well-nourished female in no acute distress.  Cardiovascular: normal rate Respiratory: normal effort GI: abd  soft, non-tender, gravid  MS: extremities nontender, no edema, normal ROM Neurologic: alert and oriented x 4.  Psychiatric: normal mood, normal behavior, normal judgment     FHT: Baseline 130 bpm, moderate variability, 15x15 accelerations present, no decelerations Toco: occasional uc   Labs: Results for orders placed or performed during the hospital encounter of 08/11/21 (from the past 24 hour(s))  Protein / creatinine ratio, urine     Status: None   Collection Time: 08/11/21  3:40 PM  Result Value Ref Range   Creatinine, Urine 193.67 mg/dL   Total Protein, Urine 15 mg/dL   Protein Creatinine Ratio 0.08 0.00 - 0.15 mg/mg[Cre]  CBC     Status: None   Collection Time: 08/11/21  3:43 PM  Result Value Ref Range   WBC 5.2 4.0 - 10.5 K/uL   RBC 4.10 3.87 - 5.11 MIL/uL   Hemoglobin 12.5 12.0 - 15.0 g/dL   HCT 38.2 36.0 - 46.0 %   MCV 93.2 80.0 - 100.0 fL   MCH 30.5 26.0 - 34.0 pg   MCHC 32.7 30.0 - 36.0 g/dL   RDW 13.2 11.5 - 15.5 %   Platelets 192 150 - 400 K/uL    nRBC 0.0 0.0 - 0.2 %  Comprehensive metabolic panel     Status: Abnormal   Collection Time: 08/11/21  3:43 PM  Result Value Ref Range   Sodium 132 (L) 135 - 145 mmol/L   Potassium 4.0 3.5 - 5.1 mmol/L   Chloride 104 98 - 111 mmol/L   CO2 19 (L) 22 - 32 mmol/L   Glucose, Bld 82 70 - 99 mg/dL   BUN 6 6 - 20 mg/dL   Creatinine, Ser 0.71 0.44 - 1.00 mg/dL   Calcium 8.9 8.9 - 10.3 mg/dL   Total Protein 6.5 6.5 - 8.1 g/dL   Albumin 2.9 (L) 3.5 - 5.0 g/dL   AST 26 15 - 41 U/L   ALT 16 0 - 44 U/L   Alkaline Phosphatase 158 (H) 38 - 126 U/L   Total Bilirubin 0.5 0.3 - 1.2 mg/dL   GFR, Estimated >60 >60 mL/min   Anion gap 9 5 - 15    Imaging:    MAU Course: Orders Placed This Encounter  Procedures   CBC   Comprehensive metabolic panel   Protein / creatinine ratio, urine   Discharge patient   Meds ordered this encounter  Medications   acetaminophen (TYLENOL) tablet 1,000 mg    MDM: CBC, CMP, UCPR wnl Serial BP's all normotensive Tylenol 1070m PO. Headache resolved.  PO hydration NST reactive and reassuring   Assessment: 1. [redacted] weeks gestation of pregnancy   2. Supervision of high risk pregnancy, antepartum   3. Anxiety   4. Other depression   5. Late prenatal care   6. Gonorrhea in pregnancy, antepartum, third trimester   7. Headache in pregnancy, antepartum, third trimester     Plan: Discharge home in stable condition Strict return precautions reviewed Labor precautions and fetal kick counts Urgent message sent to CPeoriato schedule appointment ASAP    FMoose Wilson Roadfor W[place]at [facility] Follow up.   Specialty: Obstetrics and Gynecology Why: someone will contact you to schedule appointment ASAP. Contact information: [phone]               Allergies as of 08/11/2021   No Known Allergies  Medication List     TAKE these  medications    Blood Pressure Kit Devi 1 Device by Does not apply route as needed.   Gojji Weight Scale Misc 1 Device by Does not apply route as needed.   PrePLUS 27-1 MG Tabs Take 1 tablet by mouth daily.   terconazole 0.4 % vaginal cream Commonly known as: TERAZOL 7 Place 1 applicator vaginally at bedtime.         Maryagnes Amos, MSN, CNM 08/11/2021 6:42 PM

## 2021-08-11 NOTE — MAU Note (Signed)
Pt stated she has been having headache and blurred vision off and on for a couple of days. Was told to get evaluated yesterday but did not have child care. Just reports a slight headache today. Has not taken any tylenol for headache since Tuesday. Because it did not work. B/p has been WNL with her home cuff (baby [employer] app) good fetal movement ment felt. Reports some pelvic pressure off and on not sure if they are ctx.

## 2021-08-11 NOTE — Progress Notes (Signed)
Pt discharged in stable condition after discharge instructions given. Pt in stable condition at discharge. VSS.

## 2021-08-15 ENCOUNTER — Ambulatory Visit (INDEPENDENT_AMBULATORY_CARE_PROVIDER_SITE_OTHER): Payer: Medicaid Other | Admitting: Student

## 2021-08-15 ENCOUNTER — Other Ambulatory Visit: Payer: Self-pay

## 2021-08-15 VITALS — BP 116/78 | HR 84 | Wt 248.0 lb

## 2021-08-15 DIAGNOSIS — O480 Post-term pregnancy: Secondary | ICD-10-CM

## 2021-08-15 DIAGNOSIS — Z3A41 41 weeks gestation of pregnancy: Secondary | ICD-10-CM

## 2021-08-15 DIAGNOSIS — O0990 Supervision of high risk pregnancy, unspecified, unspecified trimester: Secondary | ICD-10-CM

## 2021-08-15 NOTE — Progress Notes (Signed)
   PRENATAL VISIT NOTE  Subjective:  Erika Klein is a 25 y.o. G3P1011 at [redacted]w[redacted]d being seen today for ongoing prenatal care.  She is currently monitored for the following issues for this low-risk pregnancy and has Supervision of high risk pregnancy, antepartum; Anxiety; Depression; Late prenatal care; and Gonorrhea in pregnancy, antepartum, third trimester on their problem list.  Patient reports no complaints. She reports that she has not been to visits because she "has not heard from the doctor". She denies any problems in this pregnancy, reports that her last pregnancy was complicated by FGR. So far, this fetus has measured appropriately. She denies any history of blood pressure or blood sugar issues in this pregnancy.  Contractions: Irritability. Vag. Bleeding: None.  Movement: Present. Denies leaking of fluid.   The following portions of the patient's history were reviewed and updated as appropriate: allergies, current medications, past family history, past medical history, past social history, past surgical history and problem list.   Objective:   Vitals:   08/15/21 1511  BP: 116/78  Pulse: 84  Weight: 248 lb (112.5 kg)    Fetal Status: Fetal Heart Rate (bpm): 138 Fundal Height: 41 cm Movement: Present     General:  Alert, oriented and cooperative. Patient is in no acute distress.  Skin: Skin is warm and dry. No rash noted.   Cardiovascular: Normal heart rate noted  Respiratory: Normal respiratory effort, no problems with respiration noted  Abdomen: Soft, gravid, appropriate for gestational age.  Pain/Pressure: Present     Pelvic: Cervical exam performed in the presence of a chaperone        Extremities: Normal range of motion.  Edema: Trace  Mental Status: Normal mood and affect. Normal behavior. Normal judgment and thought content.   Assessment and Plan:  Pregnancy: G3P1011 at [redacted]w[redacted]d    1. [redacted] weeks gestation of pregnancy   2. Supervision of high risk pregnancy, antepartum     -patient had NST today; reactive Cat 1 (see documentation in Fetal testing) -discussed with Optician, dispensing and Dr. Adrian Blackwater. Patient can be midnight IOL; she knows to show up at hospital at 11:45 pm tonight. She has been induced before (in [place]) and remembers having FB, pitocin and AROM. I discussed that her providers tonight will evaluate her and may offer cytotec for ripening but that otherwise the process is very similar. She did not have any questions.  -GBS is negative   Term labor symptoms and general obstetric precautions including but not limited to vaginal bleeding, contractions, leaking of fluid and fetal movement were reviewed in detail with the patient. Please refer to After Visit Summary for other counseling recommendations.   No follow-ups on file.  Future Appointments  Date Time Provider Department Center  08/16/2021 12:00 AM [facility] SCHED ROOM [facility] None    Marylene Land, [place]

## 2021-08-16 ENCOUNTER — Inpatient Hospital Stay (HOSPITAL_COMMUNITY)
Admission: AD | Admit: 2021-08-16 | Discharge: 2021-08-18 | DRG: 807 | Disposition: A | Discharge status: Home / Self Care | Payer: Medicaid Other | Attending: Obstetrics and Gynecology | Admitting: Obstetrics and Gynecology

## 2021-08-16 ENCOUNTER — Encounter (HOSPITAL_COMMUNITY): Payer: Self-pay | Admitting: Student

## 2021-08-16 ENCOUNTER — Inpatient Hospital Stay (HOSPITAL_COMMUNITY): Payer: Medicaid Other

## 2021-08-16 DIAGNOSIS — O0930 Supervision of pregnancy with insufficient antenatal care, unspecified trimester: Secondary | ICD-10-CM

## 2021-08-16 DIAGNOSIS — Z20822 Contact with and (suspected) exposure to covid-19: Secondary | ICD-10-CM | POA: Diagnosis present

## 2021-08-16 DIAGNOSIS — Z3A41 41 weeks gestation of pregnancy: Secondary | ICD-10-CM

## 2021-08-16 DIAGNOSIS — O480 Post-term pregnancy: Secondary | ICD-10-CM | POA: Diagnosis not present

## 2021-08-16 DIAGNOSIS — Z87891 Personal history of nicotine dependence: Secondary | ICD-10-CM | POA: Diagnosis not present

## 2021-08-16 DIAGNOSIS — O99893 Other specified diseases and conditions complicating puerperium: Secondary | ICD-10-CM | POA: Diagnosis not present

## 2021-08-16 DIAGNOSIS — O98213 Gonorrhea complicating pregnancy, third trimester: Secondary | ICD-10-CM | POA: Diagnosis present

## 2021-08-16 DIAGNOSIS — F419 Anxiety disorder, unspecified: Secondary | ICD-10-CM | POA: Diagnosis present

## 2021-08-16 DIAGNOSIS — R030 Elevated blood-pressure reading, without diagnosis of hypertension: Secondary | ICD-10-CM | POA: Diagnosis not present

## 2021-08-16 DIAGNOSIS — F32A Depression, unspecified: Secondary | ICD-10-CM | POA: Diagnosis present

## 2021-08-16 LAB — COMPREHENSIVE METABOLIC PANEL
ALT: 14 U/L (ref 0–44)
AST: 20 U/L (ref 15–41)
Albumin: 2.7 g/dL — ABNORMAL LOW (ref 3.5–5.0)
Alkaline Phosphatase: 148 U/L — ABNORMAL HIGH (ref 38–126)
Anion gap: 8 (ref 5–15)
BUN: 10 mg/dL (ref 6–20)
CO2: 23 mmol/L (ref 22–32)
Calcium: 8.9 mg/dL (ref 8.9–10.3)
Chloride: 104 mmol/L (ref 98–111)
Creatinine, Ser: 0.79 mg/dL (ref 0.44–1.00)
GFR, Estimated: >60 mL/min (ref >60)
Glucose, Bld: 70 mg/dL (ref 70–99)
Potassium: 3.5 mmol/L (ref 3.5–5.1)
Sodium: 135 mmol/L (ref 135–145)
Total Bilirubin: 0.7 mg/dL (ref 0.3–1.2)
Total Protein: 6 g/dL — ABNORMAL LOW (ref 6.5–8.1)

## 2021-08-16 LAB — RESP PANEL BY RT-PCR (FLU A&B, COVID) ARPGX2
Influenza A by PCR: NEGATIVE
Influenza B by PCR: NEGATIVE
SARS Coronavirus 2 by RT PCR: NEGATIVE

## 2021-08-16 LAB — PROTEIN / CREATININE RATIO, URINE
Creatinine, Urine: 44.77 mg/dL
Total Protein, Urine: <6 mg/dL

## 2021-08-16 LAB — CBC
HCT: 39 % (ref 36.0–46.0)
Hemoglobin: 12.6 g/dL (ref 12.0–15.0)
MCH: 30.5 pg (ref 26.0–34.0)
MCHC: 32.3 g/dL (ref 30.0–36.0)
MCV: 94.4 fL (ref 80.0–100.0)
Platelets: 194 10*3/uL (ref 150–400)
RBC: 4.13 MIL/uL (ref 3.87–5.11)
RDW: 13.3 % (ref 11.5–15.5)
WBC: 4.8 10*3/uL (ref 4.0–10.5)
nRBC: 0 % (ref 0.0–0.2)

## 2021-08-16 LAB — TYPE AND SCREEN
ABO/RH(D): O POS
Antibody Screen: NEGATIVE

## 2021-08-16 MED ORDER — ONDANSETRON HCL 4 MG PO TABS: 4.0000 mg | ORAL_TABLET | ORAL | Status: DC | PRN

## 2021-08-16 MED ORDER — ACETAMINOPHEN 325 MG PO TABS: 650.0000 mg | ORAL_TABLET | ORAL | Status: DC | PRN

## 2021-08-16 MED ORDER — LACTATED RINGERS IV SOLN: INTRAVENOUS | Status: DC

## 2021-08-16 MED ORDER — MISOPROSTOL 50MCG HALF TABLET
50.0000 ug | ORAL_TABLET | ORAL | Status: DC | PRN
  Administered 2021-08-16: 50 ug via BUCCAL

## 2021-08-16 MED ORDER — IBUPROFEN 600 MG PO TABS
600.0000 mg | ORAL_TABLET | Freq: Four times a day (QID) | ORAL | Status: DC
  Administered 2021-08-17: 600 mg via ORAL
  Filled 2021-08-16 (×3): qty 1

## 2021-08-16 MED ORDER — TETANUS-DIPHTH-ACELL PERTUSSIS 5-2.5-18.5 LF-MCG/0.5 IM SUSY: 0.5000 mL | PREFILLED_SYRINGE | Freq: Once | INTRAMUSCULAR | Status: DC

## 2021-08-16 MED ORDER — OXYTOCIN BOLUS FROM INFUSION
333.0000 mL | Freq: Once | INTRAVENOUS | Status: AC
  Administered 2021-08-16: 333 mL via INTRAVENOUS

## 2021-08-16 MED ORDER — OXYTOCIN-SODIUM CHLORIDE 30-0.9 UT/500ML-% IV SOLN
2.5000 [IU]/h | INTRAVENOUS | Status: DC
  Administered 2021-08-16: 2.5 [IU]/h via INTRAVENOUS
  Filled 2021-08-16: qty 500

## 2021-08-16 MED ORDER — LIDOCAINE HCL (PF) 1 % IJ SOLN
30.0000 mL | INTRAMUSCULAR | Status: DC | PRN
  Administered 2021-08-16: 30 mL via SUBCUTANEOUS
  Filled 2021-08-16: qty 30

## 2021-08-16 MED ORDER — PRENATAL MULTIVITAMIN CH
1.0000 | ORAL_TABLET | Freq: Every day | ORAL | Status: DC
  Filled 2021-08-16: qty 1

## 2021-08-16 MED ORDER — MEASLES, MUMPS & RUBELLA VAC IJ SOLR: 0.5000 mL | Freq: Once | INTRAMUSCULAR | Status: DC

## 2021-08-16 MED ORDER — MISOPROSTOL 25 MCG QUARTER TABLET
25.0000 ug | ORAL_TABLET | ORAL | Status: DC | PRN
  Administered 2021-08-16: 25 ug via VAGINAL
  Filled 2021-08-16: qty 1

## 2021-08-16 MED ORDER — LACTATED RINGERS IV SOLN: 500.0000 mL | INTRAVENOUS | Status: DC | PRN

## 2021-08-16 MED ORDER — COCONUT OIL OIL: 1.0000 "application " | TOPICAL_OIL | Status: DC | PRN

## 2021-08-16 MED ORDER — MISOPROSTOL 50MCG HALF TABLET
ORAL_TABLET | ORAL | Status: AC
  Filled 2021-08-16: qty 1

## 2021-08-16 MED ORDER — SOD CITRATE-CITRIC ACID 500-334 MG/5ML PO SOLN: 30.0000 mL | ORAL | Status: DC | PRN

## 2021-08-16 MED ORDER — ONDANSETRON HCL 4 MG/2ML IJ SOLN: 4.0000 mg | Freq: Four times a day (QID) | INTRAMUSCULAR | Status: DC | PRN

## 2021-08-16 MED ORDER — MEDROXYPROGESTERONE ACETATE 150 MG/ML IM SUSP: 150.0000 mg | INTRAMUSCULAR | Status: DC | PRN

## 2021-08-16 MED ORDER — TERBUTALINE SULFATE 1 MG/ML IJ SOLN: 0.2500 mg | Freq: Once | INTRAMUSCULAR | Status: DC | PRN

## 2021-08-16 MED ORDER — DIPHENHYDRAMINE HCL 25 MG PO CAPS: 25.0000 mg | ORAL_CAPSULE | Freq: Four times a day (QID) | ORAL | Status: DC | PRN

## 2021-08-16 MED ORDER — DIBUCAINE (PERIANAL) 1 % EX OINT: 1.0000 "application " | TOPICAL_OINTMENT | CUTANEOUS | Status: DC | PRN

## 2021-08-16 MED ORDER — OXYCODONE-ACETAMINOPHEN 5-325 MG PO TABS: 1.0000 | ORAL_TABLET | ORAL | Status: DC | PRN

## 2021-08-16 MED ORDER — BENZOCAINE-MENTHOL 20-0.5 % EX AERO
1.0000 "application " | INHALATION_SPRAY | CUTANEOUS | Status: DC | PRN
  Filled 2021-08-16: qty 56

## 2021-08-16 MED ORDER — WITCH HAZEL-GLYCERIN EX PADS: 1.0000 "application " | MEDICATED_PAD | CUTANEOUS | Status: DC | PRN

## 2021-08-16 MED ORDER — SIMETHICONE 80 MG PO CHEW: 80.0000 mg | CHEWABLE_TABLET | ORAL | Status: DC | PRN

## 2021-08-16 MED ORDER — SENNOSIDES-DOCUSATE SODIUM 8.6-50 MG PO TABS
2.0000 | ORAL_TABLET | Freq: Every day | ORAL | Status: DC
  Filled 2021-08-16: qty 2

## 2021-08-16 MED ORDER — ONDANSETRON HCL 4 MG/2ML IJ SOLN: 4.0000 mg | INTRAMUSCULAR | Status: DC | PRN

## 2021-08-16 MED ORDER — OXYCODONE-ACETAMINOPHEN 5-325 MG PO TABS: 2.0000 | ORAL_TABLET | ORAL | Status: DC | PRN

## 2021-08-16 NOTE — Lactation Note (Signed)
This note was copied from a baby's chart. Lactation Consultation Note  Patient Name: Erika Klein DGLOV'F Date: 08/16/2021 Reason for consult: L&D Initial assessment Age:24 hours  L&D consult with >60 minutes old infant and P2 mother. Congratulated family on newborn.  Assisted with skin to skin prone on mother's chest. Offered assistance with latch, laid back position. Several attempts to latch unsuccessful. Assisted with hand expression and spoonfed. Discussed STS as ideal transition for infants after birth helping with temperature, blood sugar and comfort. Talked about primal reflexes such as rooting, hands to mouth, searching for the breast among others. Explained LC services availability during postpartum stay. Thanked family for their time.      Maternal Data Has patient been taught Hand Expression?: Yes Does the patient have breastfeeding experience prior to this delivery?: Yes How long did the patient breastfeed?: 12 weeks  Feeding Mother's Current Feeding Choice: Breast Milk and Formula  LATCH Score Latch: Repeated attempts needed to sustain latch, nipple held in mouth throughout feeding, stimulation needed to elicit sucking reflex.  Audible Swallowing: None  Type of Nipple: Everted at rest and after stimulation (short shafted)  Comfort (Breast/Nipple): Soft / non-tender  Hold (Positioning): Assistance needed to correctly position infant at breast and maintain latch.  LATCH Score: 6  Interventions Interventions: Assisted with latch;Skin to skin;Hand express;Breast massage;Expressed milk;Position options;Education  Discharge WIC Program: Yes  Consult Status Consult Status: Follow-up from L&D Date: 08/17/21 Follow-up type: In-patient    Pradyun Ishman A Higuera Ancidey 08/16/2021, 10:17 PM

## 2021-08-16 NOTE — H&P (Addendum)
OBSTETRIC ADMISSION HISTORY AND PHYSICAL  Secret Kristensen is a 25 y.o. female G3P1011 with IUP at 42w1dby 29wk [place]ore[place] for IOL for postdates. She reports +FMs, No LOF, no VB, no blurry vision, headaches or peripheral edema, and RUQ pain.  She plans on breast and bottle feeding. She request condoms for birth control. [facility]  Dating: By 29wk [place]--->  Estimated Date of Delivery: 08/08/21  Sono:   _0 , CWD, normal anatomy, breech presentation, placenta anterior, 1408g, 45% EFW   Prenatal History/Complications:  late to prenatal care gonorrhea in 3rd trimester, s/p treatment (TOC negative on 07/14/21)  Past Medical History: Past Medical History:  Diagnosis Date   Anxiety    Asthma    Depression     Past Surgical History: Past Surgical History:  Procedure Laterality Date   anxiety     NO PAST SURGERIES      Obstetrical History: OB History     Gravida  3   Para  1   Term  1   Preterm      AB  1   Living  1      SAB      IAB  1   Ectopic      Multiple      Live Births  1           Social History Social History   Socioeconomic History   Marital status: Single    Spouse name: Not on file   Number of children: Not on file   Years of education: Not on file   Highest education level: Not on file  Occupational History   Not on file  Tobacco Use   Smoking status: Former    Types: Cigarettes   Smokeless tobacco: Never  Vaping Use   Vaping Use: Never used  Substance and Sexual Activity   Alcohol use: Not Currently   Drug use: Never   Sexual activity: Yes    Birth control/protection: None  Other Topics Concern   Not on file  Social History Narrative   Not on file   Social Determinants of Health   Financial Resource Strain: Not on file  Food Insecurity: Food Insecurity Present   Worried About RCharity fundraiserin the Last Year: Often true   RArboriculturistin the Last Year: Sometimes true   Transportation Needs: Unmet Transportation Needs   Lack of Transportation (Medical): Yes   Lack of Transportation (Non-Medical): Yes  Physical Activity: Not on file  Stress: Not on file  Social Connections: Not on file    Family History: Family History  Problem Relation Age of Onset   Schizophrenia Mother    Bipolar disorder Mother    Anxiety disorder Mother    Cancer Mother    Cancer - Cervical Mother    Anxiety disorder Father    Asthma Father     Allergies: No Known Allergies  Medications Prior to Admission  Medication Sig Dispense Refill Last Dose   Blood Pressure Monitoring (BLOOD PRESSURE KIT) DEVI 1 Device by Does not apply route as needed. 1 each 0 08/15/2021   Misc. Devices (GOJJI WEIGHT SCALE) MISC 1 Device by Does not apply route as needed. 1 each 0 08/15/2021   Prenatal Vit-Fe Fumarate-FA (PREPLUS) 27-1 MG TABS Take 1 tablet by mouth daily. 30 tablet 11 Past Month   terconazole (TERAZOL 7) 0.4 % vaginal cream Place 1 applicator vaginally at bedtime. (Patient  not taking: No sig reported) 45 g 0 More than a month     Review of Systems   All systems reviewed and negative except as stated in HPI  Blood pressure 132/69, pulse 72, resp. rate 16, last menstrual period 11/01/2020. General appearance: alert, cooperative, and no distress Lungs: clear to auscultation bilaterally Heart: regular rate and rhythm Abdomen: soft, non-tender; bowel sounds normal Pelvic: cervical exam below Extremities: no pedal edema or signs of DVT Presentation: cephalic Fetal monitoringBaseline: 130 bpm, Variability: Good {> 6 bpm), Accelerations: Reactive, and Decelerations: Absent Uterine activity: Irritability. No regular contractions Dilation: Fingertip Effacement (%): Thick Station: -3 Exam by:: Ginger Carne, RN   Prenatal labs: ABO, Rh: --/--/O POS (10/12 1212) Antibody: NEG (10/12 1212) Rubella: 2.34 (08/08 1604) RPR: Non Reactive (08/08 1604)  HBsAg: Negative (08/08 1604)   HIV: Non Reactive (08/08 1604)  GBS: Negative/-- (09/09 1200)  1 hr Glucola passed early third trimester Genetic screening  not performed, late to care at 31 wks Anatomy US normal  Prenatal Transfer Tool  Maternal Diabetes: No Genetic Screening: none, late entry to care at 31 weeks Maternal Ultrasounds/Referrals: Normal Fetal Ultrasounds or other Referrals:  None Maternal Substance Abuse:  No Significant Maternal Medications:  None Significant Maternal Lab Results: Group B Strep negative  Results for orders placed or performed during the hospital encounter of 08/16/21 (from the past 24 hour(s))  Type and screen   Collection Time: 08/16/21 12:12 PM  Result Value Ref Range   ABO/RH(D) O POS    Antibody Screen NEG    Sample Expiration      08/19/2021,2359 Performed at [facility], [place] [address], [place], [place] [address]   CBC   Collection Time: 08/16/21 12:15 PM  Result Value Ref Range   WBC 4.8 4.0 - 10.5 K/uL   RBC 4.13 3.87 - 5.11 MIL/uL   Hemoglobin 12.6 12.0 - 15.0 g/dL   HCT 39.0 36.0 - 46.0 %   MCV 94.4 80.0 - 100.0 fL   MCH 30.5 26.0 - 34.0 pg   MCHC 32.3 30.0 - 36.0 g/dL   RDW 13.3 11.5 - 15.5 %   Platelets 194 150 - 400 K/uL   nRBC 0.0 0.0 - 0.2 %  Resp Panel by RT-PCR (Flu A&B, Covid) Nasopharyngeal Swab   Collection Time: 08/16/21 12:52 PM   Specimen: Nasopharyngeal Swab; Nasopharyngeal(NP) swabs in vial transport medium  Result Value Ref Range   SARS Coronavirus 2 by RT PCR NEGATIVE NEGATIVE   Influenza A by PCR NEGATIVE NEGATIVE   Influenza B by PCR NEGATIVE NEGATIVE  Comprehensive metabolic panel   Collection Time: 08/16/21  1:12 PM  Result Value Ref Range   Sodium 135 135 - 145 mmol/L   Potassium 3.5 3.5 - 5.1 mmol/L   Chloride 104 98 - 111 mmol/L   CO2 23 22 - 32 mmol/L   Glucose, Bld 70 70 - 99 mg/dL   BUN 10 6 - 20 mg/dL   Creatinine, Ser 0.79 0.44 - 1.00 mg/dL   Calcium 8.9 8.9 - 10.3 mg/dL   Total Protein 6.0 (L) 6.5 - 8.1 g/dL    Albumin 2.7 (L) 3.5 - 5.0 g/dL   AST 20 15 - 41 U/L   ALT 14 0 - 44 U/L   Alkaline Phosphatase 148 (H) 38 - 126 U/L   Total Bilirubin 0.7 0.3 - 1.2 mg/dL   GFR, Estimated >60 >60 mL/min   Anion gap 8 5 - 15  Protein /  creatinine ratio, urine   Collection Time: 08/16/21  2:00 PM  Result Value Ref Range   Creatinine, Urine 44.77 mg/dL   Total Protein, Urine <6 mg/dL   Protein Creatinine Ratio        0.00 - 0.15 mg/mg[Cre]    Patient Active Problem List   Diagnosis Date Noted   Indication for care in labor and delivery, antepartum 08/16/2021   Gonorrhea in pregnancy, antepartum, third trimester 05/23/2021   Supervision of high risk pregnancy, antepartum 05/15/2021   Anxiety    Depression    Late prenatal care     Assessment/Plan:  Kimmy Totten is a 25 y.o. G3P1011 at 14w1dhere for IOL for postdates  #Labor: Not in labor. Cervix is fingertip open. 25 mcg vaginal cytotec given now. Consider foley bulb at next check in 4 hours #Pain: Prefers no medicine, no epidural. Will consider epidural if pain too intense. #FWB: Cat 1 #ID:  GBS neg #MOF: breast and bottle #MOC: condoms #Circ:  N/A  ALattie Corns PGY-1, Faculty service 08/16/2021, 3:42 PM   GME ATTESTATION:  I saw and evaluated the patient. I agree with the findings and the plan of care as documented in the resident's note and have made necessary changes as appropriate.  ARenard Matter MD, MPH OB Fellow, [place]10/10/2021 3:43 PM

## 2021-08-16 NOTE — Discharge Summary (Signed)
Postpartum Discharge Summary      Patient Name: Erika Klein DOB: 04-08-1996 MRN: [id number]  Date of admission: 08/16/2021 Delivery date:08/16/2021  Delivering provider: Genia Del  Date of discharge: 08/18/2021  Admitting diagnosis: Indication for care in labor and delivery, antepartum [O75.9] Intrauterine pregnancy: [redacted]w[redacted]d    Secondary diagnosis:  Principal Problem:   Vaginal delivery Active Problems:   Anxiety   Depression   Late prenatal care   Gonorrhea in pregnancy, antepartum, third trimester   Elevated blood pressure reading without diagnosis of hypertension  Additional problems: None     Discharge diagnosis: Term Pregnancy Delivered                                              Post partum procedures:none Augmentation: Cytotec and IP Foley Complications: None  Hospital course: Induction of Labor With Vaginal Delivery   25y.o. yo G3P1011 at 428w1das admitted to the hospital 08/16/2021 for induction of labor.  Indication for induction: Postdates.  Patient had an uncomplicated labor course as follows: Membrane Rupture Time/Date: 9:13 PM ,08/16/2021   Delivery Method:Vaginal, Spontaneous  Episiotomy: None  Lacerations:  Periurethral  Details of delivery can be found in separate delivery note.  Patient had a routine postpartum course. Patient is discharged home 08/18/21.  Newborn Data: Birth date:08/16/2021  Birth time:9:13 PM  Gender:Female  Living status:Living  Apgars:8 ,9  Weight:3291 g   Magnesium Sulfate received: No BMZ received: No Rhophylac: N/A MMR: N/A - Immune  T-DaP: Declined Flu: Declined  Transfusion: No  Physical exam  Vitals:   08/17/21 0855 08/17/21 1400 08/17/21 2110 08/18/21 0533  BP: 131/78 129/63 (!) 124/53 113/63  Pulse: 87 77 69 64  Resp: 17 19 18 19   Temp: 98.3 F (36.8 C) 98.5 F (36.9 C) 98.7 F (37.1 C) 97.8 F (36.6 C)  TempSrc: Oral Oral Oral Oral  SpO2: 99% 100% 100% 100%   General: alert,  cooperative, and no distress Lochia: appropriate Uterine Fundus: firm Incision: N/A DVT Evaluation: No evidence of DVT seen on physical exam. Negative Homan's sign. No cords or calf tenderness. No significant calf/ankle edema.  Labs: Lab Results  Component Value Date   WBC 4.8 08/16/2021   HGB 12.6 08/16/2021   HCT 39.0 08/16/2021   MCV 94.4 08/16/2021   PLT 194 08/16/2021   CMP Latest Ref Rng & Units 08/16/2021  Glucose 70 - 99 mg/dL 70  BUN 6 - 20 mg/dL 10  Creatinine 0.44 - 1.00 mg/dL 0.79  Sodium 135 - 145 mmol/L 135  Potassium 3.5 - 5.1 mmol/L 3.5  Chloride 98 - 111 mmol/L 104  CO2 22 - 32 mmol/L 23  Calcium 8.9 - 10.3 mg/dL 8.9  Total Protein 6.5 - 8.1 g/dL 6.0(L)  Total Bilirubin 0.3 - 1.2 mg/dL 0.7  Alkaline Phos 38 - 126 U/L 148(H)  AST 15 - 41 U/L 20  ALT 0 - 44 U/L 14   Edinburgh Score: Edinburgh Postnatal Depression Scale Screening Tool 08/17/2021  I have been able to laugh and see the funny side of things. 0  I have looked forward with enjoyment to things. 0  I have blamed myself unnecessarily when things went wrong. 1  I have been anxious or worried for no good reason. 1  I have felt scared or panicky for no good reason. 2  Things  have been getting on top of me. 0  I have been so unhappy that I have had difficulty sleeping. 0  I have felt sad or miserable. 0  I have been so unhappy that I have been crying. 1  The thought of harming myself has occurred to me. 0  Edinburgh Postnatal Depression Scale Total 5     After visit meds:  Allergies as of 08/18/2021   No Known Allergies      Medication List     STOP taking these medications    Blood Pressure Kit Devi   Gojji Weight Scale Misc   terconazole 0.4 % vaginal cream Commonly known as: TERAZOL 7       TAKE these medications    acetaminophen 325 MG tablet Commonly known as: Tylenol Take 2 tablets (650 mg total) by mouth every 4 (four) hours as needed for moderate pain or mild pain  (for pain scale < 4).   ibuprofen 600 MG tablet Commonly known as: ADVIL Take 1 tablet (600 mg total) by mouth every 6 (six) hours.   PrePLUS 27-1 MG Tabs Take 1 tablet by mouth daily.         Discharge home in stable condition Infant Feeding: Bottle Infant Disposition:home with mother Discharge instruction: per After Visit Summary and Postpartum booklet. Activity: Advance as tolerated. Pelvic rest for 6 weeks.  Diet: routine diet Future Appointments: Future Appointments  Date Time Provider [place]  08/24/2021  9:00 AM [facility] NURSE [facility]  09/18/2021 10:35 AM Chancy Milroy, MD [facility]   Follow up Visit:  [place] [facility] Follow up on 08/24/2021.   Specialty: Obstetrics and Gynecology Why: for blood pressure check (can be virtual) Contact information: [place] [id number] [phone]               Message sent to [facility] by Dr. Gwenlyn Perking on 08/16/21.  Please schedule this patient for a In person postpartum visit in 4 weeks with the following provider: Any provider. Additional Postpartum F/U: BP check 1 week  Low risk pregnancy complicated by:  Late prenatal care, elevated BP intrapartum Delivery mode:  Vaginal, Spontaneous  Anticipated Birth Control:  Condoms   08/18/2021 Christin Fudge, CNM

## 2021-08-16 NOTE — Progress Notes (Signed)
Labor Progress Note Erika Klein is a 25 y.o. G3P1011 at [redacted]w[redacted]d presented for IOL for postdates S: Feeling well with no concerns. Not feeling any painful contractions.  O:  BP 139/73 (BP Location: Right Arm)   Pulse 68   Resp 17   LMP 11/01/2020  EFM: 120/good variability/no accels/no decels  CVE: Dilation: 2 Effacement (%): 50 Cervical Position: Posterior Station: -3 Presentation: Vertex Exam by:: Dr. Ephriam Jenkins   A&P: 25 y.o. H6O3729 [redacted]w[redacted]d IOL for PD #Labor: Progressing well. Foley bulb placed. buccal cytotec given. #Pain: well-controlled. Open to epidural. Prefers to avoid IV pain meds or epidural if possible.  #FWB: Cat1 #GBS negative  Mabeline Caras, PGY-1, Faculty Service 5:43 PM

## 2021-08-17 LAB — RPR: RPR Ser Ql: NONREACTIVE

## 2021-08-17 NOTE — Progress Notes (Addendum)
POSTPARTUM PROGRESS NOTE  Post Partum Day 1  Subjective:  Erika Klein is a 25 y.o. N2T5573 s/p SVD at [redacted]w[redacted]d.  No acute events overnight.  Pt denies problems with ambulating, voiding or po intake.  She denies nausea or vomiting.  Pain is well controlled.  She has had flatus. She has not had bowel movement.  Lochia Minimal.   Objective: Blood pressure 132/63, pulse 69, temperature 98.9 F (37.2 C), temperature source Axillary, resp. rate 17, last menstrual period 11/01/2020, SpO2 97 %.  Physical Exam:  General: alert, cooperative and no distress Chest: no respiratory distress Heart: regular rate, distal pulses intact Abdomen: soft, nontender Uterine Fundus: firm, appropriately tender DVT Evaluation: No calf swelling or tenderness Extremities: No edema Skin: warm, dry  Recent Labs    08/16/21 1215  HGB 12.6  HCT 39.0    Assessment/Plan: Erika Klein is a 25 y.o. U2G2542 s/p SVD at [redacted]w[redacted]d.  PPD#1 - Doing well, continue routine postpartum care.   Elevated BP: - Pressures elevated intrapartum - Improving postpartum; asymptomatic  - Will continue to monitor - Plan to add Procardia if BP is > 135/85 on future checks  Contraception: Condoms Feeding: Bottle Dispo: Plan for discharge tomorrow   LOS: 1 day   Melven Sartorius, [place] Neighbor 08/17/2021, 7:47 AM    Patient ID: Erika Klein, female   DOB: [birth year], 25 y.o.   MRN: [id number]  GME ATTESTATION:  I saw and evaluated the patient. I agree with the findings and the plan of care as documented in the student's note. I have made changes to documentation as necessary.  Progressing well postpartum and meeting milestones. BP improving after delivery. Will closely monitor and add Procardia as necessary. Asymptomatic. Plan for discharge PPD#2.   Evalina Field, MD OB Fellow, Faculty [facility], Center for [facility] 08/17/2021 9:27 AM

## 2021-08-17 NOTE — Clinical Social Work Maternal (Signed)
CLINICAL SOCIAL WORK MATERNAL/CHILD NOTE  Patient Details  Name: Erika Klein MRN: [id number] Date of Birth: 06/25/1996  Date:  08/17/2021  Clinical Social Worker Initiating Note:  Kellan Boehlke, LCSW Date/Time: Initiated:  08/17/21/1439     Child's Name:  Erika Klein   Biological Parents:  Mother, Father (Father: Jamere Klein (07/01/1997))   Need for Interpreter:  None   Reason for Referral:  Late or No Prenatal Care  , Other (Comment) (Transportation needs)   Address:  [address] [place] [address]    Phone number:  [phone] (home)     Additional phone number:   Household Members/Support Persons (HM/SP):   Household Member/Support Person 1, Household Member/Support Person 2   HM/SP Name Relationship DOB or Age  HM/SP -1 Terry Brooks mom    HM/SP -2 Ky'leem Klein son 01/16/19  HM/SP -3        HM/SP -4        HM/SP -5        HM/SP -6        HM/SP -7        HM/SP -8          Natural Supports (not living in the home):  Spouse/significant other   Professional Supports: None   Employment: Unemployed   Type of Work:     Education:  9 to 11 years (11th Grade)   Homebound arranged: No  Financial Resources:  Medicaid   Other Resources:  WIC, Food Stamps     Cultural/Religious Considerations Which May Impact Care:    Strengths:  Ability to meet basic needs  , Pediatrician chosen, Home prepared for child     Psychotropic Medications:         Pediatrician:    [place] area  Pediatrician List:   [place]    [place]  ([facility])  [place]    [place]    [place]    [place]      Pediatrician Fax Number:    Risk Factors/Current Problems:  None   Cognitive State:  Able to Concentrate  , Alert  , Goal Oriented  , Insightful  , Linear Thinking     Mood/Affect:  Comfortable  , Interested  , Calm  , Relaxed     CSW Assessment: CSW met with MOB at bedside to complete psychosocial assessment.  CSW introduced self and explained role. MOB was welcoming, open, and remained engaged during assessment. MOB reported that she resides with her mom and son. MOB reported that she receives both WIC and food stamps. MOB reported that she has all items needed to care for infant including a car seat and crib. CSW inquired about MOB's transportation needs, MOB reported that she is signed up for [place] transportation which has been helpful. CSW provided MOB with Medicaid transportation information for transportation to infant's appointments. CSW also encouraged MOB to contact [place] transportation to inquire about transportation to infant's appointments.   CSW inquired about MOB's mental health history. MOB reported that she was diagnosed with Depression 5 years ago. MOB reported that she hasn't had any depressive symptoms in the last year. MOB reported that she experienced postpartum depression, which started when her son turned one and lasted a couple months. MOB described her postpartum depression as being very depressed, not wanting to do anything, isolating and being tearful. MOB reported that she did not take any medication and the symptoms gradually went away on it's own. MOB shared   that she is interested in counseling resources, noting she feels the issue was she didn't have anyone to talk to. CSW provided MOB with local perinatal mental health resources. MOB presented calm and did not demonstrate any acute mental health signs/symptoms. CSW assessed for safety, MOB denied SI, HI, and domestic violence.   CSW provided education regarding the baby blues period vs. perinatal mood disorders, discussed treatment and gave resources for mental health follow up if concerns arise.  CSW recommends self-evaluation during the postpartum time period using the New Mom Checklist from Postpartum Progress and encouraged MOB to contact a medical professional if symptoms are noted at any time.    CSW provided review  of Sudden Infant Death Syndrome (SIDS) precautions.    CSW informed MOB about the hospital drug screen policy due to late prenatal care. MOB confirmed late prenatal care. CSW inquired about barriers to prenatal care, MOB reported that she moved from [place] and had to get her medicaid switched. MOB denied any additional barriers. CSW inquired about any substance use during pregnancy, MOB reported none. CSW informed MOB that infant's UDS was negative and CDS would continue to be monitored and a CPS report would be made if warranted. MOB verbalized understanding and reported having CPS history in [place]. MOB reported that the CPS case is closed.   CSW identifies no further need for intervention and no barriers to discharge at this time.   CSW Plan/Description:  Sudden Infant Death Syndrome (SIDS) Education, Perinatal Mood and Anxiety Disorder (PMADs) Education, Hospital Drug Screen Policy Information, CSW Will Continue to Monitor Umbilical Cord Tissue Drug Screen Results and Make Report if Warranted, No Further Intervention Required/No Barriers to Discharge, Other Information/Referral to Community Resources    Tsering Leaman L Ragina Fenter, LCSW 08/17/2021, 2:51 PM 

## 2021-08-17 NOTE — Progress Notes (Signed)
CSW acknowledged consult and attempted to meet with MOB to complete psychosocial assessment; however, MOB was in the bathroom. CSW will check back in at a later time.   Kaytlynne Neace, LCSW Clinical Social Worker [facility] Cell#: [phone] 

## 2021-08-18 DIAGNOSIS — R030 Elevated blood-pressure reading, without diagnosis of hypertension: Secondary | ICD-10-CM | POA: Diagnosis present

## 2021-08-18 MED ORDER — IBUPROFEN 600 MG PO TABS: 600.0000 mg | ORAL_TABLET | Freq: Four times a day (QID) | ORAL | 0 refills | Status: DC

## 2021-08-18 MED ORDER — ACETAMINOPHEN 325 MG PO TABS: 650.0000 mg | ORAL_TABLET | ORAL | 0 refills | Status: DC | PRN

## 2021-08-24 ENCOUNTER — Ambulatory Visit: Payer: Medicaid Other

## 2021-08-25 ENCOUNTER — Telehealth: Payer: Self-pay

## 2021-08-25 NOTE — Telephone Encounter (Signed)
Called pt regarding missed BP check visit yesterday; VM left requesting pt call back or send MyChart message to reschedule.

## 2021-08-29 ENCOUNTER — Telehealth (HOSPITAL_COMMUNITY): Payer: Self-pay | Admitting: *Deleted

## 2021-08-29 NOTE — Telephone Encounter (Signed)
Attempted hospital discharge follow-up call. Left message for patient to return RN call. Deforest Hoyles, RN, 08/29/21, [phone]

## 2021-09-05 DIAGNOSIS — Z419 Encounter for procedure for purposes other than remedying health state, unspecified: Secondary | ICD-10-CM | POA: Diagnosis not present

## 2021-09-18 ENCOUNTER — Ambulatory Visit: Payer: Medicaid Other | Admitting: Obstetrics and Gynecology

## 2021-10-02 ENCOUNTER — Other Ambulatory Visit (HOSPITAL_COMMUNITY)
Admission: RE | Admit: 2021-10-02 | Discharge: 2021-10-02 | Disposition: A | Discharge status: Home / Self Care | Payer: Medicaid Other | Source: Ambulatory Visit | Attending: Obstetrics and Gynecology | Admitting: Obstetrics and Gynecology

## 2021-10-02 ENCOUNTER — Encounter: Payer: Self-pay | Admitting: Obstetrics & Gynecology

## 2021-10-02 ENCOUNTER — Ambulatory Visit (INDEPENDENT_AMBULATORY_CARE_PROVIDER_SITE_OTHER): Payer: Medicaid Other | Admitting: Obstetrics & Gynecology

## 2021-10-02 ENCOUNTER — Other Ambulatory Visit: Payer: Self-pay

## 2021-10-02 VITALS — BP 128/78 | HR 77 | Wt 233.1 lb

## 2021-10-02 DIAGNOSIS — F418 Other specified anxiety disorders: Secondary | ICD-10-CM

## 2021-10-02 DIAGNOSIS — O9270 Unspecified disorders of lactation: Secondary | ICD-10-CM

## 2021-10-02 DIAGNOSIS — Z124 Encounter for screening for malignant neoplasm of cervix: Secondary | ICD-10-CM | POA: Insufficient documentation

## 2021-10-02 DIAGNOSIS — Z3202 Encounter for pregnancy test, result negative: Secondary | ICD-10-CM | POA: Diagnosis not present

## 2021-10-02 DIAGNOSIS — O99345 Other mental disorders complicating the puerperium: Secondary | ICD-10-CM | POA: Diagnosis not present

## 2021-10-02 LAB — POCT PREGNANCY, URINE: Preg Test, Ur: NEGATIVE

## 2021-10-02 MED ORDER — NORETHINDRONE 0.35 MG PO TABS: 1.0000 | ORAL_TABLET | Freq: Every day | ORAL | 11 refills | Status: DC

## 2021-10-02 NOTE — Progress Notes (Signed)
    Post Partum Visit Note  Erika Klein is a 25 y.o. G21P1011 female who presents for a postpartum visit. She is 6 weeks postpartum following a normal spontaneous vaginal delivery.  I have fully reviewed the prenatal and intrapartum course. The delivery was at [redacted]w[redacted]d gestational weeks.  Anesthesia: none. Postpartum course has been normal. Baby is doing well. Baby is feeding by bottle - Enfamil and Breast milk . Bleeding no bleeding. Bowel function is normal. Bladder function is normal. Patient is sexually active. Contraception method is none, would like to speak to provider about OC. Postpartum depression screening: negative.   The pregnancy intention screening data noted above was reviewed. Potential methods of contraception were discussed. The patient elected to proceed with No data recorded.  Micronor  Health Maintenance Due  Topic Date Due   HPV VACCINES (1 - 2-dose series) Never done   TETANUS/TDAP  Never done   PAP-Cervical Cytology Screening  Never done   PAP SMEAR-Modifier  Never done   INFLUENZA VACCINE  Never done    The following portions of the patient's history were reviewed and updated as appropriate: allergies, current medications, past family history, past medical history, past social history, past surgical history, and problem list.  Review of Systems Pertinent items are noted in HPI.  Objective:  LMP 11/01/2020    General:  alert, cooperative, and no distress   Breasts:  not indicated  Lungs: Effort normal  Heart:  regular rate and rhythm  Abdomen: soft, non-tender; bowel sounds normal; no masses,  no organomegaly   Wound   GU exam:  normal       Assessment:    There are no diagnoses linked to this encounter.  normal postpartum exam.   Plan:   Essential components of care per ACOG recommendations:  1.  Mood and well being: Patient with negative depression screening today. Reviewed local resources for support.  - Patient tobacco use? No.   - hx of  drug use? No.    2. Infant care and feeding:  -Patient currently breastmilk feeding?   -Social determinants of health (SDOH) reviewed in EPIC. No concerns  3. Sexuality, contraception and birth spacing - Patient does not want a pregnancy in the next year.  Desired family size is 2 children.  - Reviewed forms of contraception in tiered fashion. Patient desired oral progesterone-only contraceptive today.   - Discussed birth spacing of 18 months  4. Sleep and fatigue -Encouraged family/partner/community support of 4 hrs of uninterrupted sleep to help with mood and fatigue  5. Physical Recovery  - Discussed patients delivery and complications. She describes her labor as good. - Patient had a Vaginal, no problems at delivery. Patient had a  periurethral  laceration. Perineal healing reviewed. Patient expressed understanding - Patient has urinary incontinence? No. - Patient is safe to resume physical and sexual activity  6.  Health Maintenance - HM due items addressed Yes - Last pap smear No results found for: DIAGPAP Pap smear done at today's visit.  -Breast Cancer screening indicated? No.   7. Chronic Disease/Pregnancy Condition follow up: None   Adam Phenix, MD  [facility] for [facility], [facility]

## 2021-10-03 LAB — CERVICOVAGINAL ANCILLARY ONLY
Bacterial Vaginitis (gardnerella): POSITIVE — AB
Candida Glabrata: NEGATIVE
Candida Vaginitis: NEGATIVE
Chlamydia: NEGATIVE
Comment: NEGATIVE
Comment: NEGATIVE
Comment: NEGATIVE
Comment: NEGATIVE
Comment: NEGATIVE
Comment: NORMAL
Neisseria Gonorrhea: NEGATIVE
Trichomonas: NEGATIVE

## 2021-10-03 LAB — HEPATITIS C ANTIBODY: Hep C Virus Ab: 0.2 s/co ratio (ref 0.0–0.9)

## 2021-10-03 LAB — HEPATITIS B SURFACE ANTIGEN: Hepatitis B Surface Ag: NEGATIVE

## 2021-10-03 LAB — HIV ANTIBODY (ROUTINE TESTING W REFLEX): HIV Screen 4th Generation wRfx: NONREACTIVE

## 2021-10-04 LAB — CYTOLOGY - PAP

## 2021-10-04 NOTE — BH Specialist Note (Signed)
Pt did not arrive to video visit and did not answer the phone; Left HIPPA-compliant message to call back Ashleyann Shoun from [facility] at  [phone] (Antavius Sperbeck's office).  ?; left MyChart message for patient.  ? ?

## 2021-10-05 DIAGNOSIS — Z419 Encounter for procedure for purposes other than remedying health state, unspecified: Secondary | ICD-10-CM | POA: Diagnosis not present

## 2021-10-16 ENCOUNTER — Other Ambulatory Visit: Payer: Self-pay | Admitting: Lactation Services

## 2021-10-16 DIAGNOSIS — O0990 Supervision of high risk pregnancy, unspecified, unspecified trimester: Secondary | ICD-10-CM

## 2021-10-16 MED ORDER — PREPLUS 27-1 MG PO TABS: 1.0000 | ORAL_TABLET | Freq: Every day | ORAL | 11 refills | Status: DC

## 2021-10-16 NOTE — Progress Notes (Signed)
Mom is BF and out of PNV. Ordered per standing order

## 2021-10-18 ENCOUNTER — Ambulatory Visit: Payer: Medicaid Other | Admitting: Clinical

## 2021-10-18 DIAGNOSIS — Z91199 Patient's noncompliance with other medical treatment and regimen due to unspecified reason: Secondary | ICD-10-CM

## 2021-10-26 ENCOUNTER — Telehealth: Payer: Self-pay | Admitting: Lactation Services

## 2021-10-26 NOTE — Telephone Encounter (Signed)
Called patient to give results and recommendation for Colpo. She did not answer. LM for her to call the office to discuss results and recommendations.

## 2021-10-26 NOTE — Telephone Encounter (Signed)
-----   Message from Adam Phenix, MD sent at 10/26/2021  4:16 PM EST ----- LSIL will need colposcopy

## 2021-10-27 ENCOUNTER — Encounter: Payer: Self-pay | Admitting: Obstetrics & Gynecology

## 2021-10-27 NOTE — Telephone Encounter (Addendum)
Called pt to discuss Pap results and need for Colpo.  Voicemail message left with request for her to call back for non-urgent test results.  She may also send a Mychart message if preferred.   1035  Pt sent Mychart message stating that she was returning my call but could not get through. I called pt and left message stating that I will send a Mychart message with her test results and recommended follow up.

## 2021-11-05 DIAGNOSIS — Z419 Encounter for procedure for purposes other than remedying health state, unspecified: Secondary | ICD-10-CM | POA: Diagnosis not present

## 2021-11-05 NOTE — L&D Delivery Note (Addendum)
Delivery Note At 3:26 PM a viable female was delivered via  (Presentation:  ROA).  APGAR: 9, 9 weight pending. Pitocin started prior to placenta delivery.  Placenta status: spontaneously delivered..  Cord:  with the following complications:   Anesthesia: Epidural Episiotomy: None Lacerations: None Suture Repair:  none Est. Blood Loss (mL): 305  Mom to postpartum.  Baby to Couplet care / Skin to Skin.  Hollace Hayward, SNM 08/01/2022, 3:36 PM   Attestation of CNM Supervision of Midwife Student: Evaluation and management procedures were performed by the midwife student under my supervision. I was immediately present, gloved, and available for direct supervision, assistance and direction throughout this encounter.  I also confirm that I have verified the information documented in the resident's note, and that I have also personally reperformed the pertinent components of the physical exam and all of the medical decision making activities.  I have also made any necessary editorial changes.  Uncomplicated SVD @1526  viable female. Intact. IV Pitocin given.    Renee Harder, CNM 08/01/2022 3:47 PM

## 2021-12-04 ENCOUNTER — Other Ambulatory Visit (HOSPITAL_COMMUNITY)
Admission: RE | Admit: 2021-12-04 | Discharge: 2021-12-04 | Disposition: A | Discharge status: Home / Self Care | Payer: Medicaid Other | Source: Ambulatory Visit | Attending: Obstetrics & Gynecology | Admitting: Obstetrics & Gynecology

## 2021-12-04 ENCOUNTER — Ambulatory Visit (INDEPENDENT_AMBULATORY_CARE_PROVIDER_SITE_OTHER): Payer: Medicaid Other | Admitting: Obstetrics & Gynecology

## 2021-12-04 ENCOUNTER — Other Ambulatory Visit: Payer: Self-pay

## 2021-12-04 ENCOUNTER — Encounter: Payer: Self-pay | Admitting: Obstetrics & Gynecology

## 2021-12-04 VITALS — BP 115/76 | HR 85 | Wt 247.0 lb

## 2021-12-04 DIAGNOSIS — Z3201 Encounter for pregnancy test, result positive: Secondary | ICD-10-CM

## 2021-12-04 DIAGNOSIS — R87612 Low grade squamous intraepithelial lesion on cytologic smear of cervix (LGSIL): Secondary | ICD-10-CM

## 2021-12-04 DIAGNOSIS — N870 Mild cervical dysplasia: Secondary | ICD-10-CM | POA: Diagnosis not present

## 2021-12-04 DIAGNOSIS — Z5941 Food insecurity: Secondary | ICD-10-CM

## 2021-12-04 LAB — POCT PREGNANCY, URINE: Preg Test, Ur: POSITIVE — AB

## 2021-12-04 NOTE — Progress Notes (Signed)
UPT resulted positive.  Patient reports LMP 10-16-21

## 2021-12-04 NOTE — Addendum Note (Signed)
Addended by: Woodroe Mode on: 12/04/2021 04:31 PM   Modules accepted: Orders

## 2021-12-04 NOTE — Progress Notes (Signed)
Patient ID: Erika Klein, female   DOB: 1996-05-09, 26 y.o.   MRN: [id number]  Chief Complaint  Patient presents with   Colposcopy    HPI Erika Klein is a 26 y.o. female.  [id number] Patient's last menstrual period was 10/16/2021 (exact date). Positive UPT today and patient was informed HPI  Indications: Pap smear on November 2022 showed: low-grade squamous intraepithelial neoplasia (LGSIL - encompassing HPV,mild dysplasia,CIN I). Previous colposcopy: no. Prior cervical treatment: no treatment.  Past Medical History:  Diagnosis Date   Anxiety    Asthma    Depression     Past Surgical History:  Procedure Laterality Date   anxiety     NO PAST SURGERIES      Family History  Problem Relation Age of Onset   Schizophrenia Mother    Bipolar disorder Mother    Anxiety disorder Mother    Cancer Mother    Cancer - Cervical Mother    Anxiety disorder Father    Asthma Father     Social History Social History   Tobacco Use   Smoking status: Former    Types: Cigarettes   Smokeless tobacco: Never  Vaping Use   Vaping Use: Never used  Substance Use Topics   Alcohol use: Not Currently   Drug use: Never    No Known Allergies  Current Outpatient Medications  Medication Sig Dispense Refill   acetaminophen (TYLENOL) 325 MG tablet Take 2 tablets (650 mg total) by mouth every 4 (four) hours as needed for moderate pain or mild pain (for pain scale < 4). (Patient not taking: Reported on 12/04/2021) 90 tablet 0   ibuprofen (ADVIL) 600 MG tablet Take 1 tablet (600 mg total) by mouth every 6 (six) hours. (Patient not taking: Reported on 12/04/2021) 30 tablet 0   norethindrone (MICRONOR) 0.35 MG tablet Take 1 tablet (0.35 mg total) by mouth daily. (Patient not taking: Reported on 12/04/2021) 28 tablet 11   Prenatal Vit-Fe Fumarate-FA (PREPLUS) 27-1 MG TABS Take 1 tablet by mouth daily. (Patient not taking: Reported on 12/04/2021) 30 tablet 11   No current facility-administered  medications for this visit.    Review of Systems Review of Systems  Blood pressure 115/76, pulse 85, weight 247 lb (112 kg), last menstrual period 10/16/2021, not currently breastfeeding.  Physical Exam Physical Exam Vitals and nursing note reviewed. Exam conducted with a chaperone present.  Constitutional:      Appearance: Normal appearance.  Pulmonary:     Effort: Pulmonary effort is normal.  Genitourinary:    General: Normal vulva.  Neurological:     Mental Status: She is alert.  Psychiatric:        Mood and Affect: Mood normal.        Behavior: Behavior normal.    Data Reviewed pap  Assessment Food insecurity - Plan: AMBULATORY REFERRAL TO [facility]  LGSIL on Pap smear of cervix  Pregnancy test positive Patient given informed consent, signed copy in the chart, time out was performed.  Placed in lithotomy position. Cervix viewed with speculum and colposcope after application of acetic acid.   Colposcopy adequate?  yes Acetowhite lesions?mild Punctation?no Mosaicism?  no Abnormal vasculature?  no Biopsies? yes ECC?no due to pregnancy  COMMENTS: Patient was given post procedure instructions.  She will return in 2 weeks for results.  Emeterio Reeve, MD    Procedure Details  The risks and benefits of the procedure and Written informed consent obtained.  Speculum placed in vagina and excellent visualization of  cervix achieved, cervix swabbed x 3 with acetic acid solution.  Specimens: Bx [phone]  Complications: none.     Plan    Specimens labelled and sent to Pathology. Recommend prenatal vitamin and consider schedule PNC      Emeterio Reeve 12/04/2021, 11:33 AM

## 2021-12-06 DIAGNOSIS — Z419 Encounter for procedure for purposes other than remedying health state, unspecified: Secondary | ICD-10-CM | POA: Diagnosis not present

## 2021-12-07 LAB — SURGICAL PATHOLOGY

## 2021-12-20 IMAGING — US US MFM OB DETAIL+14 WK
1 series · 13 of 28 positions shown · non-contrast
Comparison: none

[Series 1: us mfm ob detail+14 wk · 13 of 95 slices shown]
[im 4/95]
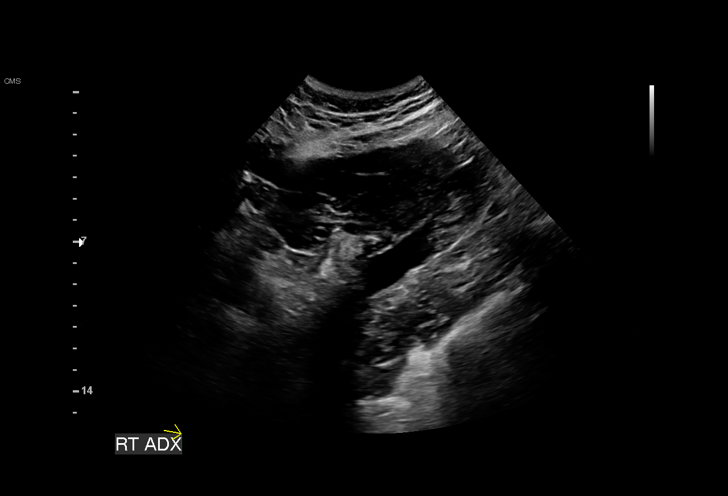
[im 11/95]
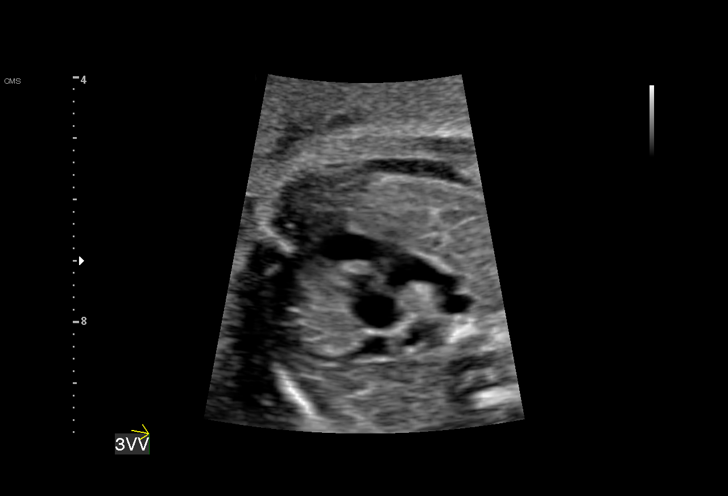
[im 18/95]
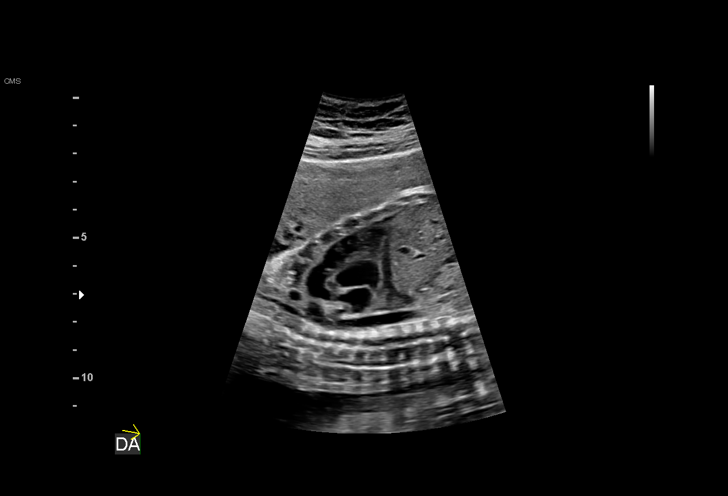
[im 25/95]
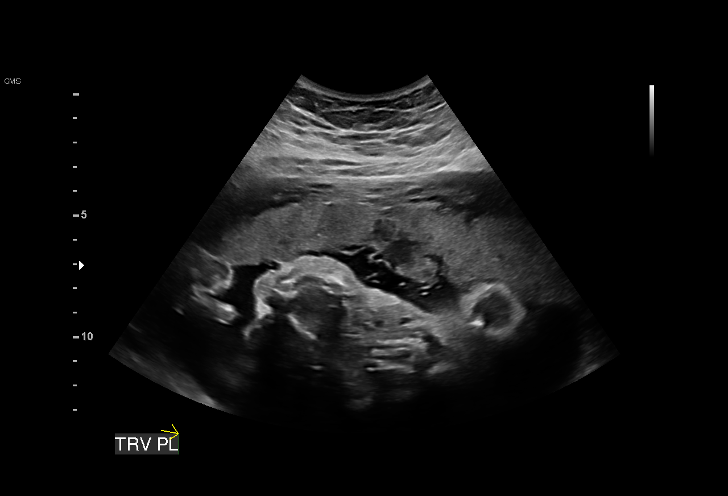
[im 32/95]
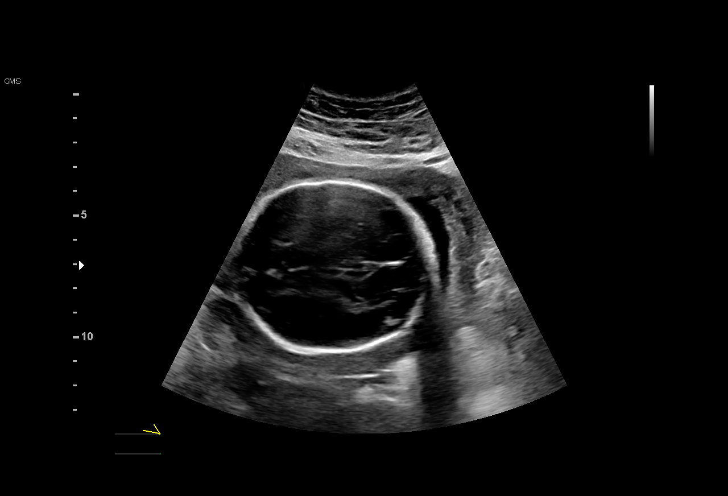
[im 39/95]
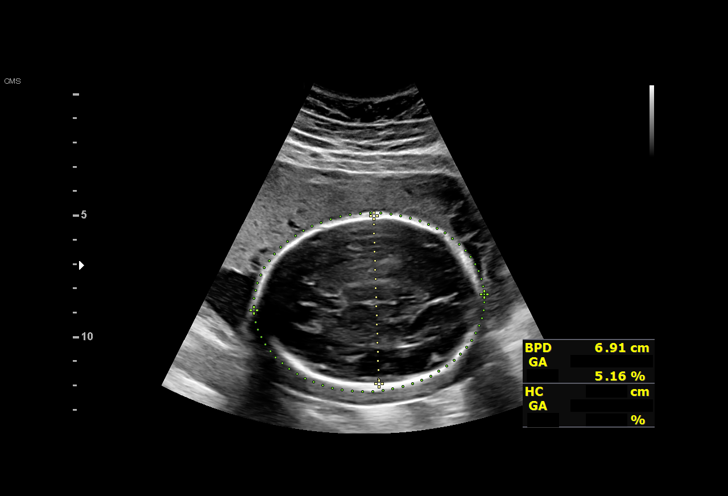
[im 49/95]
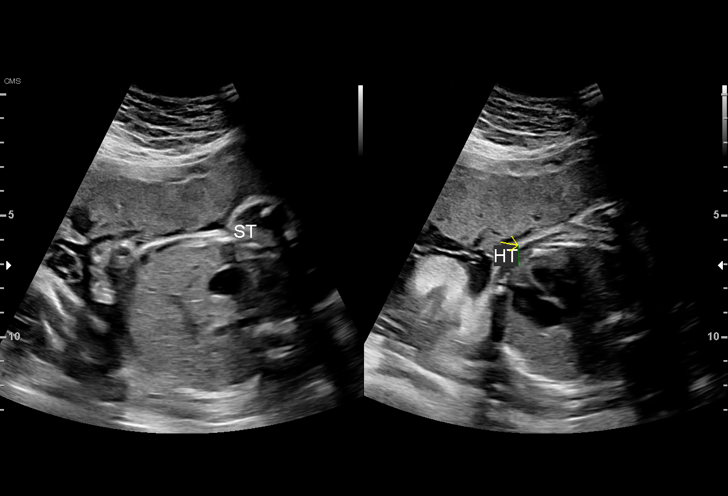
[im 56/95]
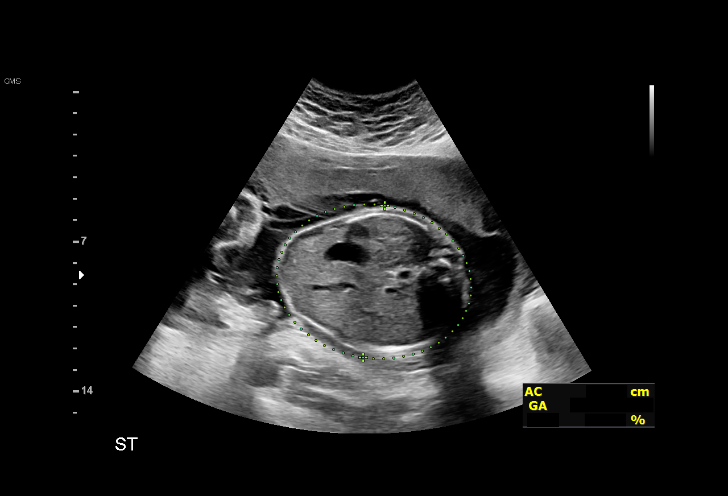
[im 63/95]
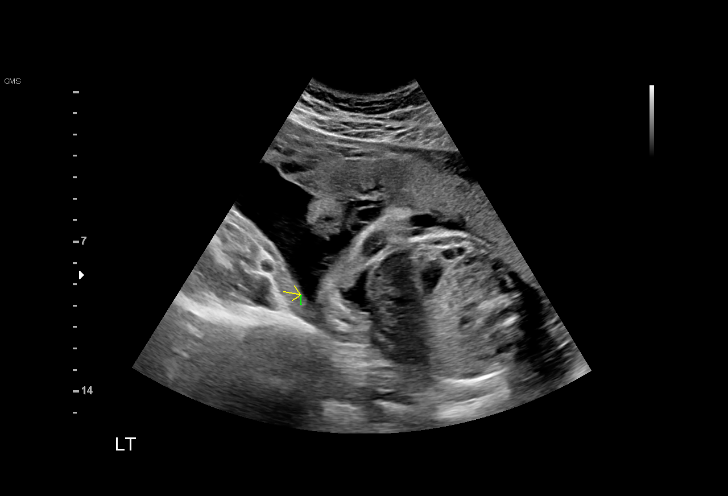
[im 70/95]
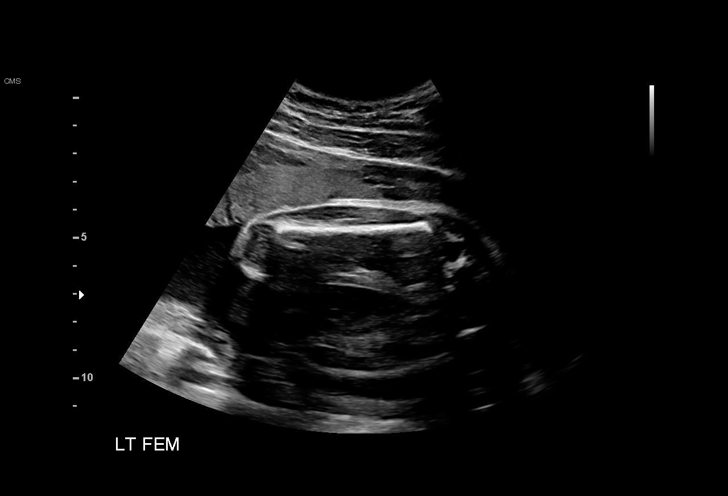
[im 77/95]
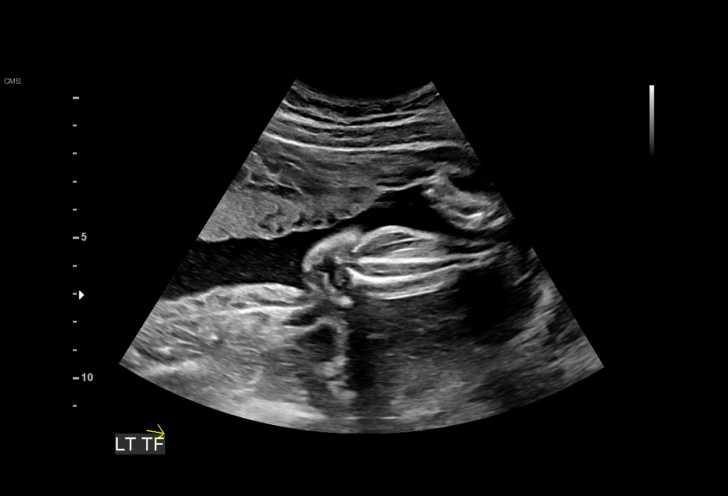
[im 84/95]
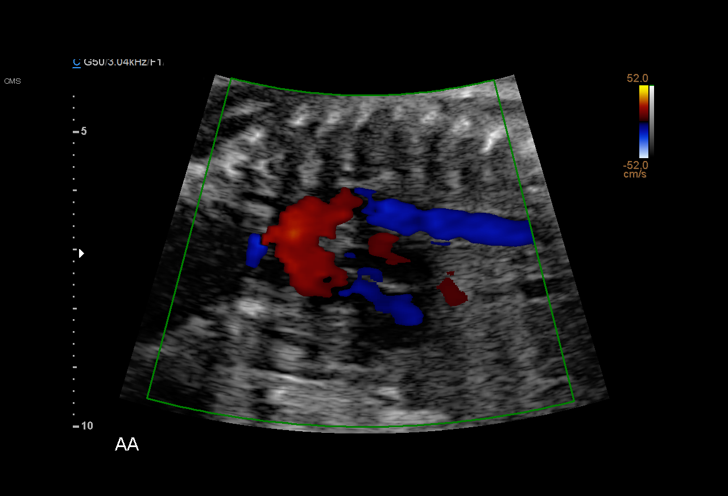
[im 91/95]
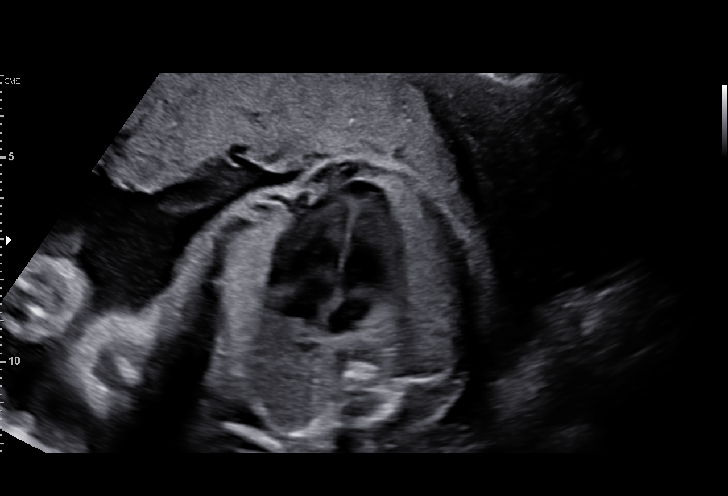

[13 of 28 positions shown; findings below may reference images not displayed]

Indications

 Obesity complicating pregnancy
 29 weeks gestation of pregnancy
 Late to prenatal care, third trimester
 Encounter for antenatal screening for
 malformations
Fetal Evaluation

 Num Of Fetuses:         1
 Preg. Location:         Intrauterine
 Fetal Heart Rate(bpm):  132
 Cardiac Activity:       Observed
 Presentation:           Breech
 Placenta:               Anterior
 P. Cord Insertion:      Visualized

 Amniotic Fluid
 AFI FV:      Within normal limits

 AFI Sum(cm)     %Tile       Largest Pocket(cm)
 19.53           76

 RUQ(cm)       RLQ(cm)       LUQ(cm)        LLQ(cm)

Biometry
 BPD:      69.2  mm     G. Age:  27w 6d          6  %    CI:        68.96   %    70 - 86
                                                         FL/HC:      21.4   %    19.6 -
 HC:      266.2  mm     G. Age:  29w 0d         12  %    HC/AC:      1.05        0.99 -
 AC:      254.6  mm     G. Age:  29w 5d         55  %    FL/BPD:     82.4   %    71 - 87
 FL:         57  mm     G. Age:  29w 6d         53  %    FL/AC:      22.4   %    20 - 24
 HUM:      51.6  mm     G. Age:  30w 1d         64  %
 CER:      33.5  mm     G. Age:  28w 3d         27  %

 LV:        2.6  mm
 CM:        9.6  mm

 Est. FW:    8948  gm      3 lb 2 oz     45  %
OB History

 Gravidity:    3         Term:   1
 TOP:          1
Gestational Age

 LMP:           30w 5d        Date:  10/22/20                 EDD:   07/29/21
 Clinical EDD:  29w 2d                                        EDD:   08/08/21
 U/S Today:     29w 1d                                        EDD:   08/09/21
 Best:          29w 2d     Det. By:  Clinical EDD             EDD:   08/08/21
Anatomy

 Cranium:               Appears normal         LVOT:                   Appears normal
 Cavum:                 Appears normal         Aortic Arch:            Appears normal
 Ventricles:            Appears normal         Ductal Arch:            Appears normal
 Choroid Plexus:        Appears normal         Diaphragm:              Appears normal
 Cerebellum:            Appears normal         Stomach:                Appears normal, left
                                                                       sided
 Posterior Fossa:       Appears normal         Abdomen:                Appears normal
 Nuchal Fold:           Not applicable (>20    Abdominal Wall:         Appears nml (cord
                        wks GA)                                        insert, abd wall)
 Face:                  Appears normal         Cord Vessels:           Appears normal (3
                                                                       vessel cord)
 Lips:                  Appears normal         Kidneys:                Appear normal
 Palate:                Not well visualized    Bladder:                Appears normal
 Thoracic:              Appears normal         Spine:                  Appears normal
 Heart:                 Appears normal         Upper Extremities:      Visualized
                        (4CH, axis, and
                        situs)
 RVOT:                  Not well visualized    Lower Extremities:      Visualized

 Other:  VC, 3VV and 3VTV visualized. Fetus appears to be female.
Cervix Uterus Adnexa

 Cervix
 Not visualized (advanced GA >68wks)

 Uterus
 No abnormality visualized.
 Right Ovary
 Not visualized.

 Left Ovary
 Not visualized.

 Cul De Sac
 No free fluid seen.

 Adnexa
 No abnormality visualized.
Comments

 This patient was seen for a detailed fetal anatomy scan due
 to maternal obesity.  She has not initiated prenatal care in her
 current pregnancy.
 She denies any significant past medical history and denies
 any problems in her current pregnancy.
 She has not had any screening tests for fetal aneuploidy
 drawn in her current pregnancy.
 She was informed that the fetal growth and amniotic fluid
 level were appropriate for her gestational age.  The fetal
 biometry measurements obtained today are consistent with
 an EDC August 08, 2021.
 There were no obvious fetal anomalies noted on today's
 ultrasound exam.  However, the views of the fetal anatomy
 were limited today due to her advanced gestational age.
 The patient was informed that anomalies may be missed due
 to technical limitations. If the fetus is in a suboptimal position
 or maternal habitus is increased, visualization of the fetus in
 the maternal uterus may be impaired.
 A follow-up exam was scheduled in 4 weeks to assess the
 fetal growth and to reassess the views of the fetal anatomy.

## 2022-01-03 DIAGNOSIS — Z419 Encounter for procedure for purposes other than remedying health state, unspecified: Secondary | ICD-10-CM | POA: Diagnosis not present

## 2022-01-30 ENCOUNTER — Ambulatory Visit (INDEPENDENT_AMBULATORY_CARE_PROVIDER_SITE_OTHER): Payer: Medicaid Other

## 2022-01-30 ENCOUNTER — Other Ambulatory Visit: Payer: Self-pay

## 2022-01-30 VITALS — BP 112/70 | HR 83 | Wt 247.8 lb

## 2022-01-30 DIAGNOSIS — Z5941 Food insecurity: Secondary | ICD-10-CM

## 2022-01-30 DIAGNOSIS — R8290 Unspecified abnormal findings in urine: Secondary | ICD-10-CM

## 2022-01-30 LAB — POCT URINALYSIS DIP (DEVICE)
Glucose, UA: NEGATIVE mg/dL
Hgb urine dipstick: NEGATIVE
Ketones, ur: NEGATIVE mg/dL
Nitrite: NEGATIVE
Protein, ur: 30 mg/dL — AB
Specific Gravity, Urine: >=1.03 (ref 1.005–1.030)
Urobilinogen, UA: 2 mg/dL — ABNORMAL HIGH (ref 0.0–1.0)
pH: 6 (ref 5.0–8.0)

## 2022-01-30 NOTE — Progress Notes (Signed)
Patient here today with concerns of UTI. Patient states she has been experiencing some abdominal discomfort and her urine has a strong odor. Urine culture collected. I explained to patient we will notify her with any abnormal results. Patient denies any other questions. Patient requested to go to food market at the end of her appointment today.  ? ?Alesia Richards, RN ?01/30/22 ?

## 2022-02-01 LAB — URINE CULTURE, OB REFLEX

## 2022-02-01 LAB — CULTURE, OB URINE

## 2022-02-07 ENCOUNTER — Telehealth (INDEPENDENT_AMBULATORY_CARE_PROVIDER_SITE_OTHER): Payer: Medicaid Other

## 2022-02-07 DIAGNOSIS — Z3480 Encounter for supervision of other normal pregnancy, unspecified trimester: Secondary | ICD-10-CM | POA: Insufficient documentation

## 2022-02-07 DIAGNOSIS — Z3A00 Weeks of gestation of pregnancy not specified: Secondary | ICD-10-CM

## 2022-02-07 NOTE — Progress Notes (Signed)
New OB Intake ? ?I connected with  Erika Klein on 02/07/22 at 10:15 AM EDT by MyChart Video Visit and verified that I am speaking with the correct person using two identifiers. Nurse is located at [facility] and pt is located at [email or web address]. ? ?I discussed the limitations, risks, security and privacy concerns of performing an evaluation and management service by telephone and the availability of in person appointments. I also discussed with the patient that there may be a patient responsible charge related to this service. The patient expressed understanding and agreed to proceed. ? ?I explained I am completing New OB Intake today. We discussed her EDD of 07/23/22 that is based on LMP of 10/16/21. Pt is G4/P2. I reviewed her allergies, medications, Medical/Surgical/OB history, and appropriate screenings. I informed her of [facility] services. Based on history, this is a/an  pregnancy uncomplicated .  ? ?Patient Active Problem List  ? Diagnosis Date Noted  ? Elevated blood pressure reading without diagnosis of hypertension 08/18/2021  ? Anxiety   ? Depression   ? ? ?Concerns addressed today ? ?Delivery Plans:  ?Plans to deliver at [facility].  ? ?MyChart/Babyscripts ?MyChart access verified. I explained pt will have some visits in office and some virtually. Babyscripts instructions given and order placed. Patient verifies receipt of registration text/e-mail. Account successfully created and app downloaded. ? ?Blood Pressure Cuff  ?Pt has her own BP Cuff Explained after first prenatal appt pt will check weekly and document in Babyscripts. ? ?Weight scale: Patient does / does not  have weight scale. Weight scale ordered for patient to pick up from [employer].  ? ?Anatomy US ?Explained first scheduled [place] will be around 19 weeks. Anatomy US scheduled for 03/01/22 at 0245. Pt notified to arrive at 0230. ?Scheduled AFP lab only appointment if CenteringPregnancy pt for same day as anatomy US.  ? ?Labs ?Discussed Avelina Laine genetic  screening with patient. Would like both Panorama and Horizon drawn at new OB visit.Also if interested in genetic testing, tell patient she will need AFP 15-21 weeks to complete genetic testing .Routine prenatal labs needed. ? ?Covid Vaccine ?Patient has not covid vaccine.  ? ?Is patient a CenteringPregnancy candidate? Accepted  "Centering Patient" indicated on sticky note ?  ?Is patient a Mom+Baby Combined Care candidate? Not a candidate   Scheduled with Mom+Baby provider  ?  ?Is patient interested in [place]? No  "Interested in [employer] - Schedule next visit with CNM" on sticky note ? ?Informed patient of [facility] website  and placed link in her AVS.  ? ?Social Determinants of Health ?Food Insecurity: Patient denies food insecurity. ?WIC Referral: Patient is interested in referral to [facility].  ?Transportation: Patient denies transportation needs. ?Childcare: Discussed no children allowed at ultrasound appointments. Offered childcare services; patient declines childcare services at this time. ? ?Send link to Pregnancy Navigators ? ? ?Placed OB Box on problem list and updated ? ?First visit review ?I reviewed new OB appt with pt. I explained she will have a pelvic exam, ob bloodwork with genetic screening, and PAP smear. Explained pt will be seen by Dr. Alysia Penna at first visit; encounter routed to appropriate provider. Explained that patient will be seen by pregnancy navigator following visit with provider. [facility] information placed in AVS.  ? ?Henrietta Dine, CMA ?02/07/2022  10:22 AM  ?

## 2022-02-07 NOTE — Patient Instructions (Signed)

## 2022-02-08 NOTE — Progress Notes (Signed)
Agree with nurses's documentation of this patient's clinic encounter.  Emmelina Mcloughlin L, MD  

## 2022-02-09 ENCOUNTER — Encounter: Payer: Self-pay | Admitting: Obstetrics & Gynecology

## 2022-02-09 DIAGNOSIS — B379 Candidiasis, unspecified: Secondary | ICD-10-CM

## 2022-02-12 MED ORDER — TERCONAZOLE 0.4 % VA CREA: 1.0000 | TOPICAL_CREAM | Freq: Every day | VAGINAL | 0 refills | Status: DC

## 2022-02-15 IMAGING — US US MFM OB FOLLOW-UP
1 series · 14 of 28 positions shown · non-contrast
Comparison: none

[Series 1: us mfm ob follow-up · 64 acquisitions, 14 frames shown]
[im 3/64]
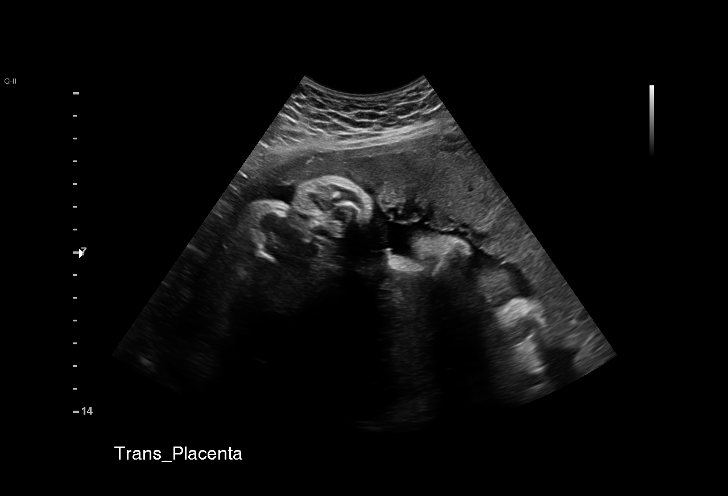
[im 8/64]
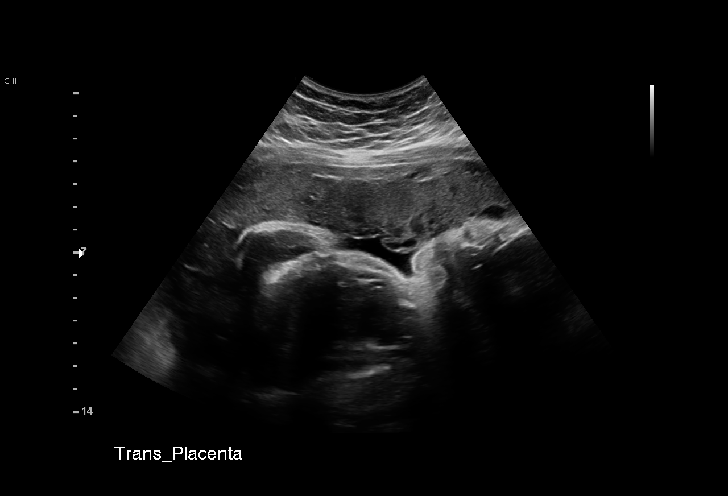
[im 12/64]
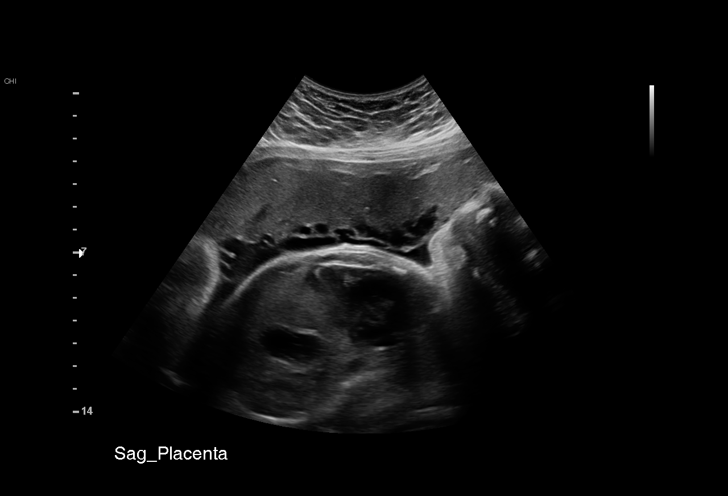
[im 17/64]
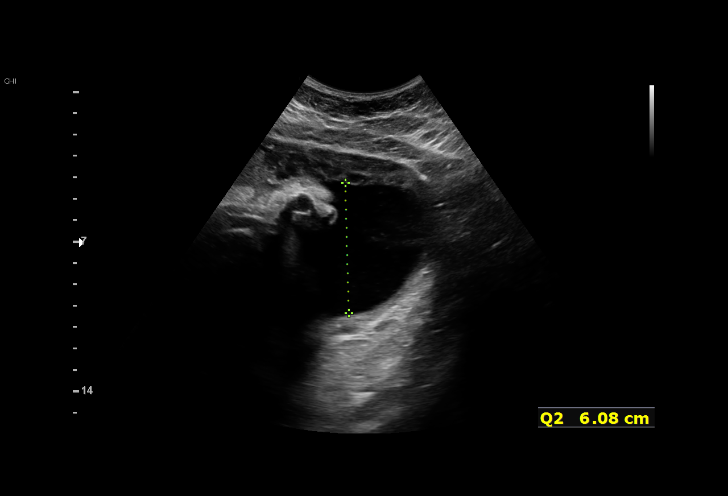
[im 22/64]
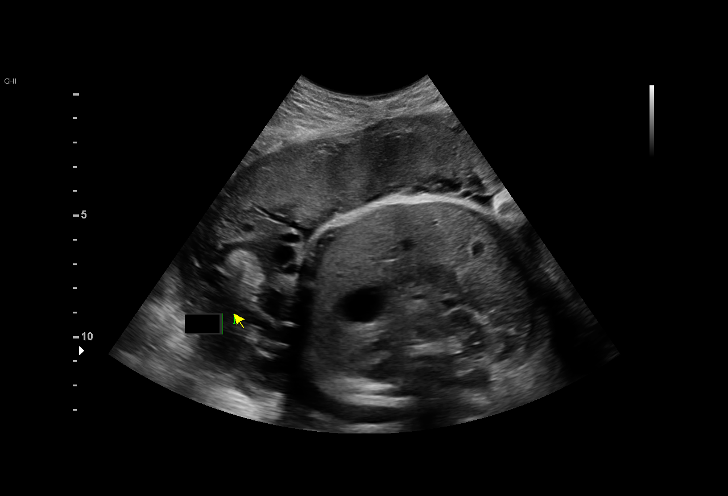
[im 26/64]
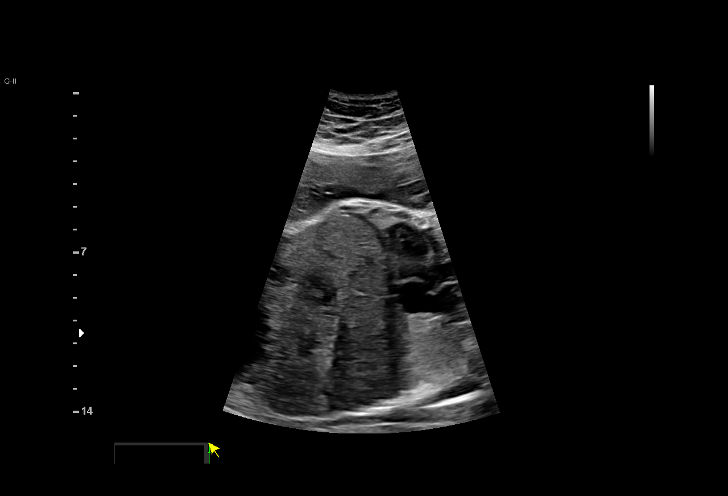
[im 31/64]
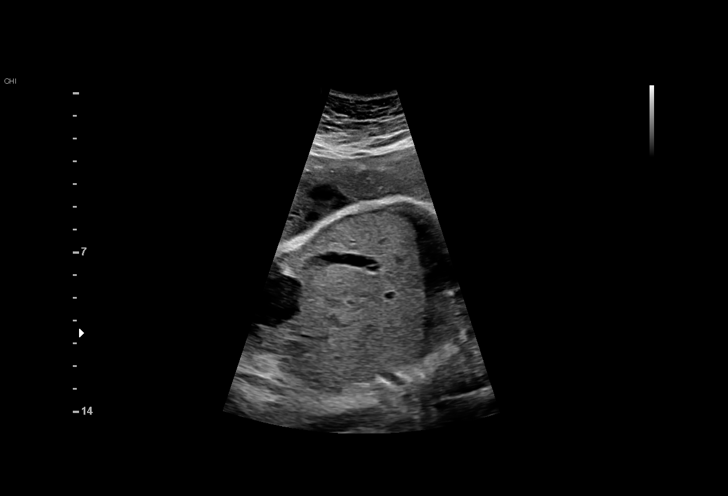
[im 36/64]
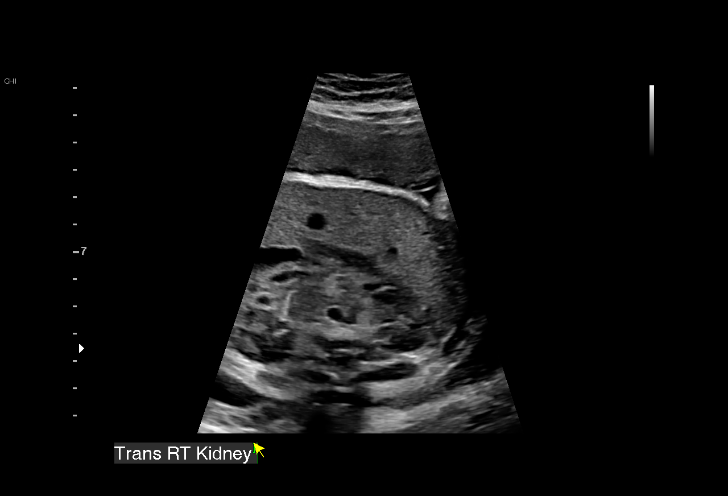
[im 40/64]
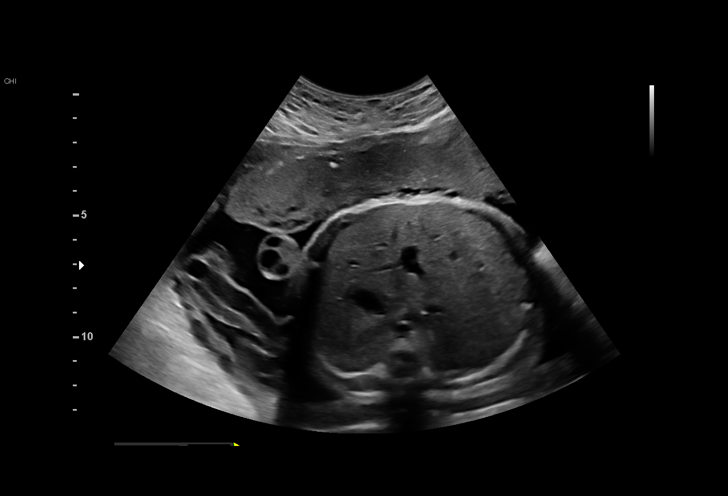
[im 45/64]
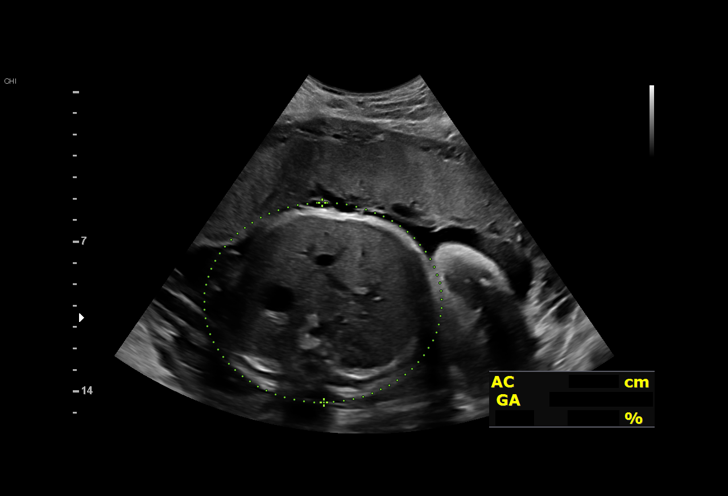
[im 50/64]
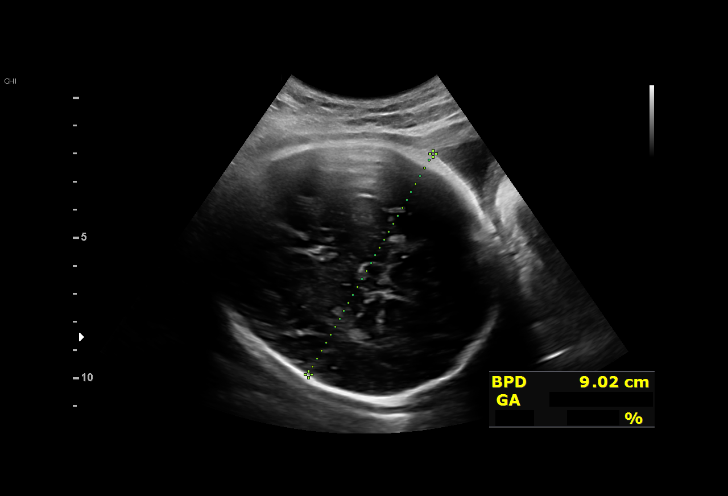
[im 54/64]
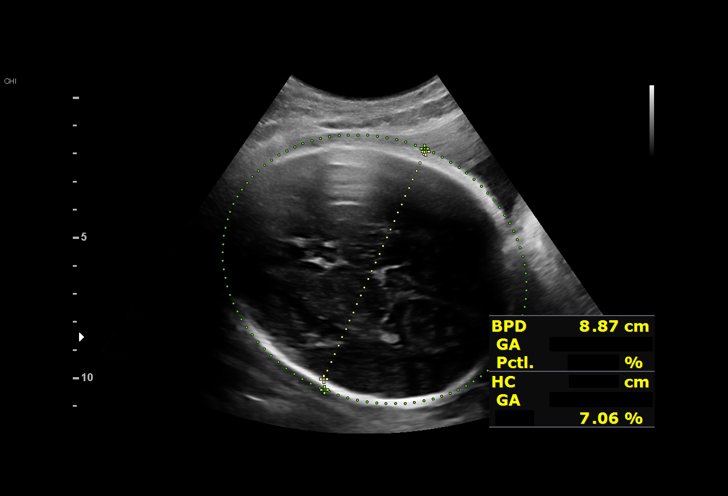
[im 59/64]
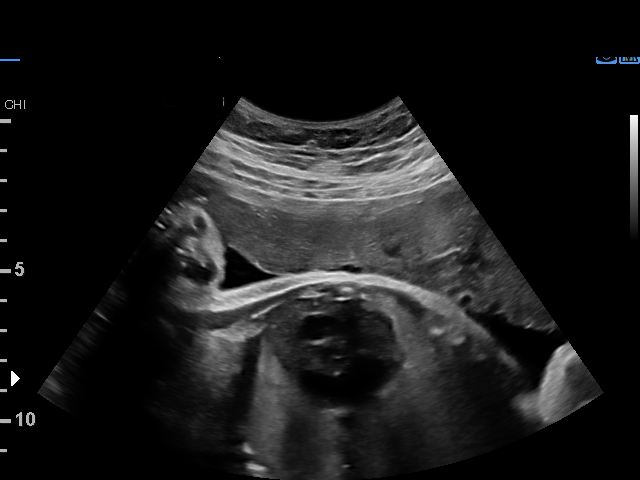
[im 64/64]
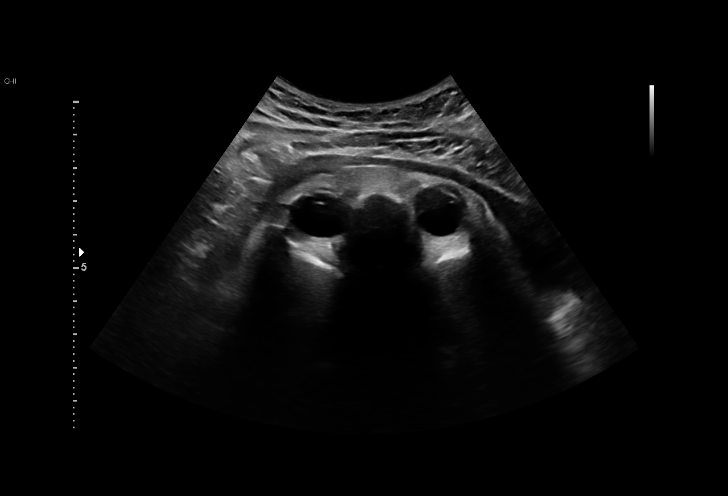

[14 of 28 positions shown; findings below may reference images not displayed]

Indications

 Late to prenatal care, third trimester
 Obesity complicating pregnancy, third
 trimester
 Poor obstetric history: Previous fetal growth
 restriction (FGR) (Induced for IUGR fetus,
 Child full term 5 lb 11 oz)
 37 weeks gestation of pregnancy
Fetal Evaluation

 Num Of Fetuses:          1
 Fetal Heart Rate(bpm):   124
 Cardiac Activity:        Observed
 Presentation:            Cephalic
 Placenta:                Anterior
 P. Cord Insertion:       Previously Visualized

 Amniotic Fluid
 AFI FV:      Within normal limits

 AFI Sum(cm)     %Tile       Largest Pocket(cm)
 19.52           76

 RUQ(cm)       RLQ(cm)       LUQ(cm)        LLQ(cm)


 Comment:    Fetal movement, tone and breathing noted.
Biometry

 BPD:      89.3  mm     G. Age:  36w 1d         32  %    CI:          77.8  %    70 - 86
                                                         FL/HC:       22.6  %    20.8 -
 HC:      320.4  mm     G. Age:  36w 1d          7  %    HC/AC:       0.99       0.92 -
 AC:      322.6  mm     G. Age:  36w 1d         30  %    FL/BPD:      81.2  %    71 - 87
 FL:       72.5  mm     G. Age:  37w 1d         42  %    FL/AC:       22.5  %    20 - 24

 Est. FW:    9568   gm     6 lb 7 oz     32  %
OB History

 Gravidity:    3         Term:   1
 TOP:          1
Gestational Age

 LMP:           38w 6d        Date:  10/22/20                 EDD:   07/29/21
 Clinical EDD:  37w 3d                                        EDD:   08/08/21
 U/S Today:     36w 3d                                        EDD:   08/15/21
 Best:          37w 3d     Det. By:  Clinical EDD             EDD:   08/08/21
Anatomy

 Cranium:               Appears normal         Aortic Arch:            Previously seen
 Cavum:                 Appears normal         Ductal Arch:            Previously seen
 Ventricles:            Appears normal         Diaphragm:              Appears normal
 Choroid Plexus:        Previously seen        Stomach:                Appears normal, left
                                                                       sided
 Cerebellum:            Previously seen        Abdomen:                Appears normal
 Posterior Fossa:       Previously seen        Abdominal Wall:         Previously seen
 Nuchal Fold:           Not applicable (>20    Cord Vessels:           Appears normal (3
                        wks GA)                                        vessel cord)
 Face:                  Orbits and profile     Kidneys:                Appear normal
                        previously seen
 Lips:                  Previously seen        Bladder:                Appears normal
 Thoracic:              Previously seen        Spine:                  Previously seen
 Heart:                 Appears normal         Upper Extremities:      Previously seen
                        (4CH, axis, and
                        situs)
 RVOT:                  Previously seen        Lower Extremities:      Previously seen
 LVOT:                  Appears normal

 Other:  VC, 3VV and 3VTV visualized previously. Previously fetus appears to
         be female.
Cervix Uterus Adnexa

 Cervix
 Not visualized (advanced GA >32wks)
Impression

 Follow up growth due to history of fetal growth restriction in a
 prior pregnancy
 Normal interval growth with measurements consistent with
 dates
 Good fetal movement and amniotic fluid volume
Recommendations

 Continue weekly testing at your providers office.

## 2022-02-16 ENCOUNTER — Encounter: Payer: Self-pay | Admitting: Obstetrics & Gynecology

## 2022-02-16 ENCOUNTER — Encounter: Payer: Self-pay | Admitting: *Deleted

## 2022-02-16 ENCOUNTER — Inpatient Hospital Stay (HOSPITAL_COMMUNITY)
Admission: AD | Admit: 2022-02-16 | Discharge: 2022-02-16 | Disposition: A | Discharge status: Home / Self Care | Payer: Medicaid Other | Attending: Obstetrics & Gynecology | Admitting: Obstetrics & Gynecology

## 2022-02-16 ENCOUNTER — Other Ambulatory Visit: Payer: Self-pay

## 2022-02-16 ENCOUNTER — Encounter (HOSPITAL_COMMUNITY): Payer: Self-pay | Admitting: Obstetrics & Gynecology

## 2022-02-16 DIAGNOSIS — R109 Unspecified abdominal pain: Secondary | ICD-10-CM | POA: Diagnosis not present

## 2022-02-16 DIAGNOSIS — O26899 Other specified pregnancy related conditions, unspecified trimester: Secondary | ICD-10-CM

## 2022-02-16 DIAGNOSIS — R102 Pelvic and perineal pain: Secondary | ICD-10-CM

## 2022-02-16 DIAGNOSIS — O26892 Other specified pregnancy related conditions, second trimester: Secondary | ICD-10-CM | POA: Insufficient documentation

## 2022-02-16 DIAGNOSIS — B3731 Acute candidiasis of vulva and vagina: Secondary | ICD-10-CM | POA: Diagnosis not present

## 2022-02-16 DIAGNOSIS — Z3A17 17 weeks gestation of pregnancy: Secondary | ICD-10-CM | POA: Diagnosis not present

## 2022-02-16 DIAGNOSIS — Z3480 Encounter for supervision of other normal pregnancy, unspecified trimester: Secondary | ICD-10-CM

## 2022-02-16 DIAGNOSIS — Z8709 Personal history of other diseases of the respiratory system: Secondary | ICD-10-CM | POA: Diagnosis not present

## 2022-02-16 DIAGNOSIS — O98812 Other maternal infectious and parasitic diseases complicating pregnancy, second trimester: Secondary | ICD-10-CM | POA: Insufficient documentation

## 2022-02-16 LAB — URINALYSIS, ROUTINE W REFLEX MICROSCOPIC
Bilirubin Urine: NEGATIVE
Glucose, UA: NEGATIVE mg/dL
Hgb urine dipstick: NEGATIVE
Ketones, ur: NEGATIVE mg/dL
Nitrite: NEGATIVE
Protein, ur: NEGATIVE mg/dL
Specific Gravity, Urine: 1.015 (ref 1.005–1.030)
pH: 6.5 (ref 5.0–8.0)

## 2022-02-16 LAB — URINALYSIS, MICROSCOPIC (REFLEX)

## 2022-02-16 MED ORDER — ALBUTEROL SULFATE HFA 108 (90 BASE) MCG/ACT IN AERS: 2.0000 | INHALATION_SPRAY | Freq: Four times a day (QID) | RESPIRATORY_TRACT | 2 refills | Status: DC | PRN

## 2022-02-16 NOTE — Discharge Instructions (Signed)
Start taking a daily probiotic, wash all underwear in hot water with white vinegar and and use the following mix on your vulva to stop discomfort: ?1/4c white vinegar + 3/4c warm water, stir and use to soak a washcloth then apply to the vulva. ?

## 2022-02-16 NOTE — MAU Note (Signed)
...  Erika Klein is a 26 y.o. at [redacted]w[redacted]d here in MAU reporting: Lower abdominal cramping that alternates between constant and intermittent for the past week. She states she has also been experiencing "pressure" that is not painful in her "butt." She states she was diagnosed with a yeast infection two days ago and is no longer experiencing vaginal itching or vaginal odors. Denies VB or LOF. Last IC two weeks ago. ? ?Pain score:  ?6/10 lower abdomen ? ?Lab orders placed from triage: UA ? ?

## 2022-02-16 NOTE — MAU Provider Note (Signed)
Event Date/Time  ? First Provider Initiated Contact with Patient 02/16/22 1128   ?  ?S ?[name] is a 26 y.o. [phone] pregnant female at [redacted]w[redacted]d who presents to MAU today with complaint of intermittent abdominal cramping and pelvic pressure (mostly in her butt) for the past couple of weeks. No cramping now, just pressure. Denies constipation, last BM last night and normal. Denies vaginal bleeding, was diagnosed with a yeast infection on 02/12/22 but did not start treatment until last night. No other physical symptoms but does request a refill of her inhaler - she has a history of asthma and it has required more of her inhaler since she started pregnancy. ? ?Pertinent items noted in HPI and remainder of comprehensive ROS otherwise negative.  ? ?O ?BP 127/63   Pulse 75   Temp 98.2 ?F (36.8 ?C) (Oral)   Resp 17   LMP 10/16/2021 (Exact Date)   SpO2 95%  ?Physical Exam ?Vitals and nursing note reviewed.  ?Constitutional:   ?   Appearance: She is well-developed.  ?HENT:  ?   Head: Normocephalic.  ?Eyes:  ?   Pupils: Pupils are equal, round, and reactive to light.  ?Cardiovascular:  ?   Rate and Rhythm: Normal rate and regular rhythm.  ?Pulmonary:  ?   Effort: Pulmonary effort is normal.  ?Abdominal:  ?   Palpations: Abdomen is soft.  ?   Tenderness: There is no abdominal tenderness.  ?Genitourinary: ?   Vagina: Normal.  ?Skin: ?   General: Skin is warm.  ?   Capillary Refill: Capillary refill takes less than 2 seconds.  ?Neurological:  ?   Mental Status: She is alert and oriented to person, place, and time.  ?Psychiatric:     ?   Mood and Affect: Mood normal.     ?   Behavior: Behavior normal.  ? ?FHR: 161 ? ?MDM/MAU Course: ?UA negative, no cramping now just pressure. Education provided on how yeast causes vaginal pressure and can cause abdominal cramping, especially at night. Reviewed reasons to come back to MAU if cramping returns and does not resolve easily or with yeast treatment. Encouraged her to also  start a probiotic and wash her underwear with vinegar to prevent recurrent yeast. ? ?A ?Yeast vaginitis ?17 weeks of gestation ?Fetal heart tones present ? ?P ?Discharge from MAU in stable condition with return precautions. ?Follow up at [facility] as scheduled for ongoing prenatal care ? ?Allergies as of 02/16/2022   ?No Known Allergies ?  ? ?  ?Medication List  ?  ? ?STOP taking these medications   ? ?ibuprofen 600 MG tablet ?Commonly known as: ADVIL ?  ?norethindrone 0.35 MG tablet ?Commonly known as: MICRONOR ?  ? ?  ? ?TAKE these medications   ? ?acetaminophen 325 MG tablet ?Commonly known as: Tylenol ?Take 2 tablets (650 mg total) by mouth every 4 (four) hours as needed for moderate pain or mild pain (for pain scale < 4). ?  ?albuterol 108 (90 Base) MCG/ACT inhaler ?Commonly known as: VENTOLIN HFA ?Inhale 2 puffs into the lungs every 6 (six) hours as needed for wheezing or shortness of breath. ?  ?PrePLUS 27-1 MG Tabs ?Take 1 tablet by mouth daily. ?  ?terconazole 0.4 % vaginal cream ?Commonly known as: TERAZOL 7 ?Place 1 applicator vaginally at bedtime. ?  ? ?  ?  ?Bernerd Limbo, CNM ?02/16/2022 12:14 PM  ? ?

## 2022-03-01 ENCOUNTER — Ambulatory Visit: Payer: Medicaid Other | Attending: Obstetrics and Gynecology

## 2022-03-01 ENCOUNTER — Ambulatory Visit: Payer: Medicaid Other | Admitting: *Deleted

## 2022-03-01 ENCOUNTER — Other Ambulatory Visit: Payer: Self-pay | Admitting: *Deleted

## 2022-03-01 VITALS — BP 115/56 | HR 79

## 2022-03-01 DIAGNOSIS — Z3A18 18 weeks gestation of pregnancy: Secondary | ICD-10-CM

## 2022-03-01 DIAGNOSIS — O09292 Supervision of pregnancy with other poor reproductive or obstetric history, second trimester: Secondary | ICD-10-CM | POA: Insufficient documentation

## 2022-03-01 DIAGNOSIS — Z3480 Encounter for supervision of other normal pregnancy, unspecified trimester: Secondary | ICD-10-CM

## 2022-03-01 DIAGNOSIS — O99212 Obesity complicating pregnancy, second trimester: Secondary | ICD-10-CM

## 2022-03-01 DIAGNOSIS — O09892 Supervision of other high risk pregnancies, second trimester: Secondary | ICD-10-CM | POA: Diagnosis not present

## 2022-03-01 DIAGNOSIS — Z363 Encounter for antenatal screening for malformations: Secondary | ICD-10-CM | POA: Insufficient documentation

## 2022-03-01 DIAGNOSIS — O0932 Supervision of pregnancy with insufficient antenatal care, second trimester: Secondary | ICD-10-CM | POA: Diagnosis not present

## 2022-03-05 DIAGNOSIS — Z419 Encounter for procedure for purposes other than remedying health state, unspecified: Secondary | ICD-10-CM | POA: Diagnosis not present

## 2022-03-06 ENCOUNTER — Ambulatory Visit (INDEPENDENT_AMBULATORY_CARE_PROVIDER_SITE_OTHER): Payer: Medicaid Other | Admitting: Obstetrics and Gynecology

## 2022-03-06 ENCOUNTER — Encounter: Payer: Self-pay | Admitting: Obstetrics and Gynecology

## 2022-03-06 ENCOUNTER — Other Ambulatory Visit (HOSPITAL_COMMUNITY)
Admission: RE | Admit: 2022-03-06 | Discharge: 2022-03-06 | Disposition: A | Discharge status: Home / Self Care | Payer: Medicaid Other | Source: Ambulatory Visit | Attending: Obstetrics and Gynecology | Admitting: Obstetrics and Gynecology

## 2022-03-06 VITALS — Wt 250.6 lb

## 2022-03-06 DIAGNOSIS — R87612 Low grade squamous intraepithelial lesion on cytologic smear of cervix (LGSIL): Secondary | ICD-10-CM

## 2022-03-06 DIAGNOSIS — Z3480 Encounter for supervision of other normal pregnancy, unspecified trimester: Secondary | ICD-10-CM | POA: Diagnosis not present

## 2022-03-06 DIAGNOSIS — O99212 Obesity complicating pregnancy, second trimester: Secondary | ICD-10-CM

## 2022-03-06 DIAGNOSIS — O99210 Obesity complicating pregnancy, unspecified trimester: Secondary | ICD-10-CM | POA: Insufficient documentation

## 2022-03-06 HISTORY — DX: Low grade squamous intraepithelial lesion on cytologic smear of cervix (LGSIL): R87.612

## 2022-03-06 NOTE — Patient Instructions (Signed)

## 2022-03-06 NOTE — Progress Notes (Signed)
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 EDD by U/S. H/O TSVD x 2 without probelms .  Denies any chronic medical problems or medications.She is currently monitored for the following issues for this low-risk pregnancy and has Anxiety; Depression; Elevated blood pressure reading without diagnosis of hypertension; Supervision of other normal pregnancy, antepartum; LGSIL on Pap smear of cervix; and Obesity affecting pregnancy on their problem list. ? ?Patient reports no complaints.  Contractions: Not present. Vag. Bleeding: None.  Movement: Present. Denies leaking of fluid.  ? ?The following portions of the patient's history were reviewed and updated as appropriate: allergies, current medications, past family history, past medical history, past social history, past surgical history and problem list. Problem list updated. ? ?Objective:  ? ?Vitals:  ? 03/06/22 1350  ?Weight: 250 lb 9.6 oz (113.7 kg)  ? ? ?Fetal Status: Fetal Heart Rate (bpm): 143   Movement: Present    ? ?General:  Alert, oriented and cooperative. Patient is in no acute distress.  ?Skin: Skin is warm and dry. No rash noted.   ?Cardiovascular: Normal heart rate noted  ?Respiratory: Normal respiratory effort, no problems with respiration noted  ?Abdomen: Soft, gravid, appropriate for gestational age. Pain/Pressure: Absent     ?Pelvic:  Cervical exam deferred        ?Extremities: Normal range of motion.  Edema: None  ?Mental Status: Normal mood and affect. Normal behavior. Normal judgment and thought content.  ? ?Urinalysis:     ? 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 ?1. Supervision of other normal pregnancy, antepartum ?Prenatal care and labs reviewed with pt ?Genetic testing discussed ? ?2. LGSIL on Pap smear of cervix ?Pap Smear PP ? ?3. Obesity affecting pregnancy in second trimester ?Serial growth scans and antenatal testing as per MFM ? ?Preterm labor symptoms and general  obstetric precautions including but not limited to vaginal bleeding, contractions, leaking of fluid and fetal movement were reviewed in detail with the patient. ?Please refer to After Visit Summary for other counseling recommendations.  ?Return in about 4 weeks (around 04/03/2022) for OB visit, face to face, any provider. ? ? ?Chancy Milroy, MD ?

## 2022-03-06 NOTE — Addendum Note (Signed)
Addended by: Hermina Staggers on: 03/06/2022 02:48 PM ? ? Modules accepted: Orders ? ?

## 2022-03-07 LAB — CERVICOVAGINAL ANCILLARY ONLY
Chlamydia: NEGATIVE
Comment: NEGATIVE
Comment: NORMAL
Neisseria Gonorrhea: NEGATIVE

## 2022-03-08 LAB — CBC/D/PLT+RPR+RH+ABO+RUBIGG...
Antibody Screen: NEGATIVE
Basophils Absolute: 0 10*3/uL (ref 0.0–0.2)
Basos: 0 %
EOS (ABSOLUTE): 0.1 10*3/uL (ref 0.0–0.4)
Eos: 2 %
HCV Ab: NONREACTIVE
HIV Screen 4th Generation wRfx: NONREACTIVE
Hematocrit: 32.7 % — ABNORMAL LOW (ref 34.0–46.6)
Hemoglobin: 11.1 g/dL (ref 11.1–15.9)
Hepatitis B Surface Ag: NEGATIVE
Immature Grans (Abs): 0 10*3/uL (ref 0.0–0.1)
Immature Granulocytes: 1 %
Lymphocytes Absolute: 1.2 10*3/uL (ref 0.7–3.1)
Lymphs: 30 %
MCH: 30.2 pg (ref 26.6–33.0)
MCHC: 33.9 g/dL (ref 31.5–35.7)
MCV: 89 fL (ref 79–97)
Monocytes Absolute: 0.5 10*3/uL (ref 0.1–0.9)
Monocytes: 12 %
Neutrophils Absolute: 2.2 10*3/uL (ref 1.4–7.0)
Neutrophils: 55 %
Platelets: 190 10*3/uL (ref 150–450)
RBC: 3.67 x10E6/uL — ABNORMAL LOW (ref 3.77–5.28)
RDW: 12 % (ref 11.7–15.4)
RPR Ser Ql: NONREACTIVE
Rh Factor: POSITIVE
Rubella Antibodies, IGG: 1.63 index (ref >0.99)
WBC: 4.1 10*3/uL (ref 3.4–10.8)

## 2022-03-08 LAB — AFP, SERUM, OPEN SPINA BIFIDA
AFP MoM: 0.89
AFP Value: 36.8 ng/mL
Gest. Age on Collection Date: 19 weeks
Maternal Age At EDD: 24.8 yr
OSBR Risk 1 IN: 10000
Test Results:: NEGATIVE
Weight: 250 [lb_av]

## 2022-03-08 LAB — HEMOGLOBIN A1C
Est. average glucose Bld gHb Est-mCnc: 77 mg/dL
Hgb A1c MFr Bld: 4.3 % — ABNORMAL LOW (ref 4.8–5.6)

## 2022-03-08 LAB — CULTURE, OB URINE

## 2022-03-08 LAB — URINE CULTURE, OB REFLEX

## 2022-03-08 LAB — HCV INTERPRETATION

## 2022-03-20 ENCOUNTER — Encounter: Payer: Medicaid Other | Admitting: Advanced Practice Midwife

## 2022-03-21 ENCOUNTER — Encounter: Payer: Self-pay | Admitting: *Deleted

## 2022-03-29 ENCOUNTER — Telehealth: Payer: Self-pay | Admitting: Lactation Services

## 2022-03-29 NOTE — Telephone Encounter (Signed)
Called patient to give [employer] Carrier Screening Results showing she is a silent carrier for [employer]al[employer]. Mailbox full and not able to leave a message at this time. My Chart message sent.

## 2022-03-30 ENCOUNTER — Encounter: Payer: Self-pay | Admitting: *Deleted

## 2022-03-30 ENCOUNTER — Ambulatory Visit: Payer: Medicaid Other | Admitting: *Deleted

## 2022-03-30 ENCOUNTER — Ambulatory Visit: Payer: Medicaid Other | Attending: Obstetrics and Gynecology

## 2022-03-30 ENCOUNTER — Other Ambulatory Visit: Payer: Self-pay | Admitting: *Deleted

## 2022-03-30 VITALS — BP 124/60 | HR 80

## 2022-03-30 DIAGNOSIS — O09892 Supervision of other high risk pregnancies, second trimester: Secondary | ICD-10-CM | POA: Insufficient documentation

## 2022-03-30 DIAGNOSIS — O09292 Supervision of pregnancy with other poor reproductive or obstetric history, second trimester: Secondary | ICD-10-CM | POA: Diagnosis not present

## 2022-03-30 DIAGNOSIS — E669 Obesity, unspecified: Secondary | ICD-10-CM | POA: Diagnosis not present

## 2022-03-30 DIAGNOSIS — Z3A22 22 weeks gestation of pregnancy: Secondary | ICD-10-CM | POA: Insufficient documentation

## 2022-03-30 DIAGNOSIS — O0932 Supervision of pregnancy with insufficient antenatal care, second trimester: Secondary | ICD-10-CM

## 2022-03-30 DIAGNOSIS — O99212 Obesity complicating pregnancy, second trimester: Secondary | ICD-10-CM

## 2022-03-30 DIAGNOSIS — Z3480 Encounter for supervision of other normal pregnancy, unspecified trimester: Secondary | ICD-10-CM | POA: Insufficient documentation

## 2022-04-05 DIAGNOSIS — Z419 Encounter for procedure for purposes other than remedying health state, unspecified: Secondary | ICD-10-CM | POA: Diagnosis not present

## 2022-04-17 ENCOUNTER — Encounter: Payer: Self-pay | Admitting: Advanced Practice Midwife

## 2022-04-26 ENCOUNTER — Telehealth: Payer: Self-pay

## 2022-04-26 ENCOUNTER — Encounter: Payer: Medicaid Other | Admitting: Family Medicine

## 2022-05-05 DIAGNOSIS — Z419 Encounter for procedure for purposes other than remedying health state, unspecified: Secondary | ICD-10-CM | POA: Diagnosis not present

## 2022-05-11 ENCOUNTER — Ambulatory Visit: Payer: Medicaid Other

## 2022-05-16 ENCOUNTER — Other Ambulatory Visit: Payer: Self-pay

## 2022-05-16 DIAGNOSIS — Z3480 Encounter for supervision of other normal pregnancy, unspecified trimester: Secondary | ICD-10-CM

## 2022-05-22 ENCOUNTER — Other Ambulatory Visit: Payer: Self-pay

## 2022-05-22 DIAGNOSIS — F419 Anxiety disorder, unspecified: Secondary | ICD-10-CM

## 2022-05-22 DIAGNOSIS — Z5900 Homelessness unspecified: Secondary | ICD-10-CM

## 2022-05-22 DIAGNOSIS — F3289 Other specified depressive episodes: Secondary | ICD-10-CM

## 2022-05-23 ENCOUNTER — Other Ambulatory Visit: Payer: Medicaid Other

## 2022-05-23 ENCOUNTER — Ambulatory Visit: Payer: Medicaid Other | Admitting: Student

## 2022-05-23 NOTE — BH Specialist Note (Signed)
Pt did not arrive to video visit and did not answer the phone; Left HIPPA-compliant message to call back Willa Brocks from [facility] at  [phone] (Lamaj Metoyer's office).  ?; left MyChart message for patient.  ? ?

## 2022-05-24 ENCOUNTER — Ambulatory Visit: Payer: Medicaid Other | Admitting: Clinical

## 2022-05-24 DIAGNOSIS — Z91199 Patient's noncompliance with other medical treatment and regimen due to unspecified reason: Secondary | ICD-10-CM

## 2022-05-25 ENCOUNTER — Telehealth: Payer: Self-pay | Admitting: Licensed Clinical Social Worker

## 2022-05-25 NOTE — Telephone Encounter (Signed)
Called pt regarding ibh referral and missed appts. Phone went straight to voicemail left message requesting callback.

## 2022-05-26 ENCOUNTER — Other Ambulatory Visit: Payer: Self-pay | Admitting: Family Medicine

## 2022-05-26 DIAGNOSIS — B379 Candidiasis, unspecified: Secondary | ICD-10-CM

## 2022-05-28 ENCOUNTER — Ambulatory Visit: Payer: Medicaid Other

## 2022-05-29 ENCOUNTER — Other Ambulatory Visit: Payer: Medicaid Other

## 2022-05-29 ENCOUNTER — Ambulatory Visit: Payer: Medicaid Other

## 2022-06-01 ENCOUNTER — Other Ambulatory Visit: Payer: Medicaid Other

## 2022-06-01 ENCOUNTER — Other Ambulatory Visit: Payer: Self-pay

## 2022-06-01 DIAGNOSIS — Z7689 Persons encountering health services in other specified circumstances: Secondary | ICD-10-CM | POA: Diagnosis not present

## 2022-06-01 DIAGNOSIS — Z3480 Encounter for supervision of other normal pregnancy, unspecified trimester: Secondary | ICD-10-CM

## 2022-06-02 LAB — HIV ANTIBODY (ROUTINE TESTING W REFLEX): HIV Screen 4th Generation wRfx: NONREACTIVE

## 2022-06-02 LAB — GLUCOSE TOLERANCE, 2 HOURS W/ 1HR
Glucose, 1 hour: 113 mg/dL (ref 70–179)
Glucose, 2 hour: 109 mg/dL (ref 70–152)
Glucose, Fasting: 70 mg/dL (ref 70–91)

## 2022-06-02 LAB — CBC
Hematocrit: 34.5 % (ref 34.0–46.6)
Hemoglobin: 11.8 g/dL (ref 11.1–15.9)
MCH: 31.1 pg (ref 26.6–33.0)
MCHC: 34.2 g/dL (ref 31.5–35.7)
MCV: 91 fL (ref 79–97)
Platelets: 168 10*3/uL (ref 150–450)
RBC: 3.79 x10E6/uL (ref 3.77–5.28)
RDW: 11.8 % (ref 11.7–15.4)
WBC: 5.1 10*3/uL (ref 3.4–10.8)

## 2022-06-02 LAB — RPR: RPR Ser Ql: NONREACTIVE

## 2022-06-03 NOTE — Progress Notes (Deleted)
   PRENATAL VISIT NOTE  Subjective:  Erika Klein is a 26 y.o. [id number] at 101w5d being seen today for ongoing prenatal care.  She is currently monitored for the following issues for this {Blank single:19197::"high-risk","low-risk"} pregnancy and has Anxiety; Depression; Elevated blood pressure reading without diagnosis of hypertension; Supervision of other normal pregnancy, antepartum; LGSIL on Pap smear of cervix; and Obesity affecting pregnancy on their problem list.  Patient reports {sx:[id number]}.   .  .   . Denies leaking of fluid.   The following portions of the patient's history were reviewed and updated as appropriate: allergies, current medications, past family history, past medical history, past social history, past surgical history and problem list.   Objective:  There were no vitals filed for this visit.  Fetal Status:           General:  Alert, oriented and cooperative. Patient is in no acute distress.  Skin: Skin is warm and dry. No rash noted.   Cardiovascular: Normal heart rate noted  Respiratory: Normal respiratory effort, no problems with respiration noted  Abdomen: Soft, gravid, appropriate for gestational age.        Pelvic: {Blank single:19197::"Cervical exam performed in the presence of a chaperone","Cervical exam deferred"}        Extremities: Normal range of motion.     Mental Status: Normal mood and affect. Normal behavior. Normal judgment and thought content.   Assessment and Plan:  Pregnancy: [id number] at [redacted]w[redacted]d 1. Supervision of other normal pregnancy, antepartum ***  2. [redacted] weeks gestation of pregnancy ***  {Blank single:19197::"Term","Preterm"} labor symptoms and general obstetric precautions including but not limited to vaginal bleeding, contractions, leaking of fluid and fetal movement were reviewed in detail with the patient. Please refer to After Visit Summary for other counseling recommendations.   No follow-ups on file.  Future Appointments  Date  Time Provider Department Center  06/04/2022  2:30 PM [facility] NURSE [facility]  06/04/2022  2:45 PM [facility] US7 [facility]  06/04/2022  3:15 PM Donna Bernard [facility]  06/29/2022 11:15 AM Brand Males, CNM [facility]    Wiatt Mahabir Autry-Lott, DO

## 2022-06-04 ENCOUNTER — Other Ambulatory Visit: Payer: Medicaid Other

## 2022-06-04 ENCOUNTER — Encounter: Payer: Self-pay | Admitting: *Deleted

## 2022-06-04 ENCOUNTER — Other Ambulatory Visit: Payer: Self-pay | Admitting: *Deleted

## 2022-06-04 ENCOUNTER — Ambulatory Visit: Payer: Medicaid Other | Admitting: *Deleted

## 2022-06-04 ENCOUNTER — Ambulatory Visit: Payer: Medicaid Other | Attending: Obstetrics and Gynecology

## 2022-06-04 ENCOUNTER — Encounter: Payer: Medicaid Other | Admitting: Family Medicine

## 2022-06-04 VITALS — BP 99/69 | HR 101

## 2022-06-04 DIAGNOSIS — Z3A31 31 weeks gestation of pregnancy: Secondary | ICD-10-CM

## 2022-06-04 DIAGNOSIS — Z6841 Body Mass Index (BMI) 40.0 and over, adult: Secondary | ICD-10-CM

## 2022-06-04 DIAGNOSIS — O99213 Obesity complicating pregnancy, third trimester: Secondary | ICD-10-CM | POA: Diagnosis present

## 2022-06-04 DIAGNOSIS — O358XX0 Maternal care for other (suspected) fetal abnormality and damage, not applicable or unspecified: Secondary | ICD-10-CM | POA: Diagnosis not present

## 2022-06-04 DIAGNOSIS — O0932 Supervision of pregnancy with insufficient antenatal care, second trimester: Secondary | ICD-10-CM | POA: Insufficient documentation

## 2022-06-04 DIAGNOSIS — Z3480 Encounter for supervision of other normal pregnancy, unspecified trimester: Secondary | ICD-10-CM | POA: Insufficient documentation

## 2022-06-04 DIAGNOSIS — O0933 Supervision of pregnancy with insufficient antenatal care, third trimester: Secondary | ICD-10-CM

## 2022-06-04 DIAGNOSIS — O09892 Supervision of other high risk pregnancies, second trimester: Secondary | ICD-10-CM | POA: Insufficient documentation

## 2022-06-04 DIAGNOSIS — O99212 Obesity complicating pregnancy, second trimester: Secondary | ICD-10-CM | POA: Insufficient documentation

## 2022-06-04 DIAGNOSIS — E669 Obesity, unspecified: Secondary | ICD-10-CM | POA: Diagnosis not present

## 2022-06-06 NOTE — Progress Notes (Unsigned)
   PRENATAL VISIT NOTE  Subjective:  Erika Klein is a 26 y.o. [id number] at [redacted]w[redacted]d being seen today for ongoing prenatal care.  She is currently monitored for the following issues for this low-risk pregnancy and has Anxiety; Depression; Elevated blood pressure reading without diagnosis of hypertension; Supervision of other normal pregnancy, antepartum; LGSIL on Pap smear of cervix; and Obesity affecting pregnancy on their problem list.  Patient reports  vaginal odor x3 days. Denies dysuria .  Contractions: Irritability. Vag. Bleeding: None.  Movement: Present. Denies leaking of fluid.   The following portions of the patient's history were reviewed and updated as appropriate: allergies, current medications, past family history, past medical history, past social history, past surgical history and problem list.   Objective:   Vitals:   06/07/22 1546  BP: 112/65  Pulse: 82  Weight: 248 lb 3.2 oz (112.6 kg)    Fetal Status: Fetal Heart Rate (bpm): 131   Movement: Present     General:  Alert, oriented and cooperative. Patient is in no acute distress.  Skin: Skin is warm and dry. No rash noted.   Cardiovascular: Normal heart rate noted  Respiratory: Normal respiratory effort, no problems with respiration noted  Abdomen: Soft, gravid, appropriate for gestational age.  Pain/Pressure: Absent     Pelvic: Cervical exam deferred        Extremities: Normal range of motion.  Edema: None  Mental Status: Normal mood and affect. Normal behavior. Normal judgment and thought content.   Assessment and Plan:  Pregnancy: [id number] at [redacted]w[redacted]d 1. Supervision of other normal pregnancy, antepartum Doing well. Routine follow up.   2. LGSIL on Pap smear of cervix Due 09/2022. Plan to repeat at [facility].   3. [redacted] weeks gestation of pregnancy Declined tdap (does not like shots). 28 week labs reviewed and wnl. Follow up in 2 weeks.   4. Screening examination for STD (sexually transmitted disease) Vaginal odor  x3 days. GC negative on 28 week labs.  - Cervicovaginal ancillary only( [facility])  5. Obesity affecting pregnancy in third trimester MFM 7/31 [place] reviewed nml growth.   6. Encounter for other general counseling or advice on contraception -POPs Does not want depo, implant, or IUD.    Preterm labor symptoms and general obstetric precautions including but not limited to vaginal bleeding, contractions, leaking of fluid and fetal movement were reviewed in detail with the patient. Please refer to After Visit Summary for other counseling recommendations.   Return in about 2 years (around 06/07/2024) for LROB follow up.  Future Appointments  Date Time Provider Department Center  06/07/2022  4:15 PM Donna Bernard [facility]  06/25/2022  9:45 AM [facility] NURSE [facility]  06/25/2022 10:00 AM [facility] US1 [facility]  06/29/2022 11:15 AM Berle Mull [facility]  07/03/2022  2:30 PM [facility] NURSE [facility]  07/03/2022  2:45 PM [facility] US5 [facility]  07/10/2022  1:30 PM [facility] NURSE [facility]  07/10/2022  1:45 PM [facility] US4 [facility]    Maikol Grassia Autry-Lott, DO

## 2022-06-07 ENCOUNTER — Other Ambulatory Visit (HOSPITAL_COMMUNITY)
Admission: RE | Admit: 2022-06-07 | Discharge: 2022-06-07 | Disposition: A | Discharge status: Home / Self Care | Payer: Medicaid Other | Source: Ambulatory Visit | Attending: Family Medicine | Admitting: Family Medicine

## 2022-06-07 ENCOUNTER — Ambulatory Visit (INDEPENDENT_AMBULATORY_CARE_PROVIDER_SITE_OTHER): Payer: Medicaid Other | Admitting: Family Medicine

## 2022-06-07 VITALS — BP 112/65 | HR 82 | Wt 248.2 lb

## 2022-06-07 DIAGNOSIS — Z3A32 32 weeks gestation of pregnancy: Secondary | ICD-10-CM

## 2022-06-07 DIAGNOSIS — Z3480 Encounter for supervision of other normal pregnancy, unspecified trimester: Secondary | ICD-10-CM

## 2022-06-07 DIAGNOSIS — Z113 Encounter for screening for infections with a predominantly sexual mode of transmission: Secondary | ICD-10-CM

## 2022-06-07 DIAGNOSIS — Z3009 Encounter for other general counseling and advice on contraception: Secondary | ICD-10-CM

## 2022-06-07 DIAGNOSIS — O99213 Obesity complicating pregnancy, third trimester: Secondary | ICD-10-CM

## 2022-06-07 DIAGNOSIS — R030 Elevated blood-pressure reading, without diagnosis of hypertension: Secondary | ICD-10-CM

## 2022-06-07 DIAGNOSIS — Z3483 Encounter for supervision of other normal pregnancy, third trimester: Secondary | ICD-10-CM

## 2022-06-07 DIAGNOSIS — R87612 Low grade squamous intraepithelial lesion on cytologic smear of cervix (LGSIL): Secondary | ICD-10-CM

## 2022-06-08 LAB — CERVICOVAGINAL ANCILLARY ONLY
Chlamydia: NEGATIVE
Comment: NEGATIVE
Comment: NEGATIVE
Comment: NORMAL
Neisseria Gonorrhea: NEGATIVE
Trichomonas: NEGATIVE

## 2022-06-13 NOTE — Progress Notes (Signed)
Patient missed appointment.

## 2022-06-25 ENCOUNTER — Ambulatory Visit: Payer: Medicaid Other | Attending: Obstetrics and Gynecology

## 2022-06-25 ENCOUNTER — Ambulatory Visit: Payer: Medicaid Other

## 2022-06-27 ENCOUNTER — Other Ambulatory Visit: Payer: Self-pay | Admitting: *Deleted

## 2022-06-29 ENCOUNTER — Telehealth (INDEPENDENT_AMBULATORY_CARE_PROVIDER_SITE_OTHER): Payer: Medicaid Other

## 2022-06-29 DIAGNOSIS — Z3483 Encounter for supervision of other normal pregnancy, third trimester: Secondary | ICD-10-CM

## 2022-06-29 DIAGNOSIS — Z3480 Encounter for supervision of other normal pregnancy, unspecified trimester: Secondary | ICD-10-CM

## 2022-06-29 DIAGNOSIS — Z3A35 35 weeks gestation of pregnancy: Secondary | ICD-10-CM

## 2022-06-29 NOTE — Progress Notes (Signed)
I connected with  Erika Klein on 06/29/22 at 11:15 AM EDT by MyChart Virtual Video Visit and verified that I am speaking with the correct person using two identifiers.   I discussed the limitations, risks, security and privacy concerns of performing an evaluation and management service by telephone and the availability of in person appointments. I also discussed with the patient that there may be a patient responsible charge related to this service. The patient expressed understanding and agreed to proceed.  Guy Begin, CMA 06/29/2022  11:14 AM

## 2022-06-29 NOTE — Progress Notes (Signed)
   OBSTETRICS PRENATAL VIRTUAL VISIT ENCOUNTER NOTE  Provider [facility]: [facility]   Patient location: Home  I connected with Erika Klein on 06/29/22 at 11:15 AM EDT by MyChart Video Encounter and verified that I am speaking with the correct person using two identifiers. I discussed the limitations, risks, security and privacy concerns of performing an evaluation and management service virtually and the availability of in person appointments. I also discussed with the patient that there may be a patient responsible charge related to this service. The patient expressed understanding and agreed to proceed. Subjective:  Erika Klein is a 26 y.o. [id number] at [redacted]w[redacted]d being seen today for ongoing prenatal care.  She is currently monitored for the following issues for this low-risk pregnancy and has Anxiety; Depression; Elevated blood pressure reading without diagnosis of hypertension; Supervision of other normal pregnancy, antepartum; LGSIL on Pap smear of cervix; and Obesity affecting pregnancy on their problem list.  Patient reports no complaints.  Contractions: Not present. Vag. Bleeding: None.  Movement: Present. Denies any leaking of fluid.   The following portions of the patient's history were reviewed and updated as appropriate: allergies, current medications, past family history, past medical history, past social history, past surgical history and problem list.   Objective:  There were no vitals filed for this visit.  Fetal Status:     Movement: Present     General:  Alert, oriented and cooperative. Patient is in no acute distress.  Respiratory: Normal respiratory effort, no problems with respiration noted  Mental Status: Normal mood and affect. Normal behavior. Normal judgment and thought content.  Rest of physical exam deferred due to type of encounter  Imaging:  Assessment and Plan:  Pregnancy: P7H4327 at [redacted]w[redacted]d 1. Supervision of other  normal pregnancy, antepartum - Routine OB. Doing well, no concerns - Cultures next visit  2. [redacted] weeks gestation of pregnancy - Endorses normal fetal movement   Preterm labor symptoms and general obstetric precautions including but not limited to vaginal bleeding, contractions, leaking of fluid and fetal movement were reviewed in detail with the patient. I discussed the assessment and treatment plan with the patient. The patient was provided an opportunity to ask questions and all were answered. The patient agreed with the plan and demonstrated an understanding of the instructions. The patient was advised to call back or seek an in-person office evaluation/go to MAU at [facility] for any urgent or concerning symptoms. Please refer to After Visit Summary for other counseling recommendations.   I provided 10 minutes of face-to-face time during this encounter.  Return in about 1 week (around 07/06/2022) for LOB.  Future Appointments  Date Time Provider Department Center  07/03/2022  2:30 PM [facility] NURSE [facility]  07/03/2022  2:45 PM [facility] US5 [facility]  07/10/2022  1:30 PM [facility] NURSE [facility]  07/10/2022  1:45 PM [facility] US4 [facility]  07/10/2022  3:35 PM Autry-Lott, Randa Evens, DO [facility]    Brand Males, CNM [facility] [employer]

## 2022-07-03 ENCOUNTER — Ambulatory Visit: Payer: Medicaid Other | Attending: Obstetrics and Gynecology

## 2022-07-03 ENCOUNTER — Ambulatory Visit: Payer: Medicaid Other

## 2022-07-07 ENCOUNTER — Encounter: Payer: Self-pay | Admitting: Obstetrics & Gynecology

## 2022-07-10 ENCOUNTER — Encounter: Payer: Self-pay | Admitting: *Deleted

## 2022-07-10 ENCOUNTER — Ambulatory Visit: Payer: Medicaid Other | Attending: Obstetrics and Gynecology

## 2022-07-10 ENCOUNTER — Other Ambulatory Visit: Payer: Self-pay | Admitting: *Deleted

## 2022-07-10 ENCOUNTER — Ambulatory Visit (INDEPENDENT_AMBULATORY_CARE_PROVIDER_SITE_OTHER): Payer: Medicaid Other | Admitting: Family Medicine

## 2022-07-10 ENCOUNTER — Ambulatory Visit: Payer: Medicaid Other | Admitting: *Deleted

## 2022-07-10 VITALS — BP 123/63 | HR 84 | Wt 253.3 lb

## 2022-07-10 VITALS — BP 123/63 | HR 84

## 2022-07-10 DIAGNOSIS — E669 Obesity, unspecified: Secondary | ICD-10-CM | POA: Diagnosis not present

## 2022-07-10 DIAGNOSIS — Z3A37 37 weeks gestation of pregnancy: Secondary | ICD-10-CM | POA: Insufficient documentation

## 2022-07-10 DIAGNOSIS — Z3480 Encounter for supervision of other normal pregnancy, unspecified trimester: Secondary | ICD-10-CM

## 2022-07-10 DIAGNOSIS — O99213 Obesity complicating pregnancy, third trimester: Secondary | ICD-10-CM

## 2022-07-10 DIAGNOSIS — O0933 Supervision of pregnancy with insufficient antenatal care, third trimester: Secondary | ICD-10-CM | POA: Insufficient documentation

## 2022-07-10 DIAGNOSIS — Z6841 Body Mass Index (BMI) 40.0 and over, adult: Secondary | ICD-10-CM | POA: Insufficient documentation

## 2022-07-10 NOTE — Progress Notes (Signed)
   PRENATAL VISIT NOTE  Subjective:  Erika Klein is a 26 y.o. [id number] at [redacted]w[redacted]d being seen today for ongoing prenatal care.  She is currently monitored for the following issues for this low-risk pregnancy and has Anxiety; Depression; Elevated blood pressure reading without diagnosis of hypertension; Supervision of other normal pregnancy, antepartum; LGSIL on Pap smear of cervix; and Obesity affecting pregnancy on their problem list.  Patient reports no complaints.  Contractions: Irregular. Vag. Bleeding: None.  Movement: Present. Denies leaking of fluid.   The following portions of the patient's history were reviewed and updated as appropriate: allergies, current medications, past family history, past medical history, past social history, past surgical history and problem list.   Objective:   Vitals:   07/10/22 1447  BP: 123/63  Pulse: 84  Weight: 114.9 kg    Fetal Status: Fetal Heart Rate (bpm): 132   Movement: Present     General:  Alert, oriented and cooperative. Patient is in no acute distress.  Skin: Skin is warm and dry. No rash noted.   Cardiovascular: Normal heart rate noted  Respiratory: Normal respiratory effort, no problems with respiration noted  Abdomen: Soft, gravid, appropriate for gestational age.  Pain/Pressure: Absent     Pelvic: Cervical exam deferred        Extremities: Normal range of motion.  Edema: None  Mental Status: Normal mood and affect. Normal behavior. Normal judgment and thought content.   Assessment and Plan:  Pregnancy: G2R4270 at [redacted]w[redacted]d 1. Supervision of other normal pregnancy, antepartum Doing well, no concerns today.   2. Obesity affecting pregnancy in third trimester Reviewed BPP today; wnl. Follow up next week.    3. [redacted] weeks gestation of pregnancy Return in 1 week. Reviewed previously negative GC/Ch - Culture, beta strep (group b only)   Term labor symptoms and general obstetric precautions including but not limited to vaginal  bleeding, contractions, leaking of fluid and fetal movement were reviewed in detail with the patient. Please refer to After Visit Summary for other counseling recommendations.   No follow-ups on file.  Future Appointments  Date Time Provider Department Center  07/10/2022  3:35 PM Lavonda Jumbo, DO [facility]  07/16/2022  2:15 PM [facility] NURSE [facility]  07/16/2022  2:30 PM [facility] US3 [facility]  07/16/2022  4:15 PM Autry-Lott, Randa Evens, DO [facility]  07/23/2022 10:55 AM Carlynn Herald, CNM [facility]  07/23/2022  1:30 PM [facility] NURSE [facility]  07/23/2022  1:45 PM [facility] US4 [facility]  07/30/2022 10:55 AM Brand Males, CNM [facility]  08/06/2022  8:15 AM [facility] NST [facility]  08/06/2022  9:15 AM Federico Flake, MD [facility]    Kaleigh Spiegelman Autry-Lott, DO

## 2022-07-10 NOTE — Patient Instructions (Addendum)
Signs and Symptoms of Labor Labor is the body's natural process of moving the baby and the placenta out of the uterus. The process of labor usually starts when the baby is full-term, between 39 and 41 weeks of pregnancy. Signs and symptoms that you are close to going into labor As your body prepares for labor and the birth of your baby, you may notice the following symptoms in the weeks and days before true labor starts: Passing a small amount of thick, bloody mucus from your vagina. This is called normal bloody show or losing your mucus plug. This may happen more than a week before labor begins, or right before labor begins, as the opening of the cervix starts to widen (dilate). For some women, the entire mucus plug passes at once. For others, pieces of the mucus plug may gradually pass over several days. Your baby moving (dropping) lower in your pelvis to get into position for birth (lightening). When this happens, you may feel more pressure on your bladder and pelvic bone and less pressure on your ribs. This may make it easier to breathe. It may also cause you to need to urinate more often and have problems with bowel movements. Having "practice contractions," also called Braxton Hicks contractions or false labor. These occur at irregular (unevenly spaced) intervals that are more than 10 minutes apart. False labor contractions are common after exercise or sexual activity. They will stop if you change position, rest, or drink fluids. These contractions are usually mild and do not get stronger over time. They may feel like: A backache or back pain. Mild cramps, similar to menstrual cramps. Tightening or pressure in your abdomen. Other early symptoms include: Nausea or loss of appetite. Diarrhea. Having a sudden burst of energy, or feeling very tired. Mood changes. Having trouble sleeping. Signs and symptoms that labor has begun Signs that you are in labor may include: Having contractions that come  at regular (evenly spaced) intervals and increase in intensity. This may feel like more intense tightening or pressure in your abdomen that moves to your back. Contractions may also feel like rhythmic pain in your upper thighs or back that comes and goes at regular intervals. If you are delivering for the first time, this change in intensity of contractions often occurs at a more gradual pace. If you have given birth before, you may notice a more rapid progression of contraction changes. Feeling pressure in the vaginal area. Your water breaking (rupture of membranes). This is when the sac of fluid that surrounds your baby breaks. Fluid leaking from your vagina may be clear or blood-tinged. Labor usually starts within 24 hours of your water breaking, but it may take longer to begin. Some people may feel a sudden gush of fluid; others may notice repeatedly damp underwear. Follow these instructions at home:  When labor starts, or if your water breaks, call your health care provider or nurse care line. Based on your situation, they will determine when you should go in for an exam. During early labor, you may be able to rest and manage symptoms at home. Some strategies to try at home include: Breathing and relaxation techniques. Taking a warm bath or shower. Listening to music. Using a heating pad on the lower back for pain. If directed, apply heat to the area as often as told by your health care provider. Use the heat source that your health care provider recommends, such as a moist heat pack or a heating pad. Place a   towel between your skin and the heat source. Leave the heat on for 20-30 minutes. Remove the heat if your skin turns bright red. This is especially important if you are unable to feel pain, heat, or cold. You have a greater risk of getting burned. Contact a health care provider if: Your labor has started. Your water breaks. You have nausea, vomiting, or diarrhea. Get help right away  if: You have painful, regular contractions that are 5 minutes apart or less. Labor starts before you are [redacted] weeks along in your pregnancy. You have a fever. You have bright red blood coming from your vagina. You do not feel your baby moving. You have a severe headache with or without vision problems. You have chest pain or shortness of breath. These symptoms may represent a serious problem that is an emergency. Do not wait to see if the symptoms will go away. Get medical help right away. Call your local emergency services (911 in the [place] Do not drive yourself to the hospital. Summary Labor is your body's natural process of moving your baby and the placenta out of your uterus. The process of labor usually starts when your baby is full-term, between 39 and 40 weeks of pregnancy. When labor starts, or if your water breaks, call your health care provider or nurse care line. Based on your situation, they will determine when you should go in for an exam. This information is not intended to replace advice given to you by your health care provider. Make sure you discuss any questions you have with your health care provider. Document Revised: 03/07/2021 Document Reviewed: 03/07/2021 Elsevier Patient Education  2023 Elsevier Inc.  

## 2022-07-14 LAB — CULTURE, BETA STREP (GROUP B ONLY): Strep Gp B Culture: NEGATIVE

## 2022-07-16 ENCOUNTER — Ambulatory Visit: Payer: Medicaid Other | Attending: Obstetrics

## 2022-07-16 ENCOUNTER — Other Ambulatory Visit: Payer: Self-pay

## 2022-07-16 ENCOUNTER — Ambulatory Visit (INDEPENDENT_AMBULATORY_CARE_PROVIDER_SITE_OTHER): Payer: Medicaid Other | Admitting: Family Medicine

## 2022-07-16 ENCOUNTER — Ambulatory Visit: Payer: Medicaid Other | Admitting: *Deleted

## 2022-07-16 VITALS — BP 118/76 | HR 82 | Wt 254.3 lb

## 2022-07-16 VITALS — BP 117/63 | HR 84

## 2022-07-16 DIAGNOSIS — Z6841 Body Mass Index (BMI) 40.0 and over, adult: Secondary | ICD-10-CM | POA: Diagnosis not present

## 2022-07-16 DIAGNOSIS — O0933 Supervision of pregnancy with insufficient antenatal care, third trimester: Secondary | ICD-10-CM | POA: Diagnosis not present

## 2022-07-16 DIAGNOSIS — O09893 Supervision of other high risk pregnancies, third trimester: Secondary | ICD-10-CM | POA: Diagnosis not present

## 2022-07-16 DIAGNOSIS — Z3483 Encounter for supervision of other normal pregnancy, third trimester: Secondary | ICD-10-CM

## 2022-07-16 DIAGNOSIS — O09293 Supervision of pregnancy with other poor reproductive or obstetric history, third trimester: Secondary | ICD-10-CM | POA: Diagnosis not present

## 2022-07-16 DIAGNOSIS — O99213 Obesity complicating pregnancy, third trimester: Secondary | ICD-10-CM | POA: Diagnosis not present

## 2022-07-16 DIAGNOSIS — Z362 Encounter for other antenatal screening follow-up: Secondary | ICD-10-CM | POA: Insufficient documentation

## 2022-07-16 DIAGNOSIS — Z3480 Encounter for supervision of other normal pregnancy, unspecified trimester: Secondary | ICD-10-CM

## 2022-07-16 DIAGNOSIS — Z3A37 37 weeks gestation of pregnancy: Secondary | ICD-10-CM

## 2022-07-16 NOTE — Patient Instructions (Signed)
Signs and Symptoms of Labor Labor is the body's natural process of moving the baby and the placenta out of the uterus. The process of labor usually starts when the baby is full-term, between 39 and 41 weeks of pregnancy. Signs and symptoms that you are close to going into labor As your body prepares for labor and the birth of your baby, you may notice the following symptoms in the weeks and days before true labor starts: Passing a small amount of thick, bloody mucus from your vagina. This is called normal bloody show or losing your mucus plug. This may happen more than a week before labor begins, or right before labor begins, as the opening of the cervix starts to widen (dilate). For some women, the entire mucus plug passes at once. For others, pieces of the mucus plug may gradually pass over several days. Your baby moving (dropping) lower in your pelvis to get into position for birth (lightening). When this happens, you may feel more pressure on your bladder and pelvic bone and less pressure on your ribs. This may make it easier to breathe. It may also cause you to need to urinate more often and have problems with bowel movements. Having "practice contractions," also called Braxton Hicks contractions or false labor. These occur at irregular (unevenly spaced) intervals that are more than 10 minutes apart. False labor contractions are common after exercise or sexual activity. They will stop if you change position, rest, or drink fluids. These contractions are usually mild and do not get stronger over time. They may feel like: A backache or back pain. Mild cramps, similar to menstrual cramps. Tightening or pressure in your abdomen. Other early symptoms include: Nausea or loss of appetite. Diarrhea. Having a sudden burst of energy, or feeling very tired. Mood changes. Having trouble sleeping. Signs and symptoms that labor has begun Signs that you are in labor may include: Having contractions that come  at regular (evenly spaced) intervals and increase in intensity. This may feel like more intense tightening or pressure in your abdomen that moves to your back. Contractions may also feel like rhythmic pain in your upper thighs or back that comes and goes at regular intervals. If you are delivering for the first time, this change in intensity of contractions often occurs at a more gradual pace. If you have given birth before, you may notice a more rapid progression of contraction changes. Feeling pressure in the vaginal area. Your water breaking (rupture of membranes). This is when the sac of fluid that surrounds your baby breaks. Fluid leaking from your vagina may be clear or blood-tinged. Labor usually starts within 24 hours of your water breaking, but it may take longer to begin. Some people may feel a sudden gush of fluid; others may notice repeatedly damp underwear. Follow these instructions at home:  When labor starts, or if your water breaks, call your health care provider or nurse care line. Based on your situation, they will determine when you should go in for an exam. During early labor, you may be able to rest and manage symptoms at home. Some strategies to try at home include: Breathing and relaxation techniques. Taking a warm bath or shower. Listening to music. Using a heating pad on the lower back for pain. If directed, apply heat to the area as often as told by your health care provider. Use the heat source that your health care provider recommends, such as a moist heat pack or a heating pad. Place a   towel between your skin and the heat source. Leave the heat on for 20-30 minutes. Remove the heat if your skin turns bright red. This is especially important if you are unable to feel pain, heat, or cold. You have a greater risk of getting burned. Contact a health care provider if: Your labor has started. Your water breaks. You have nausea, vomiting, or diarrhea. Get help right away  if: You have painful, regular contractions that are 5 minutes apart or less. Labor starts before you are [redacted] weeks along in your pregnancy. You have a fever. You have bright red blood coming from your vagina. You do not feel your baby moving. You have a severe headache with or without vision problems. You have chest pain or shortness of breath. These symptoms may represent a serious problem that is an emergency. Do not wait to see if the symptoms will go away. Get medical help right away. Call your local emergency services (911 in the [place] Do not drive yourself to the hospital. Summary Labor is your body's natural process of moving your baby and the placenta out of your uterus. The process of labor usually starts when your baby is full-term, between 39 and 40 weeks of pregnancy. When labor starts, or if your water breaks, call your health care provider or nurse care line. Based on your situation, they will determine when you should go in for an exam. This information is not intended to replace advice given to you by your health care provider. Make sure you discuss any questions you have with your health care provider. Document Revised: 03/07/2021 Document Reviewed: 03/07/2021 Elsevier Patient Education  2023 Elsevier Inc.  

## 2022-07-16 NOTE — Progress Notes (Signed)
   PRENATAL VISIT NOTE  Subjective:  Erika Klein is a 26 y.o. [id number] at [redacted]w[redacted]d being seen today for ongoing prenatal care.  She is currently monitored for the following issues for this high-risk pregnancy and has Anxiety; Depression; Elevated blood pressure reading without diagnosis of hypertension; Supervision of other normal pregnancy, antepartum; LGSIL on Pap smear of cervix; and Obesity affecting pregnancy on their problem list.  Patient reports no complaints.  Contractions: Not present. Vag. Bleeding: None.  Movement: Present. Denies leaking of fluid.   The following portions of the patient's history were reviewed and updated as appropriate: allergies, current medications, past family history, past medical history, past social history, past surgical history and problem list.   Objective:   Vitals:   07/16/22 1635  BP: 118/76  Pulse: 82  Weight: 254 lb 4.8 oz (115.3 kg)    Fetal Status: Fetal Heart Rate (bpm): 131   Movement: Present     General:  Alert, oriented and cooperative. Patient is in no acute distress.  Skin: Skin is warm and dry. No rash noted.   Cardiovascular: Normal heart rate noted  Respiratory: Normal respiratory effort, no problems with respiration noted  Abdomen: Soft, gravid, appropriate for gestational age.  Pain/Pressure: Absent     Pelvic: Cervical exam deferred        Extremities: Normal range of motion.  Edema: None  Mental Status: Normal mood and affect. Normal behavior. Normal judgment and thought content.   Assessment and Plan:  Pregnancy: [id number] at [redacted]w[redacted]d 1. Supervision of other normal pregnancy, antepartum Doing well. No concerns today.   2. Obesity affecting pregnancy in third trimester MFM scan reviewed today BPP 8/8. Induction recommended for 39-40 weeks.   3. [redacted] weeks gestation of pregnancy  Term labor symptoms and general obstetric precautions including but not limited to vaginal bleeding, contractions, leaking of fluid and fetal  movement were reviewed in detail with the patient. Please refer to After Visit Summary for other counseling recommendations.   No follow-ups on file.  Future Appointments  Date Time Provider Department Center  07/23/2022 10:55 AM Carlynn Herald, CNM [facility]  07/23/2022  1:30 PM [facility] NURSE [facility]  07/23/2022  1:45 PM [facility] US4 [facility]  07/30/2022 10:55 AM Brand Males, CNM [facility]  08/06/2022  8:15 AM [facility] NST [facility]  08/06/2022  9:15 AM Federico Flake, MD [facility]    [place] Diana Autry-Lott, DO

## 2022-07-23 ENCOUNTER — Ambulatory Visit (INDEPENDENT_AMBULATORY_CARE_PROVIDER_SITE_OTHER): Payer: Medicaid Other | Admitting: Certified Nurse Midwife

## 2022-07-23 ENCOUNTER — Ambulatory Visit: Payer: Medicaid Other | Admitting: *Deleted

## 2022-07-23 ENCOUNTER — Other Ambulatory Visit: Payer: Self-pay

## 2022-07-23 ENCOUNTER — Encounter: Payer: Self-pay | Admitting: *Deleted

## 2022-07-23 ENCOUNTER — Ambulatory Visit: Payer: Medicaid Other | Attending: Obstetrics

## 2022-07-23 VITALS — BP 120/72 | HR 88

## 2022-07-23 VITALS — BP 123/78 | HR 83 | Wt 254.1 lb

## 2022-07-23 DIAGNOSIS — O99213 Obesity complicating pregnancy, third trimester: Secondary | ICD-10-CM | POA: Diagnosis not present

## 2022-07-23 DIAGNOSIS — O09293 Supervision of pregnancy with other poor reproductive or obstetric history, third trimester: Secondary | ICD-10-CM | POA: Insufficient documentation

## 2022-07-23 DIAGNOSIS — Z3480 Encounter for supervision of other normal pregnancy, unspecified trimester: Secondary | ICD-10-CM | POA: Diagnosis present

## 2022-07-23 DIAGNOSIS — O0933 Supervision of pregnancy with insufficient antenatal care, third trimester: Secondary | ICD-10-CM | POA: Diagnosis not present

## 2022-07-23 DIAGNOSIS — Z6841 Body Mass Index (BMI) 40.0 and over, adult: Secondary | ICD-10-CM | POA: Insufficient documentation

## 2022-07-23 DIAGNOSIS — R030 Elevated blood-pressure reading, without diagnosis of hypertension: Secondary | ICD-10-CM

## 2022-07-23 DIAGNOSIS — Z3A38 38 weeks gestation of pregnancy: Secondary | ICD-10-CM

## 2022-07-23 DIAGNOSIS — O99891 Other specified diseases and conditions complicating pregnancy: Secondary | ICD-10-CM

## 2022-07-23 DIAGNOSIS — O0993 Supervision of high risk pregnancy, unspecified, third trimester: Secondary | ICD-10-CM

## 2022-07-23 DIAGNOSIS — M549 Dorsalgia, unspecified: Secondary | ICD-10-CM

## 2022-07-24 ENCOUNTER — Telehealth (HOSPITAL_COMMUNITY): Payer: Self-pay | Admitting: *Deleted

## 2022-07-24 ENCOUNTER — Encounter (HOSPITAL_COMMUNITY): Payer: Self-pay | Admitting: *Deleted

## 2022-07-24 NOTE — Progress Notes (Signed)
   PRENATAL VISIT NOTE  Subjective:  Erika Klein is a 26 y.o. [id number] at [redacted]w[redacted]d being seen today for ongoing prenatal care.  She is currently monitored for the following issues for this high-risk pregnancy and has Anxiety; Depression; Elevated blood pressure reading without diagnosis of hypertension; Supervision of other normal pregnancy, antepartum; LGSIL on Pap smear of cervix; and Obesity affecting pregnancy on their problem list.  Patient reports backache.Patient states she has had continuous back pain throughout the pregnancy. Believes it from her first epidural.  Contractions: Not present. Vag. Bleeding: None.  Movement: Present. Denies leaking of fluid.   The following portions of the patient's history were reviewed and updated as appropriate: allergies, current medications, past family history, past medical history, past social history, past surgical history and problem list.   Objective:   Vitals:   07/23/22 1117  BP: 123/78  Pulse: 83  Weight: 254 lb 1.6 oz (115.3 kg)    Fetal Status: Fetal Heart Rate (bpm): 140   Movement: Present Declined fundal height measurement today.     General:  Alert, oriented and cooperative. Patient is in no acute distress.  Skin: Skin is warm and dry. No rash noted.   Cardiovascular: Normal heart rate noted  Respiratory: Normal respiratory effort, no problems with respiration noted  Abdomen: Soft, gravid, appropriate for gestational age.  Pain/Pressure: Absent     Pelvic: Cervical exam deferred        Extremities: Normal range of motion.  Edema: None  Mental Status: Normal mood and affect. Normal behavior. Normal judgment and thought content.   Assessment and Plan:  Pregnancy: [id number] at [redacted]w[redacted]d 1. Supervision of high risk pregnancy in third trimester - Patient feeling frequent and vigorous fetal movement.   2. Elevated BP without diagnosis of hypertension - BP normotensive today in clinic.  - Monitor intrapartum.   3. Obesity affecting  pregnancy in third trimester - Per MFM IOL at 39 weeks - IOL scheduled for 07/30/22 - Patient concerns for appointment same day as IOL. Discussed that patient should proceed to whichever appointment occurs first. Discussed that hospital will notify patient of when to arrive for IOL.   4. [redacted] weeks gestation of pregnancy - Antenatal testing scheduled if patient does not deliver prior.   5. Back pain affecting pregnancy in third trimester - Maternity support belt provided in office today.  - Encouraged the use of hot showers if patient desires.   Term labor symptoms and general obstetric precautions including but not limited to vaginal bleeding, contractions, leaking of fluid and fetal movement were reviewed in detail with the patient. Please refer to After Visit Summary for other counseling recommendations.   Return in about 1 week (around 07/30/2022) for LOB.  Future Appointments  Date Time Provider [place]  07/30/2022  7:00 AM [facility] [place] [facility] None  07/30/2022 10:55 AM Renee Harder, CNM [facility]  08/06/2022  8:15 AM [facility] NST [facility]  08/06/2022  9:15 AM Caren Macadam, MD [facility]    Alioune Hodgkin Isaias Sakai) Rollene Rotunda, MSN, [place] [facility] [place]  07/24/22 1:22 PM

## 2022-07-24 NOTE — Telephone Encounter (Signed)
Preadmission screen  

## 2022-07-25 ENCOUNTER — Other Ambulatory Visit: Payer: Self-pay | Admitting: Advanced Practice Midwife

## 2022-07-25 DIAGNOSIS — O99210 Obesity complicating pregnancy, unspecified trimester: Secondary | ICD-10-CM

## 2022-07-30 ENCOUNTER — Ambulatory Visit (INDEPENDENT_AMBULATORY_CARE_PROVIDER_SITE_OTHER): Payer: Medicaid Other

## 2022-07-30 ENCOUNTER — Inpatient Hospital Stay (HOSPITAL_COMMUNITY): Payer: Medicaid Other

## 2022-07-30 VITALS — BP 136/77 | HR 76 | Wt 257.0 lb

## 2022-07-30 DIAGNOSIS — Z3483 Encounter for supervision of other normal pregnancy, third trimester: Secondary | ICD-10-CM

## 2022-07-30 DIAGNOSIS — Z3A39 39 weeks gestation of pregnancy: Secondary | ICD-10-CM

## 2022-07-30 DIAGNOSIS — Z3480 Encounter for supervision of other normal pregnancy, unspecified trimester: Secondary | ICD-10-CM

## 2022-07-30 DIAGNOSIS — O99213 Obesity complicating pregnancy, third trimester: Secondary | ICD-10-CM | POA: Diagnosis not present

## 2022-07-30 NOTE — Progress Notes (Signed)
   PRENATAL VISIT NOTE  Subjective:  Erika Klein is a 26 y.o. [id number] at [redacted]w[redacted]d being seen today for ongoing prenatal care.  She is currently monitored for the following issues for this low-risk pregnancy and has Anxiety; Depression; Elevated blood pressure reading without diagnosis of hypertension; Supervision of other normal pregnancy, antepartum; LGSIL on Pap smear of cervix; and Obesity affecting pregnancy on their problem list.  Patient reports irregular contractions. Has IOL scheduled today so unsure why she is here.  Contractions: Not present. Vag. Bleeding: None.  Movement: Present. Denies leaking of fluid.   The following portions of the patient's history were reviewed and updated as appropriate: allergies, current medications, past family history, past medical history, past social history, past surgical history and problem list.   Objective:   Vitals:   07/30/22 1033  BP: 136/77  Pulse: 76  Weight: 257 lb (116.6 kg)    Fetal Status: Fetal Heart Rate (bpm): 136   Movement: Present     General:  Alert, oriented and cooperative. Patient is in no acute distress.  Skin: Skin is warm and dry. No rash noted.   Cardiovascular: Normal heart rate noted  Respiratory: Normal respiratory effort, no problems with respiration noted  Abdomen: Soft, gravid, appropriate for gestational age.  Pain/Pressure: Absent     Pelvic: Cervical exam performed in the presence of a chaperone Dilation: Fingertip Effacement (%): Thick Station: Ballotable  Extremities: Normal range of motion.  Edema: None  Mental Status: Normal mood and affect. Normal behavior. Normal judgment and thought content.   Assessment and Plan:  Pregnancy: [id number] at [redacted]w[redacted]d 1. Supervision of other normal pregnancy, antepartum - Routine OB.  - Wait to be called in for IOL today. Discussed IOL process. - Encouraged patient to go eat lunch, walk, [employer]. - Return for PP visit in 4-6 weeks  2. [redacted] weeks gestation of  pregnancy - Endorses normal fetal movement  3. Obesity affecting pregnancy in third trimester - IOL today  Term labor symptoms and general obstetric precautions including but not limited to vaginal bleeding, contractions, leaking of fluid and fetal movement were reviewed in detail with the patient. Please refer to After Visit Summary for other counseling recommendations.   Return in about 6 weeks (around 09/10/2022) for postpartum visit.  Future Appointments  Date Time Provider [place]  08/06/2022  8:15 AM [facility] NST [facility]  08/06/2022  9:15 AM Caren Macadam, MD [facility] [place], CNM

## 2022-08-01 ENCOUNTER — Inpatient Hospital Stay (HOSPITAL_COMMUNITY): Payer: Medicaid Other | Admitting: Anesthesiology

## 2022-08-01 ENCOUNTER — Encounter (HOSPITAL_COMMUNITY): Payer: Self-pay | Admitting: Obstetrics and Gynecology

## 2022-08-01 ENCOUNTER — Inpatient Hospital Stay (HOSPITAL_COMMUNITY)
Admission: AD | Admit: 2022-08-01 | Discharge: 2022-08-02 | DRG: 807 | Disposition: A | Discharge status: Home / Self Care | Payer: Medicaid Other | Attending: Obstetrics and Gynecology | Admitting: Obstetrics and Gynecology

## 2022-08-01 ENCOUNTER — Other Ambulatory Visit: Payer: Self-pay

## 2022-08-01 DIAGNOSIS — Z3480 Encounter for supervision of other normal pregnancy, unspecified trimester: Secondary | ICD-10-CM

## 2022-08-01 DIAGNOSIS — R030 Elevated blood-pressure reading, without diagnosis of hypertension: Secondary | ICD-10-CM | POA: Diagnosis present

## 2022-08-01 DIAGNOSIS — O9952 Diseases of the respiratory system complicating childbirth: Secondary | ICD-10-CM | POA: Diagnosis present

## 2022-08-01 DIAGNOSIS — O99210 Obesity complicating pregnancy, unspecified trimester: Principal | ICD-10-CM | POA: Diagnosis present

## 2022-08-01 DIAGNOSIS — O99214 Obesity complicating childbirth: Secondary | ICD-10-CM | POA: Diagnosis present

## 2022-08-01 DIAGNOSIS — O480 Post-term pregnancy: Principal | ICD-10-CM | POA: Diagnosis present

## 2022-08-01 DIAGNOSIS — J45909 Unspecified asthma, uncomplicated: Secondary | ICD-10-CM | POA: Diagnosis present

## 2022-08-01 DIAGNOSIS — O26893 Other specified pregnancy related conditions, third trimester: Secondary | ICD-10-CM | POA: Diagnosis present

## 2022-08-01 DIAGNOSIS — Z3A40 40 weeks gestation of pregnancy: Secondary | ICD-10-CM

## 2022-08-01 LAB — CBC
HCT: 32.8 % — ABNORMAL LOW (ref 36.0–46.0)
Hemoglobin: 10.8 g/dL — ABNORMAL LOW (ref 12.0–15.0)
MCH: 31 pg (ref 26.0–34.0)
MCHC: 32.9 g/dL (ref 30.0–36.0)
MCV: 94.3 fL (ref 80.0–100.0)
Platelets: 136 10*3/uL — ABNORMAL LOW (ref 150–400)
RBC: 3.48 MIL/uL — ABNORMAL LOW (ref 3.87–5.11)
RDW: 12.7 % (ref 11.5–15.5)
WBC: 4.6 10*3/uL (ref 4.0–10.5)
nRBC: 0 % (ref 0.0–0.2)

## 2022-08-01 LAB — TYPE AND SCREEN
ABO/RH(D): O POS
Antibody Screen: NEGATIVE

## 2022-08-01 LAB — RPR: RPR Ser Ql: NONREACTIVE

## 2022-08-01 MED ORDER — TERBUTALINE SULFATE 1 MG/ML IJ SOLN: 0.2500 mg | Freq: Once | INTRAMUSCULAR | Status: DC | PRN

## 2022-08-01 MED ORDER — TETANUS-DIPHTH-ACELL PERTUSSIS 5-2.5-18.5 LF-MCG/0.5 IM SUSY: 0.5000 mL | PREFILLED_SYRINGE | Freq: Once | INTRAMUSCULAR | Status: DC

## 2022-08-01 MED ORDER — DIBUCAINE (PERIANAL) 1 % EX OINT: 1.0000 | TOPICAL_OINTMENT | CUTANEOUS | Status: DC | PRN

## 2022-08-01 MED ORDER — EPHEDRINE 5 MG/ML INJ: 10.0000 mg | INTRAVENOUS | Status: DC | PRN

## 2022-08-01 MED ORDER — FENTANYL-BUPIVACAINE-NACL 0.5-0.125-0.9 MG/250ML-% EP SOLN
12.0000 mL/h | EPIDURAL | Status: DC | PRN
  Administered 2022-08-01: 12 mL/h via EPIDURAL
  Filled 2022-08-01: qty 250

## 2022-08-01 MED ORDER — OXYTOCIN BOLUS FROM INFUSION
333.0000 mL | Freq: Once | INTRAVENOUS | Status: AC
  Administered 2022-08-01: 333 mL via INTRAVENOUS

## 2022-08-01 MED ORDER — ONDANSETRON HCL 4 MG/2ML IJ SOLN: 4.0000 mg | INTRAMUSCULAR | Status: DC | PRN

## 2022-08-01 MED ORDER — LACTATED RINGERS IV SOLN: 500.0000 mL | INTRAVENOUS | Status: DC | PRN

## 2022-08-01 MED ORDER — OXYCODONE-ACETAMINOPHEN 5-325 MG PO TABS: 1.0000 | ORAL_TABLET | ORAL | Status: DC | PRN

## 2022-08-01 MED ORDER — OXYTOCIN-SODIUM CHLORIDE 30-0.9 UT/500ML-% IV SOLN
1.0000 m[IU]/min | INTRAVENOUS | Status: DC
  Administered 2022-08-01: 2 m[IU]/min via INTRAVENOUS

## 2022-08-01 MED ORDER — ZOLPIDEM TARTRATE 5 MG PO TABS: 5.0000 mg | ORAL_TABLET | Freq: Every evening | ORAL | Status: DC | PRN

## 2022-08-01 MED ORDER — ACETAMINOPHEN 325 MG PO TABS: 650.0000 mg | ORAL_TABLET | ORAL | Status: DC | PRN

## 2022-08-01 MED ORDER — FENTANYL CITRATE (PF) 100 MCG/2ML IJ SOLN: 100.0000 ug | INTRAMUSCULAR | Status: DC | PRN

## 2022-08-01 MED ORDER — OXYTOCIN-SODIUM CHLORIDE 30-0.9 UT/500ML-% IV SOLN
2.5000 [IU]/h | INTRAVENOUS | Status: DC
  Filled 2022-08-01: qty 500

## 2022-08-01 MED ORDER — LIDOCAINE HCL (PF) 1 % IJ SOLN: 30.0000 mL | INTRAMUSCULAR | Status: DC | PRN

## 2022-08-01 MED ORDER — DIPHENHYDRAMINE HCL 50 MG/ML IJ SOLN: 12.5000 mg | INTRAMUSCULAR | Status: DC | PRN

## 2022-08-01 MED ORDER — IBUPROFEN 600 MG PO TABS
600.0000 mg | ORAL_TABLET | Freq: Four times a day (QID) | ORAL | Status: DC
  Administered 2022-08-02 (×2): 600 mg via ORAL
  Filled 2022-08-01 (×3): qty 1

## 2022-08-01 MED ORDER — PHENYLEPHRINE 80 MCG/ML (10ML) SYRINGE FOR IV PUSH (FOR BLOOD PRESSURE SUPPORT): 80.0000 ug | PREFILLED_SYRINGE | INTRAVENOUS | Status: DC | PRN

## 2022-08-01 MED ORDER — SIMETHICONE 80 MG PO CHEW: 80.0000 mg | CHEWABLE_TABLET | ORAL | Status: DC | PRN

## 2022-08-01 MED ORDER — PRENATAL MULTIVITAMIN CH
1.0000 | ORAL_TABLET | Freq: Every day | ORAL | Status: DC
  Administered 2022-08-02: 1 via ORAL
  Filled 2022-08-01: qty 1

## 2022-08-01 MED ORDER — LACTATED RINGERS IV SOLN
500.0000 mL | Freq: Once | INTRAVENOUS | Status: AC
  Administered 2022-08-01: 500 mL via INTRAVENOUS

## 2022-08-01 MED ORDER — SENNOSIDES-DOCUSATE SODIUM 8.6-50 MG PO TABS
2.0000 | ORAL_TABLET | Freq: Every day | ORAL | Status: DC
  Administered 2022-08-02 (×2): 2 via ORAL
  Filled 2022-08-01 (×2): qty 2

## 2022-08-01 MED ORDER — DIPHENHYDRAMINE HCL 25 MG PO CAPS: 25.0000 mg | ORAL_CAPSULE | Freq: Four times a day (QID) | ORAL | Status: DC | PRN

## 2022-08-01 MED ORDER — MISOPROSTOL 25 MCG QUARTER TABLET
25.0000 ug | ORAL_TABLET | Freq: Once | ORAL | Status: DC
  Filled 2022-08-01: qty 1

## 2022-08-01 MED ORDER — BENZOCAINE-MENTHOL 20-0.5 % EX AERO
1.0000 | INHALATION_SPRAY | CUTANEOUS | Status: DC | PRN
  Administered 2022-08-01: 1 via TOPICAL
  Filled 2022-08-01: qty 56

## 2022-08-01 MED ORDER — LACTATED RINGERS IV SOLN: INTRAVENOUS | Status: DC

## 2022-08-01 MED ORDER — BUPIVACAINE HCL (PF) 0.25 % IJ SOLN
INTRAMUSCULAR | Status: DC | PRN
  Administered 2022-08-01 (×2): 3 mL via EPIDURAL

## 2022-08-01 MED ORDER — ONDANSETRON HCL 4 MG PO TABS: 4.0000 mg | ORAL_TABLET | ORAL | Status: DC | PRN

## 2022-08-01 MED ORDER — COCONUT OIL OIL: 1.0000 | TOPICAL_OIL | Status: DC | PRN

## 2022-08-01 MED ORDER — LIDOCAINE-EPINEPHRINE (PF) 2 %-1:200000 IJ SOLN
INTRAMUSCULAR | Status: DC | PRN
  Administered 2022-08-01: 3 mL via EPIDURAL

## 2022-08-01 MED ORDER — OXYCODONE-ACETAMINOPHEN 5-325 MG PO TABS: 2.0000 | ORAL_TABLET | ORAL | Status: DC | PRN

## 2022-08-01 MED ORDER — SOD CITRATE-CITRIC ACID 500-334 MG/5ML PO SOLN: 30.0000 mL | ORAL | Status: DC | PRN

## 2022-08-01 MED ORDER — ONDANSETRON HCL 4 MG/2ML IJ SOLN: 4.0000 mg | Freq: Four times a day (QID) | INTRAMUSCULAR | Status: DC | PRN

## 2022-08-01 MED ORDER — WITCH HAZEL-GLYCERIN EX PADS: 1.0000 | MEDICATED_PAD | CUTANEOUS | Status: DC | PRN

## 2022-08-01 MED ORDER — MISOPROSTOL 50MCG HALF TABLET
50.0000 ug | ORAL_TABLET | Freq: Once | ORAL | Status: DC
  Filled 2022-08-01: qty 1

## 2022-08-01 NOTE — Lactation Note (Signed)
This note was copied from a baby's chart. Lactation Consultation Note  Patient Name: Erika Klein [id number] Date: 08/01/2022 Reason for consult: Initial assessment;Term Age:25 hours Assisted latching in football position. Baby latched well and BF well. Seeing good breast compressions. Mom denies painful latch. Mom stated she only BF her almost 31 yr old for 1 month then pumped and bottle fed for 5 months along w/formula. Mom stated she had to much milk and had to pump after BF because her breast were full. Mom stated baby had tongue tie so maybe baby didn't transfer the milk properly. ? Mom denies painful latch w/this baby. Baby is BF well at this time. Reviewed STS, I&O, body alignment, positioning, support, newborn feeding habits and behavior. Encouraged to call for assistance as needed.  Maternal Data Has patient been taught Hand Expression?: Yes Does the patient have breastfeeding experience prior to this delivery?: Yes How long did the patient breastfeed?: 5 months to her 25 yr old/mainly pumped  Feeding Nipple Type: Slow - flow  LATCH Score Latch: Grasps breast easily, tongue down, lips flanged, rhythmical sucking.  Audible Swallowing: A few with stimulation  Type of Nipple: Everted at rest and after stimulation  Comfort (Breast/Nipple): Soft / non-tender  Hold (Positioning): Assistance needed to correctly position infant at breast and maintain latch.  LATCH Score: 8   Lactation Tools Discussed/Used    Interventions Interventions: Breast feeding basics reviewed;Adjust position;Assisted with latch;Support pillows;Skin to skin;Position options;Breast massage;Hand express;LC Services brochure;Breast compression  Discharge    Consult Status Consult Status: Follow-up Date: 08/02/22 Follow-up type: In-patient    Theodoro Kalata 08/01/2022, 10:50 PM

## 2022-08-01 NOTE — Progress Notes (Addendum)
Erika Klein is a 26 y.o. [id number] at [redacted]w[redacted]d by  admitted for induction of labor due to BMI.  Subjective:   Comfortable with epidural.    Objective: BP (!) 127/40   Pulse (!) 57   Temp 98.3 F (36.8 C) (Oral)   Resp 16   LMP 10/16/2021 (Exact Date)   SpO2 99%  No intake/output data recorded. No intake/output data recorded.  FHT:  FHR: 125 bpm, variability: moderate,  accelerations:  Present,  decelerations:  Absent UC:   regular, every 2-3 minutes SVE:   Dilation: 4 Effacement (%): 80 Station: -2 Exam by:: Ronalee Belts RN and Adonis Brook RN  Labs: Lab Results  Component Value Date   WBC 4.6 08/01/2022   HGB 10.8 (L) 08/01/2022   HCT 32.8 (L) 08/01/2022   MCV 94.3 08/01/2022   PLT 136 (L) 08/01/2022    Assessment / Plan: Induction of labor due to BMI,  progressing well on pitocin  Labor: Progressing normally Fetal Wellbeing:  Category I Pain Control:  Epidural I/D:   GBS - Anticipated MOD:  NSVD  Hollace Hayward, Student-MidWife 08/01/2022, 10:05 AM   Attestation of CNM Supervision of Midwife Student: Evaluation and management procedures were performed by the midwife student under my supervision. I was immediately available for direct supervision, assistance and direction throughout this encounter.  I also confirm that I have verified the information documented in the resident's note, and that I have also personally reperformed the pertinent components of the physical exam and all of the medical decision making activities.  I have also made any necessary editorial changes.  Progressing. Continue Pitocin. AROM at next exam if appropriate  Renee Harder, CNM 08/01/2022 10:12 AM

## 2022-08-01 NOTE — H&P (Signed)
OBSTETRIC ADMISSION HISTORY AND PHYSICAL  Erika Klein is a 26 y.o. female [phone] with IUP at [redacted]w[redacted]d by [place] presenting for eIOL. She reports +FMs, No LOF, no VB, no blurry vision, headaches or peripheral edema, and RUQ pain.  She plans on breast feeding. She request POPs for birth control. She received her prenatal care at [facility]   Dating: By  [place] --->  Estimated Date of Delivery: 07/31/22  Sono:    @[redacted]w[redacted]d , CWD, normal anatomy, cephalic presentation, 3028g, EFW   Prenatal History/Complications:   Patient Active Problem List   Diagnosis Date Noted   Maternal obesity affecting pregnancy, antepartum 08/01/2022   LGSIL on Pap smear of cervix 03/06/2022   Obesity affecting pregnancy 03/06/2022   Supervision of other normal pregnancy, antepartum 02/07/2022   Elevated blood pressure reading without diagnosis of hypertension 08/18/2021   Anxiety    Depression     Nursing Staff Provider  Office Location  [facility] Dating    [facility] Model [ ]  Traditional [x ] Centering [ ]  Mom-Baby Dyad    Language   Anatomy [facility]   normal  Flu Vaccine  Declined-02/07/22 Genetic/Carrier Screen  NIPS:   LR AFP:   negative Horizon:  TDaP Vaccine   Declined 06/07/22 Hgb A1C or  GTT Early 4.3 Third trimester   COVID Vaccine No   LAB RESULTS   Rhogam  NA Blood Type --/--/O POS (10/12 1212)   Baby Feeding Plan Breast Antibody NEG (10/12 1212)  Contraception POPs Rubella 2.34 (08/08 1604)  Circumcision Yes RPR NON REACTIVE (10/12 1215)   Pediatrician  [facility] HBsAg Negative (11/28 1628)   Support Person Jamere(FOB) HCVAb negative  Prenatal Classes  HIV Non Reactive (11/28 1628)     BTL Consent NA GBS  Negative  VBAC Consent NA Pap  11/22 LGSIL       DME Rx 03-29-2001 ] BP cuff-Has own Cuff [ ]  Weight Scale Waterbirth  [ ]  Class [ ]  Consent [ ]  CNM visit  PHQ9 & GAD7 [  ] new OB [  ] 28 weeks  [  ] 36 weeks Induction  [ ]  Orders Entered [ ] Foley Y/N    Past Medical History: Past Medical History:  Diagnosis  Date   Anxiety    Asthma    Depression     Past Surgical History: Past Surgical History:  Procedure Laterality Date   anxiety     NO PAST SURGERIES      Obstetrical History: OB History     Gravida  4   Para  2   Term  2   Preterm      AB  1   Living  2      SAB      IAB  1   Ectopic      Multiple      Live Births  2           Social History Social History   Socioeconomic History   Marital status: Single    Spouse name: Not on file   Number of children: Not on file   Years of education: Not on file   Highest education level: Not on file  Occupational History   Not on file  Tobacco Use   Smoking status: Never   Smokeless tobacco: Never  Vaping Use   Vaping Use: Never used  Substance and Sexual Activity   Alcohol use: Not Currently   Drug use: Never   Sexual activity: Yes  Birth control/protection: None  Other Topics Concern   Not on file  Social History Narrative   Not on file   Social Determinants of Health   Financial Resource Strain: Not on file  Food Insecurity: Food Insecurity Present (07/23/2022)   Hunger Vital Sign    Worried About Running Out of Food in the Last Year: Often true    Ran Out of Food in the Last Year: Often true  Transportation Needs: No Transportation Needs (07/23/2022)   PRAPARE - Administrator, Civil Service (Medical): No    Lack of Transportation (Non-Medical): No  Physical Activity: Not on file  Stress: Not on file  Social Connections: Not on file    Family History: Family History  Problem Relation Age of Onset   Schizophrenia Mother    Bipolar disorder Mother    Anxiety disorder Mother    Cancer Mother    Cancer - Cervical Mother    Anxiety disorder Father    Asthma Father    Hypertension Maternal Aunt    Hypertension Maternal Grandmother    Stroke Paternal Grandmother    Hypertension Paternal Grandmother    Diabetes Neg Hx    Heart disease Neg Hx     Allergies: No Known  Allergies  Medications Prior to Admission  Medication Sig Dispense Refill Last Dose   albuterol (VENTOLIN HFA) 108 (90 Base) MCG/ACT inhaler Inhale 2 puffs into the lungs every 6 (six) hours as needed for wheezing or shortness of breath. 8 g 2    Prenatal Vit-Fe Fumarate-FA (PREPLUS) 27-1 MG TABS Take 1 tablet by mouth daily. 30 tablet 11      Review of Systems   All systems reviewed and negative except as stated in HPI  Last menstrual period 10/16/2021, not currently breastfeeding. General appearance: alert, cooperative, and appears stated age Lungs: clear to auscultation bilaterally Heart: regular rate and rhythm Abdomen: soft, non-tender; bowel sounds normal Extremities: Homans sign is negative, no sign of DVT Presentation: cephalic Fetal monitoringBaseline: 125  bpm, Variability: Good {> 6 bpm), Accelerations: Reactive, and Decelerations: Absent Uterine activity Frequency: occasional Dilation: 2.5 Effacement (%): 50 Station: -2 Exam by:: Wendelyn Breslow RN   Prenatal labs: ABO, Rh: --/--/PENDING (09/27 0550) Antibody: PENDING (09/27 0550) Rubella: 1.63 (05/02 1443) RPR: Non Reactive (07/28 0912)  HBsAg: Negative (05/02 1443)  HIV: Non Reactive (07/28 0912)  GBS: Negative/-- (09/05 1621)  1 hr Glucola nml Genetic screening  nml Anatomy US nml  Prenatal Transfer Tool  Maternal Diabetes: No Genetic Screening: Normal Maternal Ultrasounds/Referrals: Normal Fetal Ultrasounds or other Referrals:  None Maternal Substance Abuse:  No Significant Maternal Medications:  None Significant Maternal Lab Results:  Group B Strep negative Number of Prenatal Visits:greater than 3 verified prenatal visits Other Comments:  None  Results for orders placed or performed during the hospital encounter of 08/01/22 (from the past 24 hour(s))  Type and screen   Collection Time: 08/01/22  5:50 AM  Result Value Ref Range   ABO/RH(D) PENDING    Antibody Screen PENDING    Sample Expiration       08/04/2022,2359 Performed at [facility], [address] [place], [place] [address]     Patient Active Problem List   Diagnosis Date Noted   Maternal obesity affecting pregnancy, antepartum 08/01/2022   LGSIL on Pap smear of cervix 03/06/2022   Obesity affecting pregnancy 03/06/2022   Supervision of other normal pregnancy, antepartum 02/07/2022   Elevated blood pressure reading without diagnosis  of hypertension 08/18/2021   Anxiety    Depression     Assessment/Plan:  Erika Klein is a 26 y.o. [id number] at [redacted]w[redacted]d here for IOL elective  #Labor: pitocin started, expectant management #Pain: Support, IV pain meds, epidural upon req #FWB: CAT 1 #ID:  GBS neg #MOF: Breast #MOC:POPs #Circ:  yes  Shelda Pal, DO  08/01/2022, 6:07 AM

## 2022-08-01 NOTE — Progress Notes (Addendum)
Erika Klein is a 26 y.o. [id number] at [redacted]w[redacted]d   Subjective: Comfortable with epidural  Objective: BP (!) 116/54   Pulse 82   Temp 98.3 F (36.8 C) (Oral)   Resp 16   LMP 10/16/2021 (Exact Date)   SpO2 99%  No intake/output data recorded. No intake/output data recorded.  FHT:  FHR: 130 bpm, variability: moderate,  accelerations:  Present,  decelerations:  Absent UC:   regular, every 3-4 minutes SVE:   Dilation: 4 Effacement (%): 80 Station: -2 Exam by:: Ronalee Belts RN and Adonis Brook RN  Labs: Lab Results  Component Value Date   WBC 4.6 08/01/2022   HGB 10.8 (L) 08/01/2022   HCT 32.8 (L) 08/01/2022   MCV 94.3 08/01/2022   PLT 136 (L) 08/01/2022    Assessment / Plan: Induction of labor due to BMI,  progressing well on pitocin  Labor: Progressing normally, AROM for clear fluid, will continue to titrate Pitocin prn. Fetal Wellbeing:  Category I Pain Control:  Epidural I/D:   GBS- Anticipated MOD:  NSVD  Hollace Hayward, Student-MidWife 08/01/2022, 2:04 PM   Attestation of CNM Supervision of Midwife Student: Evaluation and management procedures were performed by the midwife student under my supervision. I was immediately available for direct supervision, assistance and direction throughout this encounter.  I also confirm that I have verified the information documented in the resident's note, and that I have also personally reperformed the pertinent components of the physical exam and all of the medical decision making activities.  I have also made any necessary editorial changes.  Patient now 5cm. R/B/A of AROM discussed. AROM with clear fluid. Patient and fetus tolerated well. Continue Pitocin titration prn  Renee Harder, CNM 08/01/2022 2:12 PM

## 2022-08-01 NOTE — Lactation Note (Signed)
This note was copied from a baby's chart. Lactation Consultation Note  Patient Name: Erika Klein [id number] Date: 08/01/2022 Reason for consult: Term ([place] called the Krystal Eaton the L/D RN, and per RN baby had latched for a few minutes after delivery and is on the warmer to be weighed. LC will F/U on MBU . Baby > 84 mins old.) Age:25 hours LC asked the L/D RN to document the feeding after delivery.  Feeding preference - Breast / Formula     Feeding Mother's Current Feeding Choice: Breast Milk and Formula  LATCH Score  - per L/D RN baby had latched for a few minutes after birth.       Consult Status       Jerlyn Ly [facility] 08/01/2022, 4:49 PM

## 2022-08-01 NOTE — Anesthesia Procedure Notes (Addendum)
Epidural Patient location during procedure: OB Start time: 08/01/2022 8:38 AM End time: 08/01/2022 8:50 AM  Staffing Anesthesiologist: Oleta Mouse, MD Performed: anesthesiologist   Preanesthetic Checklist Completed: patient identified, IV checked, risks and benefits discussed, monitors and equipment checked, pre-op evaluation and timeout performed  Epidural Patient position: sitting Prep: DuraPrep Patient monitoring: heart rate, continuous pulse ox and blood pressure Approach: midline Location: L3-L4 Injection technique: LOR saline  Needle:  Needle type: Tuohy  Needle gauge: 17 G Needle length: 9 cm Needle insertion depth: 7 cm Catheter type: closed end flexible Catheter size: 19 Gauge Catheter at skin depth: 14 cm Test dose: negative and 2% lidocaine with Epi 1:200 K  Assessment Events: blood not aspirated, injection not painful, no injection resistance, no paresthesia and negative IV test

## 2022-08-02 MED ORDER — TETANUS-DIPHTH-ACELL PERTUSSIS 5-2.5-18.5 LF-MCG/0.5 IM SUSY: 0.5000 mL | PREFILLED_SYRINGE | Freq: Once | INTRAMUSCULAR | 0 refills | Status: AC

## 2022-08-02 NOTE — Anesthesia Preprocedure Evaluation (Signed)
Anesthesia Evaluation  Patient identified by MRN, date of birth, ID band Patient awake    Reviewed: Allergy & Precautions, Patient's Chart, lab work & pertinent test results  History of Anesthesia Complications Negative for: history of anesthetic complications  Airway Mallampati: III  TM Distance: >3 FB Neck ROM: Full    Dental  (+) Teeth Intact, Dental Advisory Given   Pulmonary neg pulmonary ROS, asthma ,    breath sounds clear to auscultation       Cardiovascular negative cardio ROS   Rhythm:Regular     Neuro/Psych negative neurological ROS  negative psych ROS   GI/Hepatic negative GI ROS, Neg liver ROS,   Endo/Other  Morbid obesity  Renal/GU negative Renal ROS     Musculoskeletal negative musculoskeletal ROS (+)   Abdominal   Peds  Hematology negative hematology ROS (+)   Anesthesia Other Findings   Reproductive/Obstetrics (+) Pregnancy                             Anesthesia Physical Anesthesia Plan  ASA: 3  Anesthesia Plan: Epidural   Post-op Pain Management:    Induction:   PONV Risk Score and Plan: 2 and Treatment may vary due to age or medical condition  Airway Management Planned:   Additional Equipment: None  Intra-op Plan:   Post-operative Plan:   Informed Consent: I have reviewed the patients History and Physical, chart, labs and discussed the procedure including the risks, benefits and alternatives for the proposed anesthesia with the patient or authorized representative who has indicated his/her understanding and acceptance.       Plan Discussed with:   Anesthesia Plan Comments:         Anesthesia Quick Evaluation

## 2022-08-02 NOTE — Plan of Care (Signed)
Discharge instructions given with after visit summary, pt receptive.  

## 2022-08-02 NOTE — Discharge Summary (Addendum)
Postpartum Discharge Summary     Patient Name: Erika Klein DOB: 02-17-1996 MRN: [id number]  Date of admission: 08/01/2022 Delivery date:08/01/2022  Delivering provider: Ilean China R  Date of discharge: 08/02/2022  Admitting diagnosis: Maternal obesity affecting pregnancy, antepartum [O99.210] Intrauterine pregnancy: [redacted]w[redacted]d    Secondary diagnosis:  Principal Problem:   Maternal obesity affecting pregnancy, antepartum Active Problems:   SVD (spontaneous vaginal delivery)   Elevated blood pressure reading without diagnosis of hypertension   Supervision of other normal pregnancy, antepartum  Additional problems: Obesity    Discharge diagnosis:  SVD                                               Post partum procedures: N/A Augmentation: AROM and Pitocin Complications: None  Hospital course: Induction of Labor With Vaginal Delivery      26y.o. yo [id number] admitted for elective IOL on 08/01/2022. Patient had an uncomplicated labor course as follows:  Membrane Rupture Time/Date: 1:56 PM ,08/01/2022   Delivery Method:Vaginal, Spontaneous  Episiotomy: None  Lacerations:  None  Patient had an uncomplicated postpartum course.  She is ambulating, tolerating a regular diet, passing flatus, and urinating well. Patient is discharged home in stable condition on 08/02/22.  Newborn Data: Birth date:08/01/2022  Birth time:3:26 PM  Gender:Female  Living status:Living  Apgars:9 ,9  Weight:3080 g   Magnesium Sulfate received: No BMZ received: No Rhophylac:N/A MMR:N/A T-DaP: declined Flu: declined Transfusion:No  Physical exam  Vitals:   08/01/22 2231 08/02/22 0253 08/02/22 0552 08/02/22 0711  BP: 132/72 119/60 137/73 131/78  Pulse: 62 78 71   Resp: 18 18 18    Temp: 99.3 F (37.4 C) 99.4 F (37.4 C) 98.7 F (37.1 C)   TempSrc: Oral Oral Oral   SpO2: 100% 97% 99%    General: alert, cooperative, and no distress Lochia: appropriate Uterine Fundus:  firm Incision: N/A DVT Evaluation: No evidence of DVT seen on physical exam. Labs: Lab Results  Component Value Date   WBC 4.6 08/01/2022   HGB 10.8 (L) 08/01/2022   HCT 32.8 (L) 08/01/2022   MCV 94.3 08/01/2022   PLT 136 (L) 08/01/2022      Latest Ref Rng & Units 08/16/2021    1:12 PM  CMP  Glucose 70 - 99 mg/dL 70   BUN 6 - 20 mg/dL 10   Creatinine 0.44 - 1.00 mg/dL 0.79   Sodium 135 - 145 mmol/L 135   Potassium 3.5 - 5.1 mmol/L 3.5   Chloride 98 - 111 mmol/L 104   CO2 22 - 32 mmol/L 23   Calcium 8.9 - 10.3 mg/dL 8.9   Total Protein 6.5 - 8.1 g/dL 6.0   Total Bilirubin 0.3 - 1.2 mg/dL 0.7   Alkaline Phos 38 - 126 U/L 148   AST 15 - 41 U/L 20   ALT 0 - 44 U/L 14    Edinburgh Score:    08/02/2022    2:53 AM  Edinburgh Postnatal Depression Scale Screening Tool  I have been able to laugh and see the funny side of things. 0  I have looked forward with enjoyment to things. 0  I have blamed myself unnecessarily when things went wrong. 2  I have been anxious or worried for no good reason. 1  I have felt scared or panicky for no good  reason. 1  Things have been getting on top of me. 0  I have been so unhappy that I have had difficulty sleeping. 0  I have felt sad or miserable. 0  I have been so unhappy that I have been crying. 1  The thought of harming myself has occurred to me. 0  Edinburgh Postnatal Depression Scale Total 5     After visit meds:  Allergies as of 08/02/2022   No Known Allergies      Medication List     TAKE these medications    albuterol 108 (90 Base) MCG/ACT inhaler Commonly known as: VENTOLIN HFA Inhale 2 puffs into the lungs every 6 (six) hours as needed for wheezing or shortness of breath.   PrePLUS 27-1 MG Tabs Take 1 tablet by mouth daily.   Tdap 5-2.5-18.5 LF-MCG/0.5 injection Commonly known as: BOOSTRIX Inject 0.5 mLs into the muscle once for 1 dose.         Discharge home in stable condition Infant Feeding: Bottle and  Breast Infant Disposition:home with mother Discharge instruction: per After Visit Summary and Postpartum booklet. Activity: Advance as tolerated. Pelvic rest for 6 weeks.  Diet: routine diet Future Appointments: Future Appointments  Date Time Provider Jane  08/09/2022  2:00 PM [facility] NURSE [facility]  08/27/2022  3:15 PM Autry-Lott, Naaman Plummer, DO [facility]   Follow up Visit:   Please schedule this patient for a In person postpartum visit in 6 weeks with the following provider: Any provider. Additional Postpartum F/U: N/A   Low risk pregnancy complicated by:  N/A Delivery mode:  Vaginal, Spontaneous  Anticipated Birth Control:  POPs   08/02/2022 Deloria Lair, DO  CNM attestation I have seen and examined this patient and agree with above documentation in the resident's note.   Erika Klein is a 26 y.o. [phone] s/p vag del.   Pain is well controlled.  Plan for birth control is oral progesterone-only contraceptive.  Method of Feeding: both  PE:  BP 131/78 (BP Location: Right Arm)   Pulse 71   Temp 98.7 F (37.1 C) (Oral)   Resp 18   LMP 10/16/2021 (Exact Date)   SpO2 99%   Breastfeeding Unknown  Fundus firm  Recent Labs    08/01/22 0550  HGB 10.8*  HCT 32.8*     Plan: discharge today - postpartum care discussed - f/u clinic in 4-6 weeks for postpartum visit  Myrtis Ser, CNM 2:24 PM 08/02/2022

## 2022-08-02 NOTE — Social Work (Signed)
MOB was referred for history of depression/anxiety.  * Referral screened out by Clinical Social Worker because none of the following criteria appear to apply:  ~ History of anxiety/depression during this pregnancy, or of post-partum depression following prior delivery.  ~ Diagnosis of anxiety and/or depression within last 3 years OR * MOB's symptoms currently being treated with medication and/or therapy.  Per chart review MOB was diagnosed prior to September 2020, MOB has had no noted symptoms during this pregnancy.  Please contact the Clinical Social Worker if needs arise or by MOB request.   Wyndham Santilli, LCSWA Clinical Social Worker [phone] 

## 2022-08-02 NOTE — Anesthesia Postprocedure Evaluation (Signed)
Anesthesia Post Note  Patient: Erika Klein  Procedure(s) Performed: AN AD HOC LABOR EPIDURAL     Patient location during evaluation: Mother Baby Anesthesia Type: Epidural Level of consciousness: awake and alert Pain management: pain level controlled Vital Signs Assessment: post-procedure vital signs reviewed and stable Respiratory status: spontaneous breathing, nonlabored ventilation and respiratory function stable Cardiovascular status: stable Postop Assessment: no headache, no backache and epidural receding Anesthetic complications: no   No notable events documented.  Last Vitals:  Vitals:   08/02/22 0552 08/02/22 0711  BP: 137/73 131/78  Pulse: 71   Resp: 18   Temp: 37.1 C   SpO2: 99%     Last Pain:  Vitals:   08/02/22 0553  TempSrc:   PainSc: 0-No pain   Pain Goal:                   [name]

## 2022-08-02 NOTE — Lactation Note (Signed)
This note was copied from a baby's chart. Lactation Consultation Note  Patient Name: Erika Klein [id number] Date: 08/02/2022 Reason for consult: Mother's request;Follow-up assessment;Term Age:25 hours  Mom reports having a hard-time latching baby herself. Mom said she does not like the football hold. I helped Mom latch in a cradle hold. Using the teacup hold, infant latched with ease and swallows were verified by cervical auscultation.   I re-entered the room a short time later to ask Mom a question and saw that the infant was sucking on a pacifier. I helped to relatch infant. RN encouraged Mom not to use a pacifier.   Mom knows that I am willing to come back to help her latch in a side-lying position.   Maternal Data How long did the patient breastfeed?: 2 mo  Feeding Mother's Current Feeding Choice: Breast Milk and Formula  LATCH Score Latch: Grasps breast easily, tongue down, lips flanged, rhythmical sucking.  Audible Swallowing: Spontaneous and intermittent  Type of Nipple: Everted at rest and after stimulation  Comfort (Breast/Nipple): Soft / non-tender  Hold (Positioning): Assistance needed to correctly position infant at breast and maintain latch.  LATCH Score: 9   Interventions Interventions: Breast feeding basics reviewed;Assisted with latch;Skin to skin;Breast massage;Hand express;Adjust position;Support pillows  Discharge Pump: Manual (RN to provide a manual pump with a size 21 flange)  Consult Status Consult Status: PRN (Mom might call out for assist with trying side-lying position later) Follow-up type: In-patient    Matthias Hughs [facility] 08/02/2022, 12:14 PM

## 2022-08-06 ENCOUNTER — Encounter: Payer: Self-pay | Admitting: Family Medicine

## 2022-08-06 ENCOUNTER — Other Ambulatory Visit: Payer: Self-pay

## 2022-08-09 ENCOUNTER — Telehealth: Payer: Self-pay

## 2022-08-09 ENCOUNTER — Ambulatory Visit: Payer: Medicaid Other

## 2022-08-09 ENCOUNTER — Telehealth (HOSPITAL_COMMUNITY): Payer: Self-pay | Admitting: *Deleted

## 2022-08-09 NOTE — Telephone Encounter (Signed)
Attempted Hospital Discharge Follow-Up Call.  Left voice mail requesting that patient return RN's phone call if patient has any concerns or questions regarding herself or her baby.  

## 2022-08-09 NOTE — Telephone Encounter (Signed)
Called pt to follow up on missed visit for BP check; VM left encouraging patient to reschedule. Call back number given.

## 2022-08-27 ENCOUNTER — Ambulatory Visit: Payer: Medicaid Other | Admitting: Family Medicine

## 2022-08-27 NOTE — Progress Notes (Incomplete)
    Erika Klein Visit Note  Erika Klein is a 26 y.o. [phone] female who presents for a postpartum visit. She is 3 weeks postpartum following a normal spontaneous vaginal delivery.  I have fully reviewed the prenatal and intrapartum course. The delivery was at 40 gestational weeks.  Anesthesia: epidural. Postpartum course has been ***. Baby is doing well***. Baby is feeding by {breastmilk/bottle:69}. Bleeding {vag bleed:12292}. Bowel function is {normal:32111}. Bladder function is {normal:32111}. Patient {is/is not:9024} sexually active. Contraception method is oral progesterone-only contraceptive. Postpartum depression screening: {gen negative/positive:315881}.   The pregnancy intention screening data noted above was reviewed. Potential methods of contraception were discussed. The patient elected to proceed with No data recorded.    Health Maintenance Due  Topic Date Due   COVID-19 Vaccine (1) Never done   HPV VACCINES (1 - 2-dose series) Never done   TETANUS/TDAP  Never done   INFLUENZA VACCINE  Never done    {Common ambulatory SmartLinks:19316}  Review of Systems {ros; complete:30496}  Objective:  LMP 10/16/2021 (Exact Date)    General:  {gen appearance:16600}   Breasts:  {desc; normal/abnormal/not indicated:14647}  Lungs: {lung exam:16931}  Heart:  {heart exam:5510}  Abdomen: {abdomen exam:16834}   Wound {Wound assessment:11097}  GU exam:  {desc; normal/abnormal/not indicated:14647}       Assessment:    There are no diagnoses linked to this encounter.  *** postpartum exam.   Plan:   Essential components of care per ACOG recommendations:  1.  Mood and well being: Patient with {gen negative/positive:315881} depression screening today. Reviewed local resources for support.  - Patient tobacco use? {tobacco use:25506}  - hx of drug use? {yes/no:25505}    2. Infant care and feeding:  -Patient currently breastmilk feeding? {yes/no:25502}  -Social determinants of  health (SDOH) reviewed in EPIC. No concerns***The following needs were identified***  3. Sexuality, contraception and birth spacing - Patient {DOES_DOES AVW:09811} want a pregnancy in the next year.  Desired family size is {NUMBER 1-10:22536} children.  - Reviewed reproductive life planning. Reviewed contraceptive methods based on pt preferences and effectiveness.  Patient desired {Upstream End Methods:24109} today.   - Discussed birth spacing of 18 months  4. Sleep and fatigue -Encouraged family/partner/community support of 4 hrs of uninterrupted sleep to help with mood and fatigue  5. Physical Recovery  - Discussed patients delivery and complications. She describes her labor as {description:25511} - Patient had a {CHL AMB DELIVERY:[phone]}. Patient had a {laceration:25518} laceration. Perineal healing reviewed. Patient expressed understanding - Patient has urinary incontinence? {yes/no:25515} - Patient {ACTION; IS/IS BJY:[id number]} safe to resume physical and sexual activity  6.  Health Maintenance - HM due items addressed {Yes or If no, why not?:20788} - Last pap smear  Diagnosis  Date Value Ref Range Status  10/02/2021 - Low grade squamous intraepithelial lesion (LSIL) (A)  Final   Pap smear {done:10129} at today's visit.  -Breast Cancer screening indicated? {indicated:25516}  7. Chronic Disease/Pregnancy Condition follow up: {Follow up:25499}  - PCP follow up  Gerlene Fee, [place] [facility] [place]

## 2022-10-25 IMAGING — US US MFM OB FOLLOW-UP
1 series · 13 of 23 positions shown · non-contrast
Comparison: none

[Series 1: us mfm ob follow-up · 23 acquisitions, 13 frames shown]
[im 1/23]
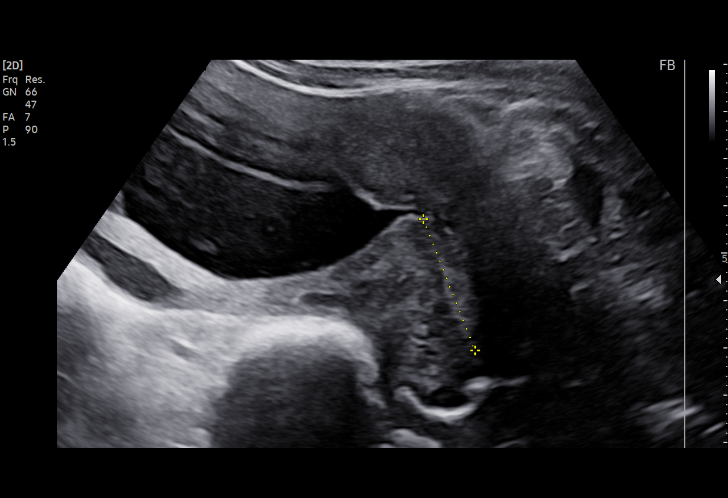
[im 3/23]
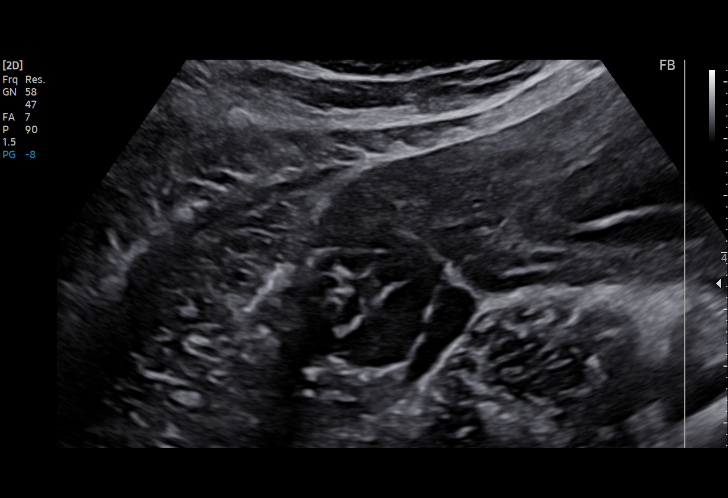
[im 5/23]
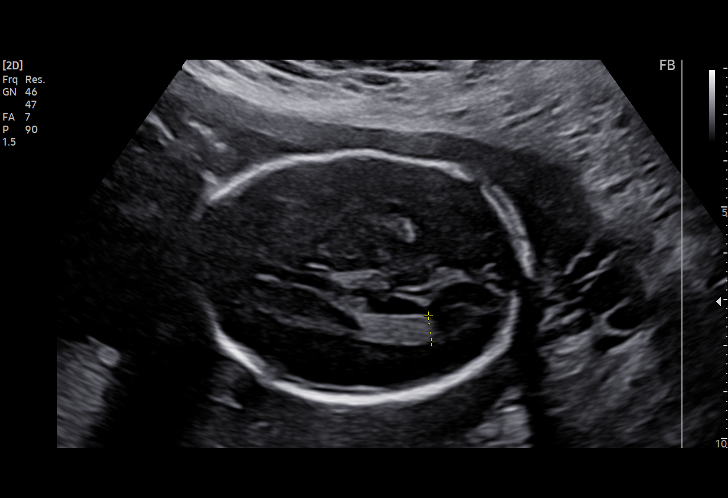
[im 7/23]
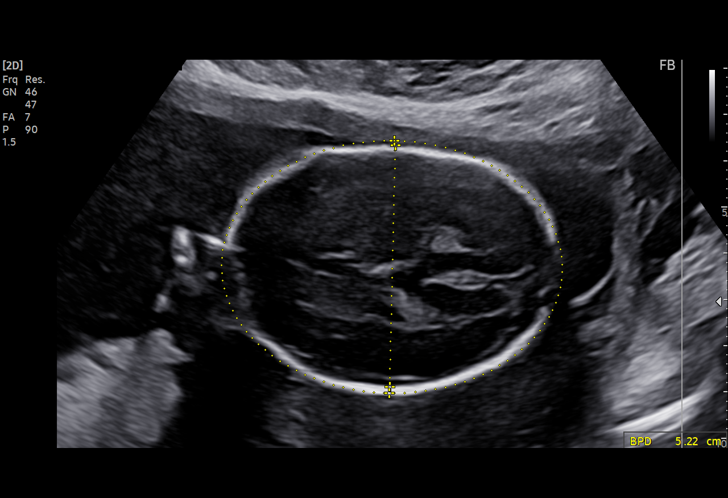
[im 8/23]
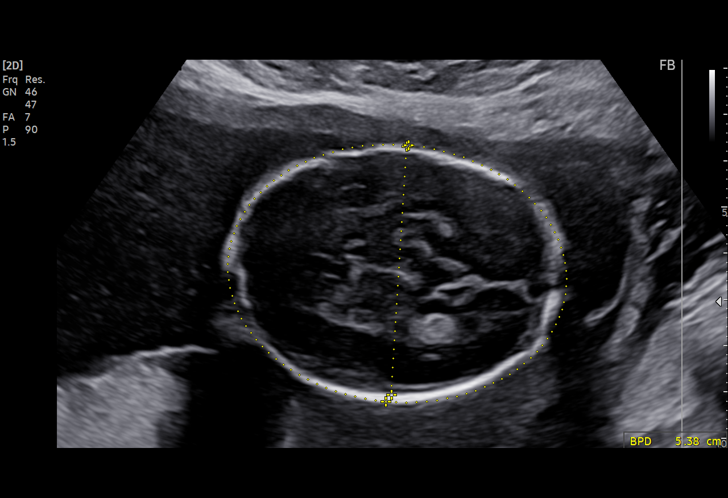
[im 10/23]
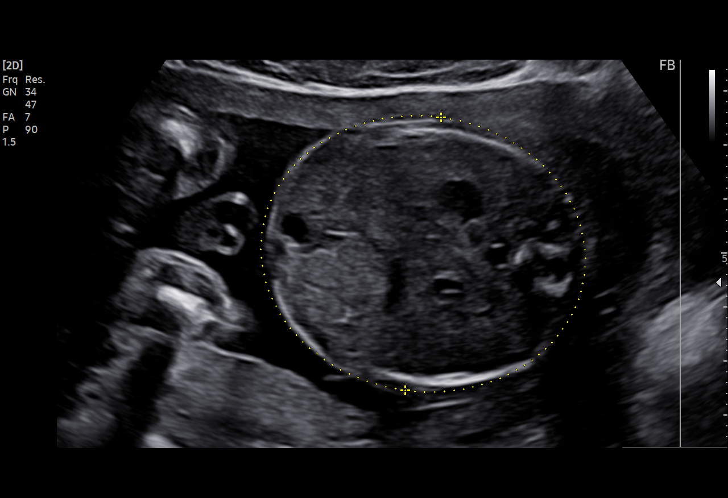
[im 12/23]
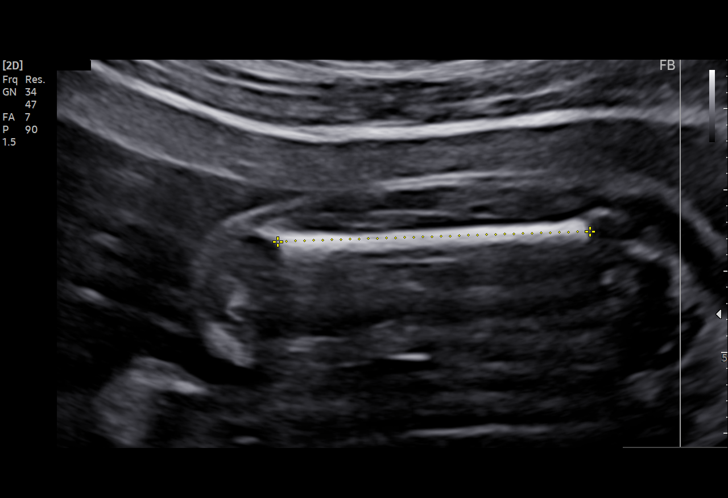
[im 14/23]
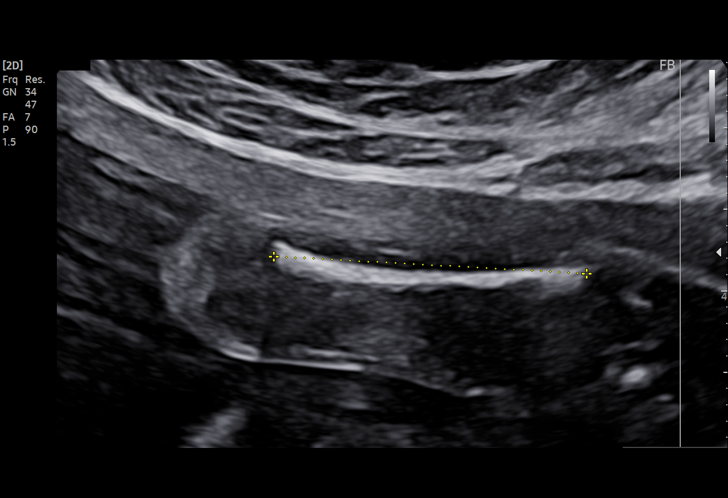
[im 16/23]
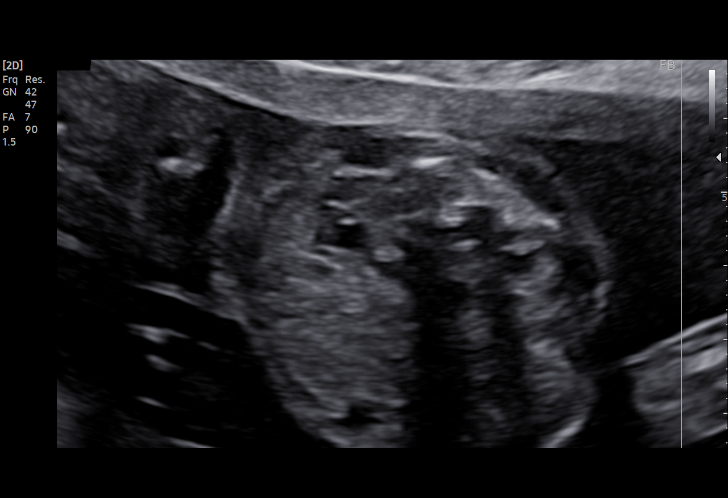
[im 17/23]
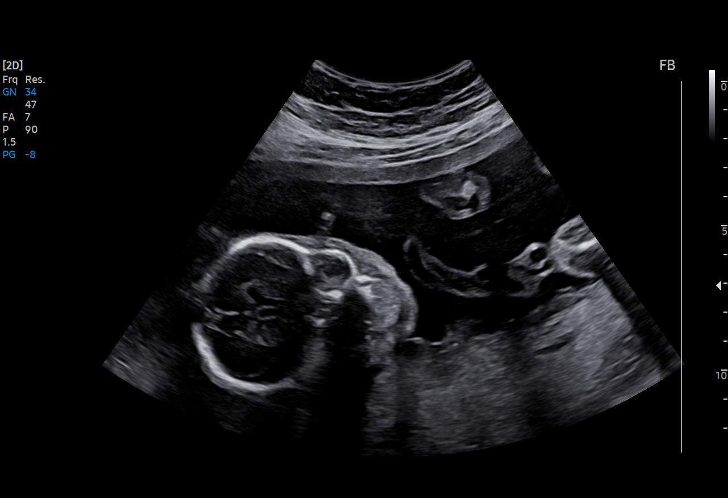
[im 19/23]
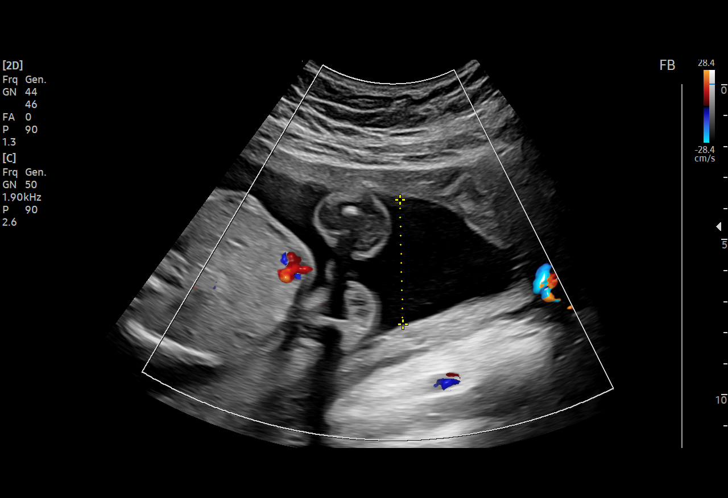
[im 21/23]
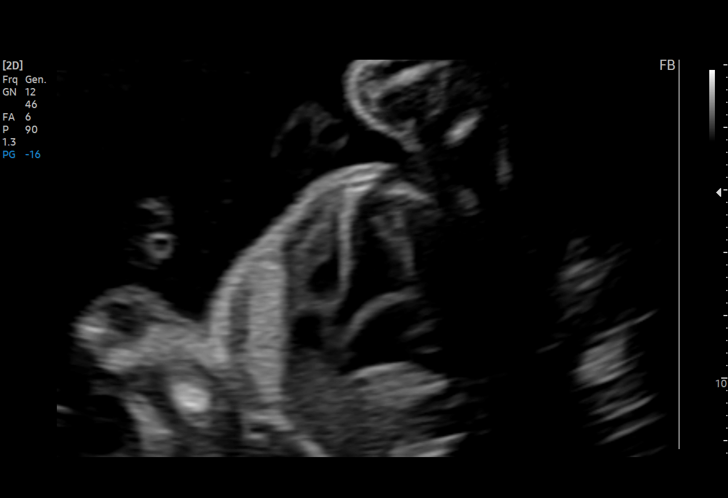
[im 23/23]
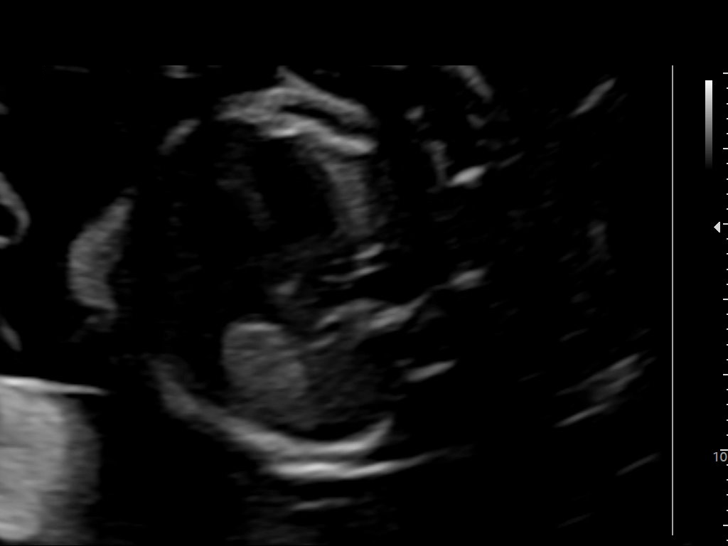

[13 of 23 positions shown; findings below may reference images not displayed]

Indications

 Obesity complicating pregnancy, second
 trimester (BMI 43)
 Short interval between pregancies, 2nd
 trimester (delivered 08/16/21)
 Poor obstetric history: Previous fetal growth
 restriction (FGR)
 Late prenatal care, second trimester
 22 weeks gestation of pregnancy
Fetal Evaluation

 Num Of Fetuses:         1
 Fetal Heart Rate(bpm):  130
 Cardiac Activity:       Observed
 Presentation:           Transverse, head to maternal left
 Placenta:               Posterior
 P. Cord Insertion:      Previously Visualized

 Amniotic Fluid
 AFI FV:      Within normal limits

                             Largest Pocket(cm)
                             4
Biometry
 BPD:      53.2  mm     G. Age:  22w 1d         36  %    CI:         69.1   %    70 - 86
                                                         FL/HC:      18.8   %    18.4 -
 HC:      204.4  mm     G. Age:  22w 4d         42  %    HC/AC:      1.17        1.06 -
 AC:      174.8  mm     G. Age:  22w 3d         41  %    FL/BPD:     72.2   %    71 - 87
 FL:       38.4  mm     G. Age:  22w 2d         35  %    FL/AC:      22.0   %    20 - 24

 LV:        5.7  mm

 Est. FW:     498  gm      1 lb 2 oz     40  %
OB History

 Blood Type:   O+
 Gravidity:    4         Term:   2         SAB:   1
 Living:       2
Gestational Age

 LMP:           23w 4d        Date:  10/16/21                 EDD:   07/23/22
 U/S Today:     22w 3d                                        EDD:   07/31/22
 Best:          22w 3d     Det. By:  U/S  (03/01/22)          EDD:   07/31/22
Anatomy

 Cranium:               Appears normal         LVOT:                   Appears normal
 Cavum:                 Appears normal         Aortic Arch:            Previously seen
 Ventricles:            Appears normal         Ductal Arch:            Previously seen
 Choroid Plexus:        Previously seen        Diaphragm:              Appears normal
 Cerebellum:            Previously seen        Stomach:                Appears normal, left
                                                                       sided
 Posterior Fossa:       Previously seen        Abdomen:                Appears normal
 Nuchal Fold:           Previously seen        Abdominal Wall:         Previously seen
 Face:                  Orbits and profile     Cord Vessels:           Previously seen
                        previously seen
 Lips:                  Previously seen        Kidneys:                Appear normal
 Palate:                Previously seen        Bladder:                Appears normal
 Thoracic:              Appears normal         Spine:                  Previously seen
 Heart:                 Appears normal         Upper Extremities:      Previously seen
                        (4CH, axis, and
                        situs)
 RVOT:                  Appears normal         Lower Extremities:      Previously seen

 Other:  Male gender previously seen. VC, 3VV and 3VTV, Heels/feet and
         open hands/5th digits, Nasal bone, lenses, maxilla, mandible and falx
         previously visualized
Cervix Uterus Adnexa

 Cervix
 Length:              3  cm.
 Normal appearance by transabdominal scan.
Impression

 Fetal growth is appropriate for gestational age .Amniotic fluid
 is normal and good fetal activity is seen .
Recommendations
 -An appointment was made for her to return in 6 weeks for
 fetal growth assessment.
                Onacram, Don Lolito

## 2022-11-26 ENCOUNTER — Other Ambulatory Visit: Payer: Self-pay

## 2022-11-26 ENCOUNTER — Encounter: Payer: Self-pay | Admitting: Obstetrics and Gynecology

## 2022-11-26 ENCOUNTER — Other Ambulatory Visit (HOSPITAL_COMMUNITY)
Admission: RE | Admit: 2022-11-26 | Discharge: 2022-11-26 | Disposition: A | Discharge status: Home / Self Care | Payer: Medicaid Other | Source: Ambulatory Visit | Attending: Obstetrics and Gynecology | Admitting: Obstetrics and Gynecology

## 2022-11-26 ENCOUNTER — Ambulatory Visit (INDEPENDENT_AMBULATORY_CARE_PROVIDER_SITE_OTHER): Payer: Medicaid Other | Admitting: Obstetrics and Gynecology

## 2022-11-26 VITALS — BP 137/77 | HR 80 | Wt 253.7 lb

## 2022-11-26 DIAGNOSIS — Z6841 Body Mass Index (BMI) 40.0 and over, adult: Secondary | ICD-10-CM | POA: Diagnosis not present

## 2022-11-26 DIAGNOSIS — Z113 Encounter for screening for infections with a predominantly sexual mode of transmission: Secondary | ICD-10-CM | POA: Diagnosis present

## 2022-11-26 DIAGNOSIS — Z30011 Encounter for initial prescription of contraceptive pills: Secondary | ICD-10-CM | POA: Diagnosis not present

## 2022-11-26 DIAGNOSIS — Z01419 Encounter for gynecological examination (general) (routine) without abnormal findings: Secondary | ICD-10-CM | POA: Diagnosis present

## 2022-11-26 MED ORDER — NORETHIN ACE-ETH ESTRAD-FE 1-20 MG-MCG(24) PO TABS: 1.0000 | ORAL_TABLET | Freq: Every day | ORAL | 11 refills | Status: DC

## 2022-11-26 NOTE — Progress Notes (Signed)
GYNECOLOGY ANNUAL PREVENTATIVE CARE ENCOUNTER NOTE  History:     Erika Klein is a 27 y.o. [phone] female here for a routine annual gynecologic exam.  Current complaints: none.   Denies abnormal vaginal bleeding, discharge, pelvic pain, problems with intercourse or other gynecologic concerns.    Gynecologic History Patient's last menstrual period was 10/29/2022 (exact date). Contraception: condoms Last Pap: 2022. Results were: abnormal with LGSIL  Obstetric History OB History  Gravida Para Term Preterm AB Living  4 3 3   1 3   SAB IAB Ectopic Multiple Live Births    1   0 3    # Outcome Date GA Lbr Len/2nd Weight Sex Delivery Anes PTL Lv  4 Term 08/01/22 [redacted]w[redacted]d 05:44 / 00:12 6 lb 12.6 oz (3.08 kg) M Vag-Spont EPI  LIV  3 Term 2022    M Vag-Spont   LIV  2 Term 01/16/19 [redacted]w[redacted]d  5 lb 11 oz (2.58 kg) F Vag-Spont EPI  LIV     Birth Comments: ? IUGR  1 IAB 09/25/17             Birth Comments: was twin pregnancy, one died early and was given abortion pill because baby wasn't viable    Past Medical History:  Diagnosis Date   Anxiety    Asthma    Depression     Past Surgical History:  Procedure Laterality Date   anxiety     NO PAST SURGERIES      Current Outpatient Medications on File Prior to Visit  Medication Sig Dispense Refill   albuterol (VENTOLIN HFA) 108 (90 Base) MCG/ACT inhaler Inhale 2 puffs into the lungs every 6 (six) hours as needed for wheezing or shortness of breath. (Patient not taking: Reported on 08/02/2022) 8 g 2   Prenatal Vit-Fe Fumarate-FA (PREPLUS) 27-1 MG TABS Take 1 tablet by mouth daily. (Patient not taking: Reported on 11/26/2022) 30 tablet 11   No current facility-administered medications on file prior to visit.    No Known Allergies  Social History:  reports that she has never smoked. She has never used smokeless tobacco. She reports that she does not currently use alcohol. She reports that she does not use drugs.  Family History  Problem  Relation Age of Onset   Schizophrenia Mother    Bipolar disorder Mother    Anxiety disorder Mother    Cancer Mother    Cancer - Cervical Mother    Anxiety disorder Father    Asthma Father    Hypertension Maternal Aunt    Hypertension Maternal Grandmother    Stroke Paternal Grandmother    Hypertension Paternal Grandmother    Diabetes Neg Hx    Heart disease Neg Hx     The following portions of the patient's history were reviewed and updated as appropriate: allergies, current medications, past family history, past medical history, past social history, past surgical history and problem list.  Review of Systems Pertinent items noted in HPI and remainder of comprehensive ROS otherwise negative.  Physical Exam:  BP 137/77   Pulse 80   Wt 253 lb 11.2 oz (115.1 kg)   LMP 10/29/2022 (Exact Date)   BMI 44.94 kg/m  CONSTITUTIONAL: Well-developed, well-nourished female in no acute distress.  HENT:  Normocephalic, atraumatic, External right and left ear normal.  EYES: Conjunctivae and EOM are normal.  NECK: Normal range of motion, supple, no masses.  Normal thyroid.  SKIN: Skin is warm and dry. No rash noted. Not diaphoretic. No  erythema. No pallor. MUSCULOSKELETAL: Normal range of motion. No tenderness.  No cyanosis, clubbing, or edema.  2+ distal pulses. NEUROLOGIC: Alert and oriented to person, place, and time. Normal reflexes, muscle tone coordination.  PSYCHIATRIC: Normal mood and affect. Normal behavior. Normal judgment and thought content. CARDIOVASCULAR: Normal heart rate noted, regular rhythm RESPIRATORY: Clear to auscultation bilaterally. Effort and breath sounds normal, no problems with respiration noted. BREASTS: deferred ABDOMEN: Soft, no distention noted.  No tenderness, rebound or guarding.  PELVIC: Normal appearing external genitalia and urethral meatus; normal appearing vaginal mucosa and cervix.  No abnormal discharge noted.  Pap smear obtained. Vaginal swab taken Normal  uterine size, no other palpable masses, no uterine or adnexal tenderness.  Performed in the presence of a chaperone.   Assessment and Plan:    1. Women's annual routine gynecological examination Normal annual exam  2. Routine screening for STI (sexually transmitted infection) Per pt request  3.  Initiation of birth control pills Rx for loestrin 24.  Pt advised to return in 3 months for blood pressure check Repeat annual exam in 1 year pending pap results.  Will follow up results of pap smear and manage accordingly. Routine preventative health maintenance measures emphasized. Please refer to After Visit Summary for other counseling recommendations.      Lynnda Shields, MD, [place] [facility], [place]

## 2022-11-27 LAB — CERVICOVAGINAL ANCILLARY ONLY
Bacterial Vaginitis (gardnerella): POSITIVE — AB
Candida Glabrata: NEGATIVE
Candida Vaginitis: NEGATIVE
Chlamydia: NEGATIVE
Comment: NEGATIVE
Comment: NEGATIVE
Comment: NEGATIVE
Comment: NEGATIVE
Comment: NEGATIVE
Comment: NORMAL
Neisseria Gonorrhea: NEGATIVE
Trichomonas: NEGATIVE

## 2022-11-27 LAB — HIV ANTIBODY (ROUTINE TESTING W REFLEX): HIV Screen 4th Generation wRfx: NONREACTIVE

## 2022-11-27 LAB — HEPATITIS B SURFACE ANTIGEN: Hepatitis B Surface Ag: NEGATIVE

## 2022-11-27 LAB — SYPHILIS: RPR W/REFLEX TO RPR TITER AND TREPONEMAL ANTIBODIES, TRADITIONAL SCREENING AND DIAGNOSIS ALGORITHM: RPR Ser Ql: NONREACTIVE

## 2022-11-27 LAB — HEPATITIS C ANTIBODY: Hep C Virus Ab: NONREACTIVE

## 2022-11-28 ENCOUNTER — Telehealth: Payer: Self-pay | Admitting: *Deleted

## 2022-11-28 MED ORDER — METRONIDAZOLE 500 MG PO TABS: 500.0000 mg | ORAL_TABLET | Freq: Two times a day (BID) | ORAL | 0 refills | Status: DC

## 2022-11-28 NOTE — Telephone Encounter (Addendum)
-----  Message from Griffin Basil, MD sent at 11/27/2022 11:39 PM EST ----- BV noted on swab, will offer treatment, remainder of STD panel normal  1/24  1455 Called pt and informed her of test result. Rx will be sent to her pharmacy - dose instructions given. Pt asked if her urine had been checked for infection and pregnancy. Per chart review, these tests were not performed - pt informed. She denies current sx of UTI. She reports LMP 10/29/22. I advised that she wait one week and if no period, she can schedule nurse appt for UPT.  She voiced understanding of all information and instructions given.

## 2022-11-30 LAB — CYTOLOGY - PAP: Diagnosis: NEGATIVE

## 2023-01-04 ENCOUNTER — Encounter: Payer: Self-pay | Admitting: Obstetrics and Gynecology

## 2023-02-05 ENCOUNTER — Other Ambulatory Visit: Payer: Self-pay

## 2023-02-05 ENCOUNTER — Encounter: Payer: Self-pay | Admitting: Obstetrics & Gynecology

## 2023-02-05 DIAGNOSIS — F32A Depression, unspecified: Secondary | ICD-10-CM

## 2023-02-09 ENCOUNTER — Emergency Department (HOSPITAL_COMMUNITY)
Admission: EM | Admit: 2023-02-09 | Discharge: 2023-02-09 | Disposition: A | Discharge status: Home / Self Care | Payer: Medicaid Other | Attending: Emergency Medicine | Admitting: Emergency Medicine

## 2023-02-09 ENCOUNTER — Other Ambulatory Visit: Payer: Self-pay

## 2023-02-09 ENCOUNTER — Encounter (HOSPITAL_COMMUNITY): Payer: Self-pay

## 2023-02-09 DIAGNOSIS — J45909 Unspecified asthma, uncomplicated: Secondary | ICD-10-CM | POA: Insufficient documentation

## 2023-02-09 DIAGNOSIS — R519 Headache, unspecified: Secondary | ICD-10-CM | POA: Diagnosis present

## 2023-02-09 MED ORDER — ACETAMINOPHEN 325 MG PO TABS: 650.0000 mg | ORAL_TABLET | Freq: Once | ORAL | Status: DC

## 2023-02-09 MED ORDER — IBUPROFEN 600 MG PO TABS: 600.0000 mg | ORAL_TABLET | Freq: Three times a day (TID) | ORAL | 0 refills | Status: DC

## 2023-02-09 MED ORDER — ACETAMINOPHEN 500 MG PO TABS: 500.0000 mg | ORAL_TABLET | Freq: Four times a day (QID) | ORAL | 0 refills | Status: DC | PRN

## 2023-02-09 NOTE — Discharge Instructions (Addendum)
We saw you in the ER for headaches.  We are not sure what is causing your headaches, however, there appears to be no evidence of infection, bleeds or tumors based on our exam and results.  Please take motrin round the clock for the next 6 hours, and take other meds prescribed only for break through pain. See your doctor or a neurologist if the pain persists.  We recommend that you also get a primary care doctor soon as possible.

## 2023-02-09 NOTE — ED Notes (Signed)
This RN went to the room to give pt discharge papers and some tylenol. When this RN got the room the room was empty. Pt could not be located in the department. Will discharge pt out of the system.

## 2023-02-09 NOTE — ED Provider Notes (Signed)
  [facility] AT [facility] Provider Note   CSN: [id number] Arrival date & time: 02/09/23  1140     History {Add pertinent medical, surgical, social history, OB history to HPI:1} Chief Complaint  Patient presents with   Headache    Erika Klein is a 27 y.o. female.  HPI    Patient comes in with chief complaint of headache.  Home Medications Prior to Admission medications   Medication Sig Start Date End Date Taking? Authorizing Provider  acetaminophen (TYLENOL) 500 MG tablet Take 1 tablet (500 mg total) by mouth every 6 (six) hours as needed. 02/09/23  Yes Derwood Kaplan, MD  ibuprofen (ADVIL) 600 MG tablet Take 1 tablet (600 mg total) by mouth 3 (three) times daily. 02/09/23  Yes Derwood Kaplan, MD  albuterol (VENTOLIN HFA) 108 (90 Base) MCG/ACT inhaler Inhale 2 puffs into the lungs every 6 (six) hours as needed for wheezing or shortness of breath. Patient not taking: Reported on 08/02/2022 02/16/22   Bernerd Limbo, CNM  metroNIDAZOLE (FLAGYL) 500 MG tablet Take 1 tablet (500 mg total) by mouth 2 (two) times daily. 11/28/22   Warden Fillers, MD  Norethindrone Acetate-Ethinyl Estrad-FE (LOESTRIN 24 FE) 1-20 MG-MCG(24) tablet Take 1 tablet by mouth daily. 11/26/22   Warden Fillers, MD      Allergies    Patient has no known allergies.    Review of Systems   Review of Systems  Physical Exam Updated Vital Signs BP 124/83   Pulse 74   Temp 98.3 F (36.8 C) (Oral)   Resp 15   Ht 5\' 3"  (1.6 m)   Wt 117.9 kg   SpO2 100%   BMI 46.06 kg/m  Physical Exam  ED Results / Procedures / Treatments   Labs (all labs ordered are listed, but only abnormal results are displayed) Labs Reviewed - No data to display  EKG None  Radiology No results found.  Procedures Procedures  {Document cardiac monitor, telemetry assessment procedure when appropriate:1}  Medications Ordered in ED Medications  acetaminophen (TYLENOL) tablet 650 mg (has no  administration in time range)    ED Course/ Medical Decision Making/ A&P   {   Click here for ABCD2, HEART and other calculatorsREFRESH Note before signing :1}                          Medical Decision Making Risk OTC drugs. Prescription drug management.   ***  {Document critical care time when appropriate:1} {Document review of labs and clinical decision tools ie heart score, Chads2Vasc2 etc:1}  {Document your independent review of radiology images, and any outside records:1} {Document your discussion with family members, caretakers, and with consultants:1} {Document social determinants of health affecting pt's care:1} {Document your decision making why or why not admission, treatments were needed:1} Final Clinical Impression(s) / ED Diagnoses Final diagnoses:  Intermittent headache    Rx / DC Orders ED Discharge Orders          Ordered    ibuprofen (ADVIL) 600 MG tablet  3 times daily        02/09/23 1355    acetaminophen (TYLENOL) 500 MG tablet  Every 6 hours PRN        02/09/23 1355

## 2023-02-09 NOTE — ED Triage Notes (Signed)
Pt c/o sharp pain to back of head x 1 week, constant; denies injury; also endorses intermittent dizziness; took ASA 2 hours ago, pain improved; ambulatory with steady gait

## 2023-02-09 NOTE — ED Notes (Signed)
Patient laying in bed playing on phone at this time. States she has head headaches her whole life. States she has them about every other day. When pain is intense will have dizziness. States her mother gave her some medicine similar to Excedrin that has gotten pain 4/10.

## 2023-02-09 NOTE — ED Notes (Signed)
ED Provider at bedside. 

## 2023-02-18 ENCOUNTER — Telehealth: Payer: Self-pay | Admitting: Clinical

## 2023-02-18 NOTE — Telephone Encounter (Signed)
Attempt call regarding referral; Left HIPPA-compliant message to call back Zion Ta from [facility] at  [phone] (Shanora Christensen's office).   

## 2023-02-25 ENCOUNTER — Ambulatory Visit: Payer: Medicaid Other

## 2023-02-25 NOTE — BH Specialist Note (Unsigned)
Integrated Behavioral Health via Telemedicine Visit  02/26/2023 Erika Klein [id number]  Number of Integrated Behavioral Health Clinician visits: 1- Initial Visit  Session Start time: 1326   Session End time: 1401  Total time in minutes: 35   Referring Provider: Merian Capron, MD Patient/Family location: Home [facility] Provider location: [facility]  All persons participating in visit: Patient Erika Klein and [facility] Ester Hilley   Types of Service: Individual psychotherapy and Video visit  I connected with Erika Klein and/or Erika Klein's  n/a  via  Telephone or Video Enabled Telemedicine Application  (Video is Caregility application) and verified that I am speaking with the correct person using two identifiers. Discussed confidentiality: Yes   I discussed the limitations of telemedicine and the availability of in person appointments.  Discussed there is a possibility of technology failure and discussed alternative modes of communication if that failure occurs.  I discussed that engaging in this telemedicine visit, they consent to the provision of behavioral healthcare and the services will be billed under their insurance.  Patient and/or legal guardian expressed understanding and consented to Telemedicine visit: Yes   Presenting Concerns: Patient and/or family reports the following symptoms/concerns: Life stress, poor appetite, poor sleep quality, conflict with boyfriend,anxiety (history of panic attacks), excessive worry, depression, fatigue. Pt took Buspar for 3 days in 2019, stopped with positive pregnancy and has not taken since; open to implementing self-coping strategy to manage as well as referral to psychiatry for [facility] medication management.  Duration of problem: Ongoing, began three years ago; Severity of problem: moderate  Patient and/or Family's Strengths/Protective Factors: Sense of purpose  Goals  Addressed: Patient will:  Reduce symptoms of: anxiety, depression, insomnia, and stress   Increase knowledge and/or ability of: healthy habits and self-management skills   Demonstrate ability to: Increase healthy adjustment to current life circumstances, Increase adequate support systems for patient/family, and Increase motivation to adhere to plan of care  Progress towards Goals: Ongoing  Interventions: Interventions utilized:  Motivational Interviewing, Solution-Focused Strategies, Psychoeducation and/or Health Education, and Link to [employer] Standardized Assessments completed: GAD-7 and PHQ 9  Patient and/or Family Response: Patient agrees with treatment plan.   Assessment: Patient currently experiencing Generalized anxiety disorder; Psychosocial stress.   Patient may benefit from psychoeducation and brief therapeutic interventions regarding coping with symptoms of anxiety, depression, life stress.  Plan: Follow up with behavioral health clinician on : Two weeks Behavioral recommendations:   -Accept referral to psychiatry; consider using [facility] walk-in hours to establish care sooner for [facility] medication management -Begin Worry Time strategy, as discussed. Start by setting up start and end time reminders on phone today; continue daily for two  weeks. Referral(s): Integrated Art gallery manager (In Clinic), Community Mental Health Services (LME/Outside Clinic), and Community Resources:  childcare  I discussed the assessment and treatment plan with the patient and/or parent/guardian. They were provided an opportunity to ask questions and all were answered. They agreed with the plan and demonstrated an understanding of the instructions.   They were advised to call back or seek an in-person evaluation if the symptoms worsen or if the condition fails to improve as anticipated.  Valetta Close Yvana Samonte, LCSW      02/26/2023    1:31 PM 11/26/2022   11:03 AM 07/23/2022   11:18 AM  07/16/2022    5:21 PM 03/06/2022    1:43 PM  Depression screen PHQ 2/9  Decreased Interest 0  0 0 0  Down, Depressed, Hopeless 2 0 PHQ - 2 Score 2 0 Altered sleeping 0  Tired, decreased energy 3 0 1 0 0  Change in appetite 0 0 0 0 0  Feeling bad or failure about yourself  1 0 0 0 1  Trouble concentrating 0 0 0 0 0  Moving slowly or fidgety/restless 0 0 0 0 0  Suicidal thoughts 0 0 0 0 0  PHQ-9 Score 02/26/2023    1:33 PM 11/26/2022   11:03 AM 07/23/2022   11:18 AM 07/16/2022    5:21 PM  GAD 7 : Generalized Anxiety Score  Nervous, Anxious, on Edge 3 3 0 0  Control/stop worrying 0 0 0 1  Worry too much - different things 1 2 0 1  Trouble relaxing 1 0 0 0  Restless 0 0 0 0  Easily annoyed or irritable 1 0 0 0  Afraid - awful might happen 0 0 0 0  Total GAD 7 Score 6 5 0 2

## 2023-02-26 ENCOUNTER — Ambulatory Visit (INDEPENDENT_AMBULATORY_CARE_PROVIDER_SITE_OTHER): Payer: Medicaid Other | Admitting: Clinical

## 2023-02-26 DIAGNOSIS — Z658 Other specified problems related to psychosocial circumstances: Secondary | ICD-10-CM

## 2023-02-26 DIAGNOSIS — F411 Generalized anxiety disorder: Secondary | ICD-10-CM

## 2023-02-26 NOTE — Patient Instructions (Addendum)
[facility] [address] [place], [place] [address] [phone] (main office) [phone] (Biance Moncrief's office)  Worry Time Set timers on phone (beginning and ending of worry time) Prior to first Worry Time, write down all worries, stressors, as they come to mind in notebook At first Worry Time, use two markers (different colors); All items out of your realm of control highlight in one color (ex. Everything in the past that cannot be changed; everything that is someone else's worry, not yours). Use the second color to highlight everything remaining that you have at least some control over.  At the end of the first Worry Time, before the second timer goes off, pick the very smallest item on the list within your control (something that will take 5 minutes or less to complete) and complete that task.  Day two: decide which item(s) to prioritize for the next 24 hours; repeat daily.    [place] [employer]  (Childcare options, Early childcare development, etc.) [email or web address]   [employer] Child Care Facility Search Engine  [email or web address]  [facility]  [address] [place], [place] [address] [phone] or [phone] [facility] 24/7 FOR ANYONE [address] [place], [place]  [phone] Fax: [phone] [email or web address] *Interpreters available *Accepts all insurance and uninsured for Urgent Care needs *Accepts Medicaid and uninsured for outpatient treatment (below)    ONLY FOR [facility]  Below:   Outpatient New Patient Assessment/Therapy Walk-ins:        Monday -Thursday 8am until slots are full.        Every Friday 1pm-4pm  (first come, first served)                   New Patient Psychiatry/Medication Management        Monday-Friday 8am-11am (first come, first served)              For all walk-ins we ask that you arrive by  7:15am, because patients will be seen in the order of arrival.

## 2023-02-27 NOTE — BH Specialist Note (Signed)
Pt did not arrive to video visit and did not answer the phone; Left HIPPA-compliant message to call back Erika Klein from [facility] at  [phone] (Jaquese Irving's office).  ?; left MyChart message for patient.  ? ?

## 2023-02-28 ENCOUNTER — Ambulatory Visit: Payer: Medicaid Other

## 2023-03-12 ENCOUNTER — Ambulatory Visit: Payer: Medicaid Other | Admitting: Clinical

## 2023-03-12 DIAGNOSIS — Z91199 Patient's noncompliance with other medical treatment and regimen due to unspecified reason: Secondary | ICD-10-CM

## 2023-04-03 ENCOUNTER — Ambulatory Visit: Payer: Medicaid Other | Admitting: Neurology

## 2023-04-03 ENCOUNTER — Encounter: Payer: Self-pay | Admitting: Neurology

## 2023-04-03 VITALS — BP 122/85 | HR 61 | Ht 63.0 in | Wt 257.5 lb

## 2023-04-03 DIAGNOSIS — G932 Benign intracranial hypertension: Secondary | ICD-10-CM

## 2023-04-03 DIAGNOSIS — G8929 Other chronic pain: Secondary | ICD-10-CM

## 2023-04-03 DIAGNOSIS — R519 Headache, unspecified: Secondary | ICD-10-CM

## 2023-04-03 MED ORDER — SUMATRIPTAN SUCCINATE 50 MG PO TABS: 50.0000 mg | ORAL_TABLET | ORAL | 6 refills | Status: DC | PRN

## 2023-04-03 MED ORDER — TOPIRAMATE 50 MG PO TABS: 50.0000 mg | ORAL_TABLET | Freq: Every evening | ORAL | 6 refills | Status: DC

## 2023-04-03 NOTE — Progress Notes (Signed)
[facility]  PATIENT: Erika Klein DOB: [birth year]  REQUESTING CLINICIAN: Derwood Kaplan, MD HISTORY FROM: Patient  REASON FOR VISIT: Chronic headaches    HISTORICAL  CHIEF COMPLAINT:  Chief Complaint  Patient presents with   New Patient (Initial Visit)    Rm13, alone HA: daily worse at times, unaware of triggers     HISTORY OF PRESENT ILLNESS:  This is a 27 year old woman past medical history of obesity, anxiety/depression, asthma, headaches who is presenting for management of the headaches.  Patient reports having a history of headaches since her teenage years but lately these headaches have been getting worse.  Currently she is having headaches every day, and episodes sometimes can last up to 2 days.  Headaches are diffuse but she will also get a sharp pain on the back of her head radiating to the top.  This type of headache is very severe and lasts up to 10 to 15 minutes.  She does have photophobia phonophobia and nausea with her headaches, she has never been on a preventive or abortive medication.  She reports a family history of migraine in her mother.  With a headache she was also complaining of blurry vision   Headache History and Characteristics: Onset: Since teenager Location: Diffuse pain but also will get sharp pain in the back of head  Quality:  pressure pounding pain Intensity: 5-10/10.  Duration: UP to 3 days  Migrainous Features: Photophobia, phonophobia, nausea.  Aura: No  History of brain injury or tumor: No  Family history: Mother  Motion sickness: no Cardiac history: no  OTC: Excedrin, tylenol Caffeine: No Sleep: sometimes headache wakes me up from sleep  Mood/ Stress: Manageable   Prior prophylaxis: Propranolol: No  Verapamil:No TCA: No Topamax: No Depakote: No Effexor: No Cymbalta: No Neurontin:No  Prior abortives: Triptan: No Anti-emetic: No Steroids: No Ergotamine suppository: No    OTHER MEDICAL  CONDITIONS: Asthma, Obesity, Headaches, Anxiety/Depression   REVIEW OF SYSTEMS: Full 14 system review of systems performed and negative with exception of: As noted in the HPI  ALLERGIES: No Known Allergies  HOME MEDICATIONS: Outpatient Medications Prior to Visit  Medication Sig Dispense Refill   aspirin-acetaminophen-caffeine (EXCEDRIN MIGRAINE) 250-250-65 MG tablet Take 2 tablets by mouth every 8 (eight) hours as needed for headache.     acetaminophen (TYLENOL) 500 MG tablet Take 1 tablet (500 mg total) by mouth every 6 (six) hours as needed. 30 tablet 0   albuterol (VENTOLIN HFA) 108 (90 Base) MCG/ACT inhaler Inhale 2 puffs into the lungs every 6 (six) hours as needed for wheezing or shortness of breath. (Patient not taking: Reported on 08/02/2022) 8 g 2   ibuprofen (ADVIL) 600 MG tablet Take 1 tablet (600 mg total) by mouth 3 (three) times daily. 15 tablet 0   metroNIDAZOLE (FLAGYL) 500 MG tablet Take 1 tablet (500 mg total) by mouth 2 (two) times daily. 14 tablet 0   Norethindrone Acetate-Ethinyl Estrad-FE (LOESTRIN 24 FE) 1-20 MG-MCG(24) tablet Take 1 tablet by mouth daily. 28 tablet 11   No facility-administered medications prior to visit.    PAST MEDICAL HISTORY: Past Medical History:  Diagnosis Date   Anxiety    Asthma    Depression    Headache     PAST SURGICAL HISTORY: Past Surgical History:  Procedure Laterality Date   anxiety     NO PAST SURGERIES      FAMILY HISTORY: Family History  Problem Relation Age of Onset   Schizophrenia Mother  Bipolar disorder Mother    Anxiety disorder Mother    Cancer Mother    Cancer - Cervical Mother    Anxiety disorder Father    Asthma Father    Hypertension Maternal Aunt    Hypertension Maternal Grandmother    Stroke Paternal Grandmother    Hypertension Paternal Grandmother    Diabetes Neg Hx    Heart disease Neg Hx     SOCIAL HISTORY: Social History   Socioeconomic History   Marital status: Single    Spouse  name: Not on file   Number of children: 3   Years of education: Not on file   Highest education level: Not on file  Occupational History   Not on file  Tobacco Use   Smoking status: Never   Smokeless tobacco: Never  Vaping Use   Vaping Use: Never used  Substance and Sexual Activity   Alcohol use: Not Currently   Drug use: Never   Sexual activity: Yes    Birth control/protection: None, Condom  Other Topics Concern   Not on file  Social History Narrative   Not on file   Social Determinants of Health   Financial Resource Strain: Not on file  Food Insecurity: Food Insecurity Present (08/01/2022)   Hunger Vital Sign    Worried About Running Out of Food in the Last Year: Often true    Ran Out of Food in the Last Year: Often true  Transportation Needs: No Transportation Needs (08/01/2022)   PRAPARE - Administrator, Civil Service (Medical): No    Lack of Transportation (Non-Medical): No  Physical Activity: Not on file  Stress: Not on file  Social Connections: Not on file  Intimate Partner Violence: Not At Risk (08/01/2022)   Humiliation, Afraid, Rape, and Kick questionnaire    Fear of Current or Ex-Partner: No    Emotionally Abused: No    Physically Abused: No    Sexually Abused: No    PHYSICAL EXAM  GENERAL EXAM/CONSTITUTIONAL: Vitals:  Vitals:   04/03/23 0830  BP: 122/85  Pulse: 61  Weight: 257 lb 8 oz (116.8 kg)  Height: 5\' 3"  (1.6 m)   Body mass index is 45.61 kg/m. Wt Readings from Last 3 Encounters:  04/03/23 257 lb 8 oz (116.8 kg)  02/09/23 260 lb (117.9 kg)  11/26/22 253 lb 11.2 oz (115.1 kg)   Patient is in no distress; well developed, nourished and groomed; neck is supple  MUSCULOSKELETAL: Gait, strength, tone, movements noted in Neurologic exam below  NEUROLOGIC: MENTAL STATUS:      No data to display         awake, alert, oriented to person, place and time recent and remote memory intact normal attention and  concentration language fluent, comprehension intact, naming intact fund of knowledge appropriate  CRANIAL NERVE:  2nd - no papilledema or hemorrhages on fundoscopic exam 2nd, 3rd, 4th, 6th - pupils equal and reactive to light, visual fields full to confrontation, extraocular muscles intact, no nystagmus 5th - facial sensation symmetric 7th - facial strength symmetric 8th - hearing intact 9th - palate elevates symmetrically, uvula midline 11th - shoulder shrug symmetric 12th - tongue protrusion midline  MOTOR:  normal bulk and tone, full strength in the BUE, BLE  SENSORY:  normal and symmetric to light touch, pinprick  COORDINATION:  finger-nose-finger, fine finger movements normal  GAIT/STATION:  normal     DIAGNOSTIC DATA (LABS, IMAGING, TESTING) - I reviewed patient records, labs, notes, testing and imaging  myself where available.  Lab Results  Component Value Date   WBC 4.6 08/01/2022   HGB 10.8 (L) 08/01/2022   HCT 32.8 (L) 08/01/2022   MCV 94.3 08/01/2022   PLT 136 (L) 08/01/2022      Component Value Date/Time   NA 135 08/16/2021 1312   K 3.5 08/16/2021 1312   CL 104 08/16/2021 1312   CO2 23 08/16/2021 1312   GLUCOSE 70 08/16/2021 1312   BUN 10 08/16/2021 1312   CREATININE 0.79 08/16/2021 1312   CALCIUM 8.9 08/16/2021 1312   PROT 6.0 (L) 08/16/2021 1312   ALBUMIN 2.7 (L) 08/16/2021 1312   AST 20 08/16/2021 1312   ALT 14 08/16/2021 1312   ALKPHOS 148 (H) 08/16/2021 1312   BILITOT 0.7 08/16/2021 1312   GFRNONAA >60 08/16/2021 1312   GFRAA >60 05/11/2020 0306   No results found for: "CHOL", "HDL", "LDLCALC", "LDLDIRECT", "TRIG", "CHOLHDL" Lab Results  Component Value Date   HGBA1C 4.3 (L) 03/06/2022   No results found for: "VITAMINB12" No results found for: "TSH"    ASSESSMENT AND PLAN  27 y.o. year old female with history of headaches, obesity, anxiety/depression, asthma who is presenting for management of her headaches.  She reports daily  headaches with blurry vision, photophobia, photophobia and nausea.  There is also report of neck pain that shoot on top of head.  Patient headaches are likely migraine headaches versus idiopathic intracranial hypertension.  I will start by obtaining MRI brain to assist with the diagnosis.  I will start her on preventive medication, topiramate and will also give her sumatriptan.  If the MRI brain consistent with idiopathic intracranial hypertension, we will get a lumbar puncture for opening pressure and likely increase her topiramate or switch her to Diamox.  If MRI brain negative then we will treat her as migraine headaches.  This was discussed with the patient and she is comfortable with plan.  I will see her in 6 months for follow-up or sooner if worse    1. Chronic nonintractable headache, unspecified headache type   2. Benign intracranial hypertension     Patient Instructions  MRI brain with and without contrast for further calcification Start Topamax nightly as preventive medication Start with sumatriptan as needed for severe headaches If MRI brain showing evidence of idiopathic intracranial hypertension, we will proceed with LP Follow-up in 6 months or sooner if worse   Orders Placed This Encounter  Procedures   MR BRAIN W WO CONTRAST    Meds ordered this encounter  Medications   topiramate (TOPAMAX) 50 MG tablet    Sig: Take 1 tablet (50 mg total) by mouth at bedtime.    Dispense:  30 tablet    Refill:  6   SUMAtriptan (IMITREX) 50 MG tablet    Sig: Take 1 tablet (50 mg total) by mouth every 2 (two) hours as needed for migraine. May repeat in 2 hours if headache persists or recurs.    Dispense:  10 tablet    Refill:  6    Return in about 6 months (around 10/04/2023).    Windell Norfolk, MD 04/03/2023, 12:30 PM  [facility] [address], [address] [place], [place] [address] [phone]

## 2023-04-03 NOTE — Patient Instructions (Signed)
MRI brain with and without contrast for further calcification Start Topamax nightly as preventive medication Start with sumatriptan as needed for severe headaches If MRI brain showing evidence of idiopathic intracranial hypertension, we will proceed with LP Follow-up in 6 months or sooner if worse

## 2023-04-04 ENCOUNTER — Telehealth: Payer: Self-pay | Admitting: Neurology

## 2023-04-04 NOTE — Telephone Encounter (Signed)
[employer] medicaid Berkley Harvey: [id number] exp. 04/03/2023-06/02/2023 sent to GI [phone]

## 2023-04-05 ENCOUNTER — Ambulatory Visit: Payer: Medicaid Other

## 2023-04-11 ENCOUNTER — Ambulatory Visit (HOSPITAL_COMMUNITY): Payer: Medicaid Other | Admitting: Student in an Organized Health Care Education/Training Program

## 2023-04-19 ENCOUNTER — Ambulatory Visit: Payer: Medicaid Other

## 2023-05-08 NOTE — Progress Notes (Deleted)
Psychiatric Initial Adult Assessment  Patient Identification: Erika Klein MRN:  [id number] Date of Evaluation:  05/13/2023 Referral Source: Merian Capron, MD  Assessment:  Erika Klein is a 27 y.o. female with a history of asthma, benign intracranial hypertension *** who presents to [facility] via video conferencing for initial evaluation of ***.  Patient reports ***  Plan:  # *** Past medication trials:  Status of problem: *** Interventions: -- ***  # *** Past medication trials:  Status of problem: *** Interventions: -- ***  # *** Past medication trials:  Status of problem: *** Interventions: -- ***  Patient was given contact information for behavioral health clinic and was instructed to call 911 for emergencies.   Subjective:  Chief Complaint: No chief complaint on file.   History of Present Illness:  ***  Chart review: referred by PCP in April 2024 for GAD.      Past Psychiatric History:  Diagnoses: *** Medication trials: ***Buspar for 3 days in 2019 (stopped d/t pregnancy) Previous psychiatrist/therapist: *** Hospitalizations: *** Suicide attempts: *** SIB: *** Hx of violence towards others: *** Current access to guns: *** Hx of trauma/abuse: ***  Previous Psychotropic Medications: {YES/NO:21197}  Substance Abuse History in the last 12 months:  {yes no:314532}  Past Medical History:  Past Medical History:  Diagnosis Date   Anxiety    Asthma    Depression    Headache     Past Surgical History:  Procedure Laterality Date   anxiety     NO PAST SURGERIES      Family Psychiatric History: ***  Family History:  Family History  Problem Relation Age of Onset   Schizophrenia Mother    Bipolar disorder Mother    Anxiety disorder Mother    Cancer Mother    Cancer - Cervical Mother    Anxiety disorder Father    Asthma Father    Hypertension Maternal Aunt    Hypertension Maternal Grandmother    Stroke Paternal  Grandmother    Hypertension Paternal Grandmother    Diabetes Neg Hx    Heart disease Neg Hx     Social History:   Academic/Vocational: ***  Social History   Socioeconomic History   Marital status: Single    Spouse name: Not on file   Number of children: 3   Years of education: Not on file   Highest education level: Not on file  Occupational History   Not on file  Tobacco Use   Smoking status: Never   Smokeless tobacco: Never  Vaping Use   Vaping Use: Never used  Substance and Sexual Activity   Alcohol use: Not Currently   Drug use: Never   Sexual activity: Yes    Birth control/protection: None, Condom  Other Topics Concern   Not on file  Social History Narrative   Not on file   Social Determinants of Health   Financial Resource Strain: Not on file  Food Insecurity: Food Insecurity Present (08/01/2022)   Hunger Vital Sign    Worried About Running Out of Food in the Last Year: Often true    Ran Out of Food in the Last Year: Often true  Transportation Needs: No Transportation Needs (08/01/2022)   PRAPARE - Administrator, Civil Service (Medical): No    Lack of Transportation (Non-Medical): No  Physical Activity: Not on file  Stress: Not on file  Social Connections: Not on file    Additional Social History: updated  Allergies:  No Known  Allergies  Current Medications: Current Outpatient Medications  Medication Sig Dispense Refill   aspirin-acetaminophen-caffeine (EXCEDRIN MIGRAINE) 250-250-65 MG tablet Take 2 tablets by mouth every 8 (eight) hours as needed for headache.     SUMAtriptan (IMITREX) 50 MG tablet Take 1 tablet (50 mg total) by mouth every 2 (two) hours as needed for migraine. May repeat in 2 hours if headache persists or recurs. 10 tablet 6   topiramate (TOPAMAX) 50 MG tablet Take 1 tablet (50 mg total) by mouth at bedtime. 30 tablet 6   No current facility-administered medications for this visit.    ROS: Review of  Systems  Objective:  Psychiatric Specialty Exam: unknown if currently breastfeeding.There is no height or weight on file to calculate BMI.  General Appearance: {Appearance:22683}  Eye Contact:  {BHH EYE CONTACT:22684}  Speech:  {Speech:22685}  Volume:  {Volume (PAA):22686}  Mood:  {BHH MOOD:22306}  Affect:  {Affect (PAA):22687}  Thought Content: {Thought Content:22690}   Suicidal Thoughts:  {ST/HT (PAA):22692}  Homicidal Thoughts:  {ST/HT (PAA):22692}  Thought Process:  {Thought Process (PAA):22688}  Orientation:  {BHH ORIENTATION (PAA):22689}    Memory: Grossly intact ***  Judgment:  {Judgement (PAA):22694}  Insight:  {Insight (PAA):22695}  Concentration:  {Concentration:21399}  Recall:  not formally assessed ***  Fund of Knowledge: {BHH GOOD/FAIR/POOR:22877}  Language: {BHH GOOD/FAIR/POOR:22877}  Psychomotor Activity:  {Psychomotor (PAA):22696}  Akathisia:  {BHH YES OR NO:22294}  AIMS (if indicated): {Desc; done/not:10129}  Assets:  {Assets (PAA):22698}  ADL's:  {BHH JWJ'X:91478}  Cognition: {chl bhh cognition:304700322}  Sleep:  {BHH GOOD/FAIR/POOR:22877}   PE: General: sits comfortably in view of camera; no acute distress *** Pulm: no increased work of breathing on room air *** MSK: all extremity movements appear intact *** Neuro: no focal neurological deficits observed *** Gait & Station: unable to assess by video ***   Metabolic Disorder Labs: Lab Results  Component Value Date   HGBA1C 4.3 (L) 03/06/2022   No results found for: "PROLACTIN" No results found for: "CHOL", "TRIG", "HDL", "CHOLHDL", "VLDL", "LDLCALC" No results found for: "TSH"  Therapeutic Level Labs: No results found for: "LITHIUM" No results found for: "CBMZ" No results found for: "VALPROATE"  Screenings:  GAD-7    Flowsheet Row Integrated Behavioral Health from 02/26/2023 in [facility] Office Visit from 11/26/2022 in [facility] [employer] [facility] Routine Prenatal from 07/23/2022 in [facility] Routine Prenatal from 07/16/2022 in [facility] [employer] [facility] Women Initial Prenatal from 03/06/2022 in [facility]  Total GAD-7 Score 6 5 0 2 1      PHQ2-9    Flowsheet Row Integrated Behavioral Health from 02/26/2023 in [facility] Office Visit from 11/26/2022 in [facility] [employer] [facility] for Women Routine Prenatal from 07/23/2022 in [facility] Routine Prenatal from 07/16/2022 in [facility] for Women Initial Prenatal from 03/06/2022 in [facility]  PHQ-2 Total Score 2 0 1 3 1   PHQ-9 Total Score 9 1 3 4 2       Flowsheet Row ED from 02/09/2023 in [facility] Emergency Department at [facility] Admission (Discharged) from 02/16/2022 in [facility] 1S Maternity Assessment Unit  Admission (Discharged) from 08/11/2021 in [facility] 1S Maternity Assessment Unit  C-SSRS RISK CATEGORY No Risk No Risk No Risk       Collaboration of Care: Collaboration of Care: {BH OP Collaboration of Care:21014065}  Patient/Guardian was advised Release of Information must be obtained prior to any record release in order to collaborate their care with an outside provider. Patient/Guardian was advised if they have not already done so to contact the registration department to sign all necessary forms in order for [place] to release information regarding their care.   Consent: Patient/Guardian gives verbal consent for treatment and assignment of benefits for services provided during this visit. Patient/Guardian expressed understanding and agreed to proceed.   Televisit via video: I  connected with Erika Klein on 05/13/23 at  1:00 PM EDT by a video enabled telemedicine application and verified that I am speaking with the correct person using two identifiers.  Location: Patient: *** Provider: remote office in [place]   I discussed the limitations of evaluation and management by telemedicine and the availability of in person appointments. The patient expressed understanding and agreed to proceed.  I discussed the assessment and treatment plan with the patient. The patient was provided an opportunity to ask questions and all were answered. The patient agreed with the plan and demonstrated an understanding of the instructions.   The patient was advised to call back or seek an in-person evaluation if the symptoms worsen or if the condition fails to improve as anticipated.  I provided *** minutes of non-face-to-face time during this encounter.  Matei Magnone A Tymara Saur 7/8/20241:02 PM

## 2023-05-13 ENCOUNTER — Encounter (HOSPITAL_COMMUNITY): Payer: Self-pay

## 2023-05-13 ENCOUNTER — Ambulatory Visit (HOSPITAL_COMMUNITY): Payer: Medicaid Other | Admitting: Psychiatry

## 2023-05-16 ENCOUNTER — Ambulatory Visit: Payer: Medicaid Other

## 2023-05-21 ENCOUNTER — Ambulatory Visit (INDEPENDENT_AMBULATORY_CARE_PROVIDER_SITE_OTHER): Payer: Medicaid Other

## 2023-05-21 ENCOUNTER — Other Ambulatory Visit (HOSPITAL_COMMUNITY)
Admission: RE | Admit: 2023-05-21 | Discharge: 2023-05-21 | Disposition: A | Discharge status: Home / Self Care | Payer: Medicaid Other | Source: Ambulatory Visit | Attending: Family Medicine | Admitting: Family Medicine

## 2023-05-21 ENCOUNTER — Other Ambulatory Visit: Payer: Self-pay

## 2023-05-21 VITALS — BP 124/48 | HR 63 | Ht 63.0 in | Wt 240.9 lb

## 2023-05-21 DIAGNOSIS — Z202 Contact with and (suspected) exposure to infections with a predominantly sexual mode of transmission: Secondary | ICD-10-CM

## 2023-05-21 NOTE — Progress Notes (Signed)
Erika Klein is here with concern of fishy smell from vaginal area, but no vaginal itching. These symptoms have been present for about a week. Patient reports she has tried nothing to resolve symptoms.  Pertinent history: recurrent BV per patient; hx of gonorrhea, trich, chlamydia (last occurrence about 2 years ago)  Plan of care: Self swab instructions given and specimen obtained. Patient also requested STD bloodwork panel; patient has had same partner for 4 years, but would like to check. Explained patient will be contacted with any abnormal results. Patient is not due for annual exam.  Meryl Crutch, RN 05/21/2023  1:59 PM

## 2023-05-22 ENCOUNTER — Other Ambulatory Visit: Payer: Self-pay | Admitting: Family Medicine

## 2023-05-22 LAB — CERVICOVAGINAL ANCILLARY ONLY
Bacterial Vaginitis (gardnerella): POSITIVE — AB
Candida Glabrata: NEGATIVE
Candida Vaginitis: POSITIVE — AB
Chlamydia: NEGATIVE
Comment: NEGATIVE
Comment: NEGATIVE
Comment: NEGATIVE
Comment: NEGATIVE
Comment: NEGATIVE
Comment: NORMAL
Neisseria Gonorrhea: NEGATIVE
Trichomonas: NEGATIVE

## 2023-05-22 LAB — RPR: RPR Ser Ql: NONREACTIVE

## 2023-05-22 LAB — HEPATITIS B SURFACE ANTIGEN: Hepatitis B Surface Ag: NEGATIVE

## 2023-05-22 LAB — HEPATITIS C ANTIBODY: Hep C Virus Ab: NONREACTIVE

## 2023-05-22 LAB — HIV ANTIBODY (ROUTINE TESTING W REFLEX): HIV Screen 4th Generation wRfx: NONREACTIVE

## 2023-05-22 MED ORDER — METRONIDAZOLE 500 MG PO TABS: 500.0000 mg | ORAL_TABLET | Freq: Two times a day (BID) | ORAL | 0 refills | Status: AC

## 2023-05-22 MED ORDER — FLUCONAZOLE 150 MG PO TABS: 150.0000 mg | ORAL_TABLET | Freq: Once | ORAL | 0 refills | Status: AC

## 2023-05-30 ENCOUNTER — Telehealth: Payer: Self-pay | Admitting: *Deleted

## 2023-05-30 NOTE — Telephone Encounter (Signed)
Erika Klein left a message this am that she wants to see if she can get her medicine sent to the right pharmacy. I called Fronie and she was referring to the Flagyl and Diflucan sent 05/22/23. I explained since we have already sent in the prescriptions she can call the pharmacy she would like them sent to and request a transfer of her prescriptions and she needs to tell them the names and date it was sent. She voices understanding. [name]

## 2023-06-09 ENCOUNTER — Telehealth: Payer: Medicaid Other | Admitting: Nurse Practitioner

## 2023-06-09 DIAGNOSIS — B3731 Acute candidiasis of vulva and vagina: Secondary | ICD-10-CM

## 2023-06-09 DIAGNOSIS — B9689 Other specified bacterial agents as the cause of diseases classified elsewhere: Secondary | ICD-10-CM

## 2023-06-09 DIAGNOSIS — N760 Acute vaginitis: Secondary | ICD-10-CM

## 2023-06-09 MED ORDER — FLUCONAZOLE 150 MG PO TABS: 150.0000 mg | ORAL_TABLET | Freq: Once | ORAL | 0 refills | Status: AC

## 2023-06-09 MED ORDER — METRONIDAZOLE 500 MG PO TABS
500.0000 mg | ORAL_TABLET | Freq: Two times a day (BID) | ORAL | 0 refills | Status: AC
Start: 2023-06-09 — End: 2023-06-16

## 2023-06-09 NOTE — Progress Notes (Signed)

## 2023-08-13 ENCOUNTER — Ambulatory Visit: Payer: Medicaid Other

## 2023-08-13 NOTE — Progress Notes (Deleted)
Erika Klein is here with concern of ***. These symptoms have been present for ***. Patient reports she has {trialed:28378}  Pertinent history:  Plan of care:   Self swab instructions given and specimen obtained. Explained patient will be contacted with any abnormal results. Patient is not due for annual exam.  Meryl Crutch, RN 08/13/2023  8:28 AM

## 2023-08-15 ENCOUNTER — Ambulatory Visit: Payer: Medicaid Other

## 2023-09-23 ENCOUNTER — Telehealth: Payer: Medicaid Other | Admitting: Physician Assistant

## 2023-09-23 DIAGNOSIS — O209 Hemorrhage in early pregnancy, unspecified: Secondary | ICD-10-CM

## 2023-09-23 NOTE — Patient Instructions (Signed)
  Erika Klein, thank you for joining Erika Loveless, PA-C for today's virtual visit.  While this provider is not your primary care provider (PCP), if your PCP is located in our provider database this encounter information will be shared with them immediately following your visit.   A [facility] MyChart account gives you access to today's visit and all your visits, tests, and labs performed at [facility] " click here if you don't have a [facility] MyChart account or go to [email or web address]  Consent: (Patient) Erika Klein provided verbal consent for this virtual visit at the beginning of the encounter.  Current Medications:  Current Outpatient Medications:    aspirin-acetaminophen-caffeine (EXCEDRIN MIGRAINE) 250-250-65 MG tablet, Take 2 tablets by mouth every 8 (eight) hours as needed for headache. (Patient not taking: Reported on 05/21/2023), Disp: , Rfl:    SUMAtriptan (IMITREX) 50 MG tablet, Take 1 tablet (50 mg total) by mouth every 2 (two) hours as needed for migraine. May repeat in 2 hours if headache persists or recurs. (Patient not taking: Reported on 05/21/2023), Disp: 10 tablet, Rfl: 6   topiramate (TOPAMAX) 50 MG tablet, Take 1 tablet (50 mg total) by mouth at bedtime. (Patient not taking: Reported on 05/21/2023), Disp: 30 tablet, Rfl: 6   Medications ordered in this encounter:  No orders of the defined types were placed in this encounter.    *If you need refills on other medications prior to your next appointment, please contact your pharmacy*  Follow-Up: Call back or seek an in-person evaluation if the symptoms worsen or if the condition fails to improve as anticipated.  [facility] [phone]  Other Instructions Seek further in person evaluation   If you have been instructed to have an in-person evaluation today at a local Urgent Care facility, please use the link below. It will take you to a list of all of our available [facility], including address, phone number and hours of operation. Please do not delay care.  [facility]  If you or a family member do not have a primary care provider, use the link below to schedule a visit and establish care. When you choose a [facility] primary care physician or advanced practice provider, you gain a long-term partner in health. Find a Primary Care Provider  Learn more about [facility]'s in-office and virtual care options:  - Get Care Now

## 2023-09-23 NOTE — Progress Notes (Signed)
Virtual Visit Consent   Erika Klein, you are scheduled for a virtual visit with a [facility] provider today. Just as with appointments in the office, your consent must be obtained to participate. Your consent will be active for this visit and any virtual visit you may have with one of our providers in the next 365 days. If you have a MyChart account, a copy of this consent can be sent to you electronically.  As this is a virtual visit, video technology does not allow for your provider to perform a traditional examination. This may limit your provider's ability to fully assess your condition. If your provider identifies any concerns that need to be evaluated in person or the need to arrange testing (such as labs, EKG, etc.), we will make arrangements to do so. Although advances in technology are sophisticated, we cannot ensure that it will always work on either your end or our end. If the connection with a video visit is poor, the visit may have to be switched to a telephone visit. With either a video or telephone visit, we are not always able to ensure that we have a secure connection.  By engaging in this virtual visit, you consent to the provision of healthcare and authorize for your insurance to be billed (if applicable) for the services provided during this visit. Depending on your insurance coverage, you may receive a charge related to this service.  I need to obtain your verbal consent now. Are you willing to proceed with your visit today? Erika Klein has provided verbal consent on 09/23/2023 for a virtual visit (video or telephone). Erika Loveless, PA-C  Date: 09/23/2023 8:34 AM  Virtual Visit via Video Note   I, Erika Klein, connected with  Erika Klein  ([id number], [birth year]) on 09/23/23 at  8:30 AM EST by a video-enabled telemedicine application and verified that I am speaking with the correct person using two identifiers.  Location: Patient: Virtual Visit  Location Patient: Home Provider: Virtual Visit Location Provider: Home Office   I discussed the limitations of evaluation and management by telemedicine and the availability of in person appointments. The patient expressed understanding and agreed to proceed.    History of Present Illness: Erika Klein is a 27 y.o. who identifies as a female who was assigned female at birth, and is being seen today for bleeding in early pregnancy. Reports was having some cramping and took 2 at home pregnancy test about 2 weeks ago that were both positive. She then started having some light spotting late last week. That subsided but then this morning had more heavy spotting. She has still been having cramping as well.     Problems:  Patient Active Problem List   Diagnosis Date Noted   Maternal obesity affecting pregnancy, antepartum 08/01/2022   LGSIL on Pap smear of cervix 03/06/2022   Obesity affecting pregnancy 03/06/2022   Supervision of other normal pregnancy, antepartum 02/07/2022   Elevated blood pressure reading without diagnosis of hypertension 08/18/2021   SVD (spontaneous vaginal delivery) 08/16/2021   Anxiety    Depression     Allergies: No Known Allergies Medications:  Current Outpatient Medications:    aspirin-acetaminophen-caffeine (EXCEDRIN MIGRAINE) 250-250-65 MG tablet, Take 2 tablets by mouth every 8 (eight) hours as needed for headache. (Patient not taking: Reported on 05/21/2023), Disp: , Rfl:    SUMAtriptan (IMITREX) 50 MG tablet, Take 1 tablet (50 mg total) by mouth every 2 (two) hours as needed for migraine. May repeat in 2  hours if headache persists or recurs. (Patient not taking: Reported on 05/21/2023), Disp: 10 tablet, Rfl: 6   topiramate (TOPAMAX) 50 MG tablet, Take 1 tablet (50 mg total) by mouth at bedtime. (Patient not taking: Reported on 05/21/2023), Disp: 30 tablet, Rfl: 6  Observations/Objective: Patient is well-developed, well-nourished in no acute distress.  Resting  comfortably at home.  Head is normocephalic, atraumatic.  No labored breathing.  Speech is clear and coherent with logical content.  Patient is alert and oriented at baseline.    Assessment and Plan: 1. Bleeding in early pregnancy  - Advised to seek in person evaluation due to early stage pregnancy with bleeding and cramping.  Follow Up Instructions: I discussed the assessment and treatment plan with the patient. The patient was provided an opportunity to ask questions and all were answered. The patient agreed with the plan and demonstrated an understanding of the instructions.  A copy of instructions were sent to the patient via MyChart unless otherwise noted below.    The patient was advised to call back or seek an in-person evaluation if the symptoms worsen or if the condition fails to improve as anticipated.    Erika Loveless, PA-C

## 2023-10-08 ENCOUNTER — Encounter: Payer: Self-pay | Admitting: Neurology

## 2023-10-08 ENCOUNTER — Ambulatory Visit: Payer: Medicaid Other | Admitting: Neurology

## 2023-10-09 ENCOUNTER — Ambulatory Visit: Payer: Medicaid Other

## 2023-10-09 DIAGNOSIS — Z3201 Encounter for pregnancy test, result positive: Secondary | ICD-10-CM

## 2023-10-09 LAB — POCT URINE PREGNANCY: Preg Test, Ur: POSITIVE — AB

## 2023-10-09 MED ORDER — PREPLUS 27-1 MG PO TABS
1.0000 | ORAL_TABLET | Freq: Every day | ORAL | 11 refills | Status: DC
Start: 2023-10-09 — End: 2024-02-13

## 2023-10-09 NOTE — Progress Notes (Signed)
Possible Pregnancy  Here today for pregnancy confirmation--patient left urine sample to be tested and called with results; called patient at number listed in chart and verified using full name and DOB. UPT in office today is positive. Pt reports first positive home UPT on 09/16/23. Reviewed dating with patient:   LMP: 07/13/23 (exact)--periods regular prior to pregnancy EDD: 04/18/24 12w 4d today  Patient is now a G5P3; history of elevated BP readings without diagnosis of HTN; no other complications during pregnancy or deliveries. No other comorbidities outside of pregnancy. Patient denies vaginal bleeding and pain today.  OB history reviewed. Reviewed medications and allergies with patient; list of medications safe to take during pregnancy given.  Recommended pt begin prenatal vitamin and schedule prenatal care.  Message sent to front desk to schedule for OB intake appointment and new OB appointment.   Meryl Crutch, RN 10/09/2023  5:13 PM

## 2023-10-12 ENCOUNTER — Telehealth: Payer: Medicaid Other | Admitting: Nurse Practitioner

## 2023-10-12 DIAGNOSIS — N760 Acute vaginitis: Secondary | ICD-10-CM | POA: Diagnosis not present

## 2023-10-12 DIAGNOSIS — B9689 Other specified bacterial agents as the cause of diseases classified elsewhere: Secondary | ICD-10-CM

## 2023-10-12 MED ORDER — METRONIDAZOLE 500 MG PO TABS
500.0000 mg | ORAL_TABLET | Freq: Two times a day (BID) | ORAL | 0 refills | Status: AC
Start: 2023-10-12 — End: 2023-10-19

## 2023-10-12 NOTE — Progress Notes (Signed)

## 2023-10-12 NOTE — Progress Notes (Signed)
I have spent 5 minutes in review of e-visit questionnaire, review and updating patient chart, medical decision making and response to patient.  ° °Jerrell Mangel W Secilia Apps, NP ° °  °

## 2023-10-14 ENCOUNTER — Ambulatory Visit: Payer: Medicaid Other | Admitting: Neurology

## 2023-10-21 ENCOUNTER — Encounter: Payer: Self-pay | Admitting: Physician Assistant

## 2023-10-21 ENCOUNTER — Telehealth: Payer: Medicaid Other | Admitting: Nurse Practitioner

## 2023-10-21 DIAGNOSIS — O2690 Pregnancy related conditions, unspecified, unspecified trimester: Secondary | ICD-10-CM

## 2023-10-21 NOTE — Progress Notes (Signed)
Erika Klein,  Thank you for submitting an e-visit. You would need an in person assessment to evaluate the area for the cause.   It would be best to see your OBGYN, as I see you are also looking for ultrasound assistance for a recent pregnancy.   Please call your OBGYN office to schedule an appointment  Thank you Erika Klein

## 2023-10-23 ENCOUNTER — Telehealth (INDEPENDENT_AMBULATORY_CARE_PROVIDER_SITE_OTHER): Payer: Medicaid Other

## 2023-10-23 DIAGNOSIS — Z3482 Encounter for supervision of other normal pregnancy, second trimester: Secondary | ICD-10-CM

## 2023-10-23 DIAGNOSIS — Z3A14 14 weeks gestation of pregnancy: Secondary | ICD-10-CM | POA: Diagnosis not present

## 2023-10-23 DIAGNOSIS — Z3480 Encounter for supervision of other normal pregnancy, unspecified trimester: Secondary | ICD-10-CM | POA: Insufficient documentation

## 2023-10-23 NOTE — Progress Notes (Signed)
New OB Intake  I connected with Alysah Gauna  on 10/23/23 at  1:15 PM EST by MyChart Video Visit and verified that I am speaking with the correct person using two identifiers. Nurse is located at [facility] and pt is located at home.  I discussed the limitations, risks, security and privacy concerns of performing an evaluation and management service by telephone and the availability of in person appointments. I also discussed with the patient that there may be a patient responsible charge related to this service. The patient expressed understanding and agreed to proceed.  I explained I am completing New OB Intake today. We discussed EDD of Not found.. Pt is [id number]. I reviewed her allergies, medications and Medical/Surgical/OB history.    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. Discussed the nature of our practice with multiple providers including residents and students. Due to the size of the practice, the delivering provider may not be the same as those providing prenatal care.   Patient is not interested in water birth. Offered upcoming OB visit with CNM to discuss further.  MyChart/Babyscripts MyChart access verified. I explained pt will have some visits in office and some virtually. Babyscripts instructions given and order placed. Patient verifies receipt of registration text/e-mail. Account successfully created and app downloaded. If patient is a candidate for Optimized scheduling, add to sticky note.   Blood Pressure Cuff/Weight Scale Pt has  own BP Cuff, Explained after first prenatal appt pt will check weekly and document in Babyscripts. Patient does have weight scale.  Anatomy US Explained first scheduled [place] will be around 19 weeks. Anatomy US scheduled for 11/25/23 at 0215p.  Is patient a CenteringPregnancy candidate?  Accepted   If accepted,    Is patient a Mom+Baby Combined Care candidate?  Not a candidate   If accepted, confirm patient does not intend to move from the area for at least 12 months, then notify Mom+Baby staff  Interested in [place]? If yes, send referral and doula dot phrase.   Is patient a candidate for Babyscripts Optimization? No, due to Centering   First visit review I reviewed new OB appt with patient. Explained pt will be seen by Dr. Debroah Loop at first visit. Discussed Avelina Laine genetic screening with patient. needs Panorama and Horizon.. Routine prenatal labs  needed at [facility] OB visit.    Last Pap Diagnosis  Date Value Ref Range Status  11/26/2022   Final   - Negative for intraepithelial lesion or malignancy (NILM)    Henrietta Dine, CMA 10/23/2023  2:16 PM

## 2023-10-29 ENCOUNTER — Inpatient Hospital Stay (HOSPITAL_COMMUNITY)
Admission: AD | Admit: 2023-10-29 | Discharge: 2023-10-29 | Disposition: A | Discharge status: Home / Self Care | Payer: Medicaid Other | Attending: Obstetrics and Gynecology | Admitting: Obstetrics and Gynecology

## 2023-10-29 ENCOUNTER — Inpatient Hospital Stay (HOSPITAL_COMMUNITY): Payer: Medicaid Other

## 2023-10-29 DIAGNOSIS — O209 Hemorrhage in early pregnancy, unspecified: Secondary | ICD-10-CM

## 2023-10-29 DIAGNOSIS — Z3A11 11 weeks gestation of pregnancy: Secondary | ICD-10-CM

## 2023-10-29 DIAGNOSIS — O208 Other hemorrhage in early pregnancy: Secondary | ICD-10-CM | POA: Diagnosis present

## 2023-10-29 DIAGNOSIS — Z3A15 15 weeks gestation of pregnancy: Secondary | ICD-10-CM | POA: Diagnosis not present

## 2023-10-29 DIAGNOSIS — R102 Pelvic and perineal pain: Secondary | ICD-10-CM | POA: Insufficient documentation

## 2023-10-29 DIAGNOSIS — O26892 Other specified pregnancy related conditions, second trimester: Secondary | ICD-10-CM | POA: Diagnosis not present

## 2023-10-29 LAB — URINALYSIS, ROUTINE W REFLEX MICROSCOPIC
Bacteria, UA: NONE SEEN
Bilirubin Urine: NEGATIVE
Glucose, UA: NEGATIVE mg/dL
Hgb urine dipstick: NEGATIVE
Ketones, ur: NEGATIVE mg/dL
Nitrite: NEGATIVE
Protein, ur: NEGATIVE mg/dL
Specific Gravity, Urine: 1.016 (ref 1.005–1.030)
pH: 5 (ref 5.0–8.0)

## 2023-10-29 NOTE — MAU Note (Signed)
..  Erika Klein is a 27 y.o. at [redacted]w[redacted]d here in MAU reporting: reddish brown blood when she wipes. No bleeding on her pad/underwear. Cramping that has been on and off for 3 days. Has not taken medication for the pain.   Pain score: 7/10 Vitals:   10/29/23 0350  BP: 127/67  Pulse: 79  Resp: 17  Temp: 98.2 F (36.8 C)  SpO2: 100%     VHQ:IONGEX to doppler in triage provider notified and formal ultrasound ordered.

## 2023-10-29 NOTE — MAU Provider Note (Signed)
Chief Complaint: Abdominal Pain and Vaginal Bleeding   Event Date/Time   First Provider Initiated Contact with Patient 10/29/23 0423        SUBJECTIVE HPI: Erika Klein is a 27 y.o. [id number] at [redacted]w[redacted]d by LMP who presents to maternity admissions reporting reddish bleeding,  has some cramping at times.  Has not had an ultrasound yet. . She denies vaginal itching/burning, urinary symptoms, h/a, dizziness, n/v, or fever/chills.     Vaginal Bleeding The patient's primary symptoms include pelvic pain (occasional cramps) and vaginal bleeding. The patient's pertinent negatives include no genital odor. This is a new problem. The current episode started in the past 7 days. The problem has been unchanged. Pertinent negatives include no back pain, chills, constipation, diarrhea or fever. The vaginal discharge was bloody. The vaginal bleeding is spotting. She has not been passing clots. She has not been passing tissue.   RN Note Dina Condrey is a 27 y.o. at [redacted]w[redacted]d here in MAU reporting: reddish brown blood when she wipes. No bleeding on her pad/underwear. Cramping that has been on and off for 3 days. Has not taken medication for the pain.   Pain score: 7/10   Past Medical History:  Diagnosis Date   Anxiety    Asthma    Depression    Headache    Past Surgical History:  Procedure Laterality Date   anxiety     NO PAST SURGERIES     Social History   Socioeconomic History   Marital status: Single    Spouse name: Not on file   Number of children: 3   Years of education: Not on file   Highest education level: Not on file  Occupational History   Not on file  Tobacco Use   Smoking status: Never   Smokeless tobacco: Never  Vaping Use   Vaping status: Never Used  Substance and Sexual Activity   Alcohol use: Not Currently   Drug use: Never   Sexual activity: Yes    Birth control/protection: None  Other Topics Concern   Not on file  Social History Narrative   Not on file   Social  Drivers of Health   Financial Resource Strain: Not on file  Food Insecurity: Food Insecurity Present (08/01/2022)   Hunger Vital Sign    Worried About Running Out of Food in the Last Year: Often true    Ran Out of Food in the Last Year: Often true  Transportation Needs: No Transportation Needs (08/01/2022)   PRAPARE - Administrator, Civil Service (Medical): No    Lack of Transportation (Non-Medical): No  Physical Activity: Not on file  Stress: Not on file  Social Connections: Not on file  Intimate Partner Violence: Not At Risk (08/01/2022)   Humiliation, Afraid, Rape, and Kick questionnaire    Fear of Current or Ex-Partner: No    Emotionally Abused: No    Physically Abused: No    Sexually Abused: No   No current facility-administered medications on file prior to encounter.   Current Outpatient Medications on File Prior to Encounter  Medication Sig Dispense Refill   aspirin-acetaminophen-caffeine (EXCEDRIN MIGRAINE) 250-250-65 MG tablet Take 2 tablets by mouth every 8 (eight) hours as needed for headache. (Patient not taking: Reported on 10/23/2023)     Prenatal MV & Min w/FA-DHA (PRENATAL GUMMIES) 0.18-25 MG CHEW Chew by mouth.     Prenatal Vit-Fe Fumarate-FA (PREPLUS) 27-1 MG TABS Take 1 tablet by mouth daily. 30 tablet 11   SUMAtriptan (  IMITREX) 50 MG tablet Take 1 tablet (50 mg total) by mouth every 2 (two) hours as needed for migraine. May repeat in 2 hours if headache persists or recurs. (Patient not taking: Reported on 10/23/2023) 10 tablet 6   topiramate (TOPAMAX) 50 MG tablet Take 1 tablet (50 mg total) by mouth at bedtime. (Patient not taking: Reported on 10/23/2023) 30 tablet 6   No Known Allergies  I have reviewed patient's Past Medical Hx, Surgical Hx, Family Hx, Social Hx, medications and allergies.   ROS:  Review of Systems  Constitutional:  Negative for chills and fever.  Gastrointestinal:  Negative for constipation and diarrhea.  Genitourinary:   Positive for pelvic pain (occasional cramps) and vaginal bleeding.  Musculoskeletal:  Negative for back pain.   Review of Systems  Other systems negative   Physical Exam  Physical Exam Patient Vitals for the past 24 hrs:  BP Temp Temp src Pulse Resp SpO2 Height Weight  10/29/23 0350 127/67 98.2 F (36.8 C) Oral 79 17 100 % 5\' 3"  (1.6 m) 106.7 kg   Constitutional: Well-developed, well-nourished female in no acute distress.  Cardiovascular: normal rate Respiratory: normal effort GI: Abd soft, non-tender.  MS: Extremities nontender, no edema, normal ROM Neurologic: Alert and oriented x 4.  GU: Neg CVAT.  PELVIC EXAM: Deferred in lieu of ultrasound   LAB RESULTS Results for orders placed or performed during the hospital encounter of 10/29/23 (from the past 24 hours)  Urinalysis, Routine w reflex microscopic -Urine, Clean Catch     Status: Abnormal   Collection Time: 10/29/23  4:20 AM  Result Value Ref Range   Color, Urine YELLOW YELLOW   APPearance HAZY (A) CLEAR   Specific Gravity, Urine 1.016 1.005 - 1.030   pH 5.0 5.0 - 8.0   Glucose, UA NEGATIVE NEGATIVE mg/dL   Hgb urine dipstick NEGATIVE NEGATIVE   Bilirubin Urine NEGATIVE NEGATIVE   Ketones, ur NEGATIVE NEGATIVE mg/dL   Protein, ur NEGATIVE NEGATIVE mg/dL   Nitrite NEGATIVE NEGATIVE   Leukocytes,Ua TRACE (A) NEGATIVE   RBC / HPF 0-5 0 - 5 RBC/hpf   WBC, UA 6-10 0 - 5 WBC/hpf   Bacteria, UA NONE SEEN NONE SEEN   Squamous Epithelial / HPF 11-20 0 - 5 /HPF   Mucus PRESENT     Blood type is O+    IMAGING US OB Comp Less 14 Wks Result Date: 10/29/2023 CLINICAL DATA:  119147.  Bleeding in early pregnancy. EXAM: OBSTETRIC <14 WK ULTRASOUND TECHNIQUE: Transabdominal ultrasound was performed for evaluation of the gestation as well as the maternal uterus and adnexal regions. COMPARISON:  None from this pregnancy. FINDINGS: Intrauterine gestational sac: Single. Yolk sac: Not Visualized. Likely completely absorbed at this  point. Embryo:  Visualized. Cardiac Activity: Visualized. Heart Rate: 149 bpm. Placenta: Anterior without visible previa. Amniotic fluid: Visually normal. Cervix: Not well seen. CRL:   50.2 mm   11 w 5 d +/-7 days;  [place] EDC: 05/14/2024. Subchorionic hemorrhage: Small hypoechoic subchorionic bleed anteriorly. Maternal uterus/adnexae: No uterine mass is evident. As above the cervix is not well seen. The ovaries are both normal in size and unremarkable. IMPRESSION: 1. Living single IUP measuring 11 weeks 5 days, ultrasound EDC 05/14/2024. 2. Small anterior subchorionic bleed. 3. Anatomic fetal survey suggested at 18-20 weeks' gestation. 4. Cervix is not well seen. Electronically Signed   By: Almira Bar M.D.   On: 10/29/2023 04:34     MAU Management/MDM: I have reviewed the triage vital signs  and the nursing notes.   Pertinent labs & imaging results that were available during my care of the patient were reviewed by me and considered in my medical decision making (see chart for details).      I have reviewed her medical records including past results, notes and treatments. Medical, Surgical, and family history were reviewed.  Medications and recent lab tests were reviewed  Will check baseline Ultrasound to rule out ectopic since we had no prior US This showed an 11+ week fetus with good heartbeat.  Small subchorionic hemorrhage.   Treatments in MAU included Ultrasound.   This bleeding/pain can represent a normal pregnancy with bleeding, spontaneous abortion or even an ectopic which can be life-threatening.  The process as listed above helps to determine which of these is present.    ASSESSMENT Single IUP at [redacted]w[redacted]d BLeeding in first trimester Subchorionic hemorhage, small  PLAN Discharge home Pelvic rest Bleeding precautions Followup in office as scheduled  Pt stable at time of discharge. Encouraged to return here if she develops worsening of symptoms, increase in pain, fever, or other  concerning symptoms.    Wynelle Bourgeois CNM, MSN Certified Nurse-Midwife 10/29/2023  4:23 AM

## 2023-11-01 ENCOUNTER — Telehealth: Payer: Medicaid Other | Admitting: Family Medicine

## 2023-11-01 DIAGNOSIS — Z3490 Encounter for supervision of normal pregnancy, unspecified, unspecified trimester: Secondary | ICD-10-CM

## 2023-11-01 DIAGNOSIS — N390 Urinary tract infection, site not specified: Secondary | ICD-10-CM

## 2023-11-01 NOTE — Progress Notes (Signed)
For the safety of you and your child, I recommend a face to face office visit with a health care provider.  Many mothers need to take medicines during their pregnancy and while nursing.  Almost all medicines pass into the breast milk in small quantities.  Most are generally considered safe for a mother to take but some medicines must be avoided.  After reviewing your E-Visit request, I recommend that you consult your OB/GYN or pediatrician for medical advice in relation to your condition and prescription medications while pregnant or breastfeeding.  NOTE:  There will be NO CHARGE for this eVisit  If you are having a true medical emergency please call 911.    For an urgent face to face visit, [place] has six urgent care centers for your convenience:     [facility] [facility] [place] Get Driving Directions [id number] [address] [place], [place] [address]    [facility]) Get Driving Directions [id number] [place], [place] [address]  [facility] [place] ([place]) Get Driving Directions [id number] [address] [place],  [place]  [address]  [facility]  Get Driving Directions [id number] [address] [place], [place], [place] [address]   [facility] [place] Get Driving Directions  [id number] [address] [place], [place] [address]   [facility] [place] Get Driving Directions [id number] [address], [place], [place] [address]  Your MyChart E-visit questionnaire answers were reviewed by a board certified advanced clinical practitioner to complete your personal care plan based on your specific symptoms.  Thank you for using e-Visits.   have provided 5 minutes of non face to face time during this encounter for chart review and documentation.

## 2023-11-06 NOTE — L&D Delivery Note (Cosign Needed Addendum)
 LABOR COURSE 28 yo G5P3013 at [redacted]w[redacted]d initially presented for IOL IUGR. Labor augmented with foley catheter (60cc) and AROM. GBS positive and received adequate intrapartum antibiotics prior to delivery.  Delivery Note Called to room and patient was complete and pushing. Head delivered without complication. No nuchal cord present. Shoulder and body delivered in usual fashion.   At 1945 a viable female was delivered via Vaginal, Spontaneous (Presentation: LOA).  Infant with spontaneous cry, placed on mother's abdomen, dried and stimulated. Cord clamped x 2 after 1-minute delay, and cut by maternal grandmother. Cord blood drawn. Placenta delivered spontaneously with gentle cord traction. Appears intact. Placenta sent to pathology due to IUGR. Fundus firm with massage and Pitocin . Labia, perineum, vagina, and cervix inspected with right periurethral abrasion, hemostatic and not repaired.   APGAR: 9 at 1 min, 9 at 5 min Cord: 3VC without complication  Anesthesia: Epidural Episiotomy: None Lacerations: Right periurethral abrasion Suture Repair: None Est. Blood Loss (mL): 50 cc  Mom to postpartum.  Baby to Couplet care / Skin to Skin.  Claudene Wells LABOR, MD 04/29/2024 7:55 PM

## 2023-11-12 ENCOUNTER — Telehealth: Payer: Medicaid Other | Admitting: Family Medicine

## 2023-11-12 DIAGNOSIS — N760 Acute vaginitis: Secondary | ICD-10-CM | POA: Diagnosis not present

## 2023-11-12 DIAGNOSIS — B9689 Other specified bacterial agents as the cause of diseases classified elsewhere: Secondary | ICD-10-CM

## 2023-11-12 MED ORDER — METRONIDAZOLE 500 MG PO TABS: 500.0000 mg | ORAL_TABLET | Freq: Two times a day (BID) | ORAL | 0 refills | Status: AC

## 2023-11-12 NOTE — Progress Notes (Signed)

## 2023-11-14 ENCOUNTER — Encounter: Payer: Medicaid Other | Admitting: Obstetrics & Gynecology

## 2023-11-17 DIAGNOSIS — D563 Thalassemia minor: Secondary | ICD-10-CM | POA: Insufficient documentation

## 2023-11-17 DIAGNOSIS — Z3009 Encounter for other general counseling and advice on contraception: Secondary | ICD-10-CM | POA: Insufficient documentation

## 2023-11-17 NOTE — Progress Notes (Signed)
 History:   Erika Klein is a 28 y.o. [id number] at [redacted]w[redacted]d by early ultrasound being seen today for her first obstetrical visit.   Patient does intend to breast feed.   Pregnancy history fully reviewed. Obstetrical history is significant for 3 FT SVD that were uncomplicated. Sh eis tested to 6lb12oz.   Patient reports stool from her vagina.       HISTORY: OB History  Gravida Para Term Preterm AB Living  5 3 3  0 1 3  SAB IAB Ectopic Multiple Live Births  1 0 0 0 3    # Outcome Date GA Lbr Len/2nd Weight Sex Type Anes PTL Lv  5 Current           4 Term 08/01/22 [redacted]w[redacted]d 05:44 / 00:12 6 lb 12.6 oz (3.08 kg) M Vag-Spont EPI  LIV     Name: Erika Klein     Apgar1: 9  Apgar5: 9  3 Term 2022    M Vag-Spont   LIV  2 Term 01/16/19 [redacted]w[redacted]d  5 lb 11 oz (2.58 kg) F Vag-Spont EPI  LIV     Birth Comments: ? 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   Pap 11/22  Colpo 1/23 CIN 1  Repeat pap smear with HPV co testing in 1 yr     Past Surgical History:  Procedure Laterality Date   anxiety     NO PAST SURGERIES     Family History  Problem Relation Age of Onset   Schizophrenia Mother    Bipolar disorder Mother    Anxiety disorder Mother    Cancer Mother    Cancer - Cervical Mother    Anxiety disorder Father    Asthma Father    Hypertension Maternal Aunt    Hypertension Maternal Grandmother    Stroke Paternal Grandmother    Hypertension Paternal Grandmother    Diabetes Neg Hx    Heart disease Neg Hx    Social History   Tobacco Use   Smoking status: Never   Smokeless tobacco: Never  Vaping Use   Vaping status: Never Used  Substance Use Topics    Alcohol use: Not Currently   Drug use: Never   No Known Allergies Current Outpatient Medications on File Prior to Visit  Medication Sig Dispense Refill   Prenatal MV & Min w/FA-DHA (PRENATAL GUMMIES) 0.18-25 MG CHEW Chew by mouth.     Prenatal Vit-Fe Fumarate-FA (PREPLUS) 27-1 MG TABS Take 1 tablet by mouth daily. (Patient not taking: Reported on 11/20/2023) 30 tablet 11   No current facility-administered medications on file prior to visit.    Review of Systems Pertinent items noted in HPI and remainder of comprehensive ROS otherwise negative.  Physical Exam:   Vitals:   11/20/23 1436  BP: 110/68  Pulse: 73  Weight: 232 lb (105.2 kg)   Fetal Heart Rate (bpm): 164  General: well-developed, well-nourished female in no acute distress  Breasts:  normal appearance, no masses or tenderness bilaterally  Skin: normal coloration and turgor, no rashes  Neurologic: oriented, normal, negative, normal mood  Extremities: normal strength, tone, and muscle mass, ROM of all joints is normal  HEENT PERRLA, extraocular movement intact and sclera clear, anicteric  Neck supple and no masses  Cardiovascular: regular rate and rhythm  Respiratory:  no respiratory distress, normal breath sounds  Abdomen: soft, non-tender; bowel sounds normal; no masses,  no organomegaly  Pelvic: normal external genitalia, no lesions, normal vaginal mucosa, normal vaginal discharge, normal cervix, pap smear done. 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 Supervision of other normal pregnancy, antepartum (Primary) Initial labs ordered. Declined flu shot.  Discussed ACURE4MOMs. Pt  will consider. Info given Continue prenatal vitamins. Problem list reviewed and updated. Horizon done 04/05/2022 - [facility] for alpha thal.  Genetic Screening discussed, NIPS: ordered. Ultrasound discussed; fetal anatomic survey: Scheduled for 1/20 but this will be too soon. Will reschedule for 18 weeks.  Anticipatory guidance about prenatal visits given including labs, ultrasounds, and testing. Discussed usage of Babyscripts and virtual visits  2. LGSIL on Pap smear of cervix Had LSIL pap (HPV not done) 09/2021 11/2022 Had pap normal/HPV negative.  Cotest again today.   3. Obesity affecting pregnancy in first trimester, unspecified obesity type BMI 42 pregravid  4. Other depression Mood is stable. Scores were normal.   5. Pregnancy with 14 completed weeks gestation Refer to urogyn for possible fistula. Nothing appreciated on exam today. Normal white d/c.     The nature of [facility] with multiple MDs and Advanced Practice Providers was explained to patient; also emphasized that residents, students are part of our team. Routine obstetric precautions reviewed. Encouraged to seek out care at office or emergency room [facility] preferred) for urgent and/or emergent concerns. Return in about 4 weeks (around 12/18/2023) for OB VISIT, MD or APP.    Lacey Pian, MD, FACOG Obstetrician & Gynecologist, [facility] for [facility], [facility]

## 2023-11-19 ENCOUNTER — Encounter: Payer: Self-pay | Admitting: Advanced Practice Midwife

## 2023-11-20 ENCOUNTER — Ambulatory Visit (INDEPENDENT_AMBULATORY_CARE_PROVIDER_SITE_OTHER): Payer: Medicaid Other | Admitting: Obstetrics and Gynecology

## 2023-11-20 ENCOUNTER — Other Ambulatory Visit (HOSPITAL_COMMUNITY)
Admission: RE | Admit: 2023-11-20 | Discharge: 2023-11-20 | Disposition: A | Discharge status: Home / Self Care | Payer: Medicaid Other | Source: Ambulatory Visit | Attending: Obstetrics and Gynecology | Admitting: Obstetrics and Gynecology

## 2023-11-20 ENCOUNTER — Other Ambulatory Visit: Payer: Self-pay

## 2023-11-20 ENCOUNTER — Encounter: Payer: Self-pay | Admitting: General Practice

## 2023-11-20 VITALS — BP 110/68 | HR 73 | Wt 232.0 lb

## 2023-11-20 DIAGNOSIS — R87612 Low grade squamous intraepithelial lesion on cytologic smear of cervix (LGSIL): Secondary | ICD-10-CM

## 2023-11-20 DIAGNOSIS — Z3A14 14 weeks gestation of pregnancy: Secondary | ICD-10-CM

## 2023-11-20 DIAGNOSIS — Z3480 Encounter for supervision of other normal pregnancy, unspecified trimester: Secondary | ICD-10-CM

## 2023-11-20 DIAGNOSIS — F3289 Other specified depressive episodes: Secondary | ICD-10-CM | POA: Diagnosis not present

## 2023-11-20 DIAGNOSIS — Z3009 Encounter for other general counseling and advice on contraception: Secondary | ICD-10-CM

## 2023-11-20 DIAGNOSIS — D563 Thalassemia minor: Secondary | ICD-10-CM

## 2023-11-20 DIAGNOSIS — O99211 Obesity complicating pregnancy, first trimester: Secondary | ICD-10-CM

## 2023-11-20 DIAGNOSIS — O99212 Obesity complicating pregnancy, second trimester: Secondary | ICD-10-CM

## 2023-11-20 NOTE — Patient Instructions (Signed)
Our practice his participating in a study that provides no-cost doula care. ACURE4Moms is a study looking at how doula care can reduce birthing disparities for Black and brown birthing people. We like to refer patients as soon as possible, but definitely before 28 weeks so patients can get to know their doula.    A doula is trained to provide support before, during and just after you give birth. While doulas do not provide medical care, they do provide emotional, physical and educational support. Doulas can help reduce your stress and comfort you and your partner. They can help you cope with labor by helping you use breathing techniques, massage, creative labor positioning, essential oils and affirmations.   ACURE4Moms is a research study trying to reduce:   low birthweight babies  emergency department visits & hospitalizations for birthing persons and their babies  depression among birthing people  discrimination in pregnancy-related care ACURE4Moms is trying out 2 programs designed by  people who have given birth. These programs include: 1. Sharing patient data and warning alerts with clinic staff to keep them accountable for their patients' outcomes and providing tools to help them  reduce bias in care. 2. Matching eligible patients with doulas from the  same community as the patients.  If you would like to participate in this study, please visit:   [email or web address]

## 2023-11-21 ENCOUNTER — Encounter: Payer: Medicaid Other | Admitting: Advanced Practice Midwife

## 2023-11-21 LAB — HEMOGLOBIN A1C
Est. average glucose Bld gHb Est-mCnc: 82 mg/dL
Hgb A1c MFr Bld: 4.5 % — ABNORMAL LOW (ref 4.8–5.6)

## 2023-11-21 LAB — CBC/D/PLT+RPR+RH+ABO+RUBIGG...
Antibody Screen: NEGATIVE
Basophils Absolute: 0 10*3/uL (ref 0.0–0.2)
Basos: 0 %
EOS (ABSOLUTE): 0.1 10*3/uL (ref 0.0–0.4)
Eos: 1 %
HCV Ab: NONREACTIVE
HIV Screen 4th Generation wRfx: NONREACTIVE
Hematocrit: 34.3 % (ref 34.0–46.6)
Hemoglobin: 11.4 g/dL (ref 11.1–15.9)
Hepatitis B Surface Ag: NEGATIVE
Immature Grans (Abs): 0 10*3/uL (ref 0.0–0.1)
Immature Granulocytes: 0 %
Lymphocytes Absolute: 1.6 10*3/uL (ref 0.7–3.1)
Lymphs: 32 %
MCH: 30.2 pg (ref 26.6–33.0)
MCHC: 33.2 g/dL (ref 31.5–35.7)
MCV: 91 fL (ref 79–97)
Monocytes Absolute: 0.5 10*3/uL (ref 0.1–0.9)
Monocytes: 10 %
Neutrophils Absolute: 2.7 10*3/uL (ref 1.4–7.0)
Neutrophils: 57 %
Platelets: 239 10*3/uL (ref 150–450)
RBC: 3.78 x10E6/uL (ref 3.77–5.28)
RDW: 11.8 % (ref 11.7–15.4)
RPR Ser Ql: NONREACTIVE
Rh Factor: POSITIVE
Rubella Antibodies, IGG: 1.8 {index} (ref >0.99)
WBC: 4.8 10*3/uL (ref 3.4–10.8)

## 2023-11-21 LAB — HCV INTERPRETATION

## 2023-11-21 LAB — URINE CULTURE, OB REFLEX

## 2023-11-21 LAB — CULTURE, OB URINE

## 2023-11-25 ENCOUNTER — Ambulatory Visit: Payer: Medicaid Other

## 2023-11-25 ENCOUNTER — Encounter: Payer: Self-pay | Admitting: *Deleted

## 2023-11-25 LAB — CYTOLOGY - PAP
Chlamydia: NEGATIVE
Comment: NEGATIVE
Comment: NEGATIVE
Comment: NORMAL
Diagnosis: NEGATIVE
Diagnosis: REACTIVE
High risk HPV: NEGATIVE
Neisseria Gonorrhea: NEGATIVE

## 2023-11-26 ENCOUNTER — Encounter: Payer: Self-pay | Admitting: Obstetrics and Gynecology

## 2023-11-29 LAB — PANORAMA PRENATAL TEST FULL PANEL:PANORAMA TEST PLUS 5 ADDITIONAL MICRODELETIONS: FETAL FRACTION: 9

## 2023-12-13 ENCOUNTER — Telehealth: Payer: Self-pay | Admitting: Advanced Practice Midwife

## 2023-12-18 ENCOUNTER — Encounter: Payer: Self-pay | Admitting: Advanced Practice Midwife

## 2023-12-18 NOTE — Telephone Encounter (Signed)
Called pt in response to questions about CenteringPregnancy. Discussed appointment routines and assured pt of privacy. Pt plans to continue with plan for CenteringPregnancy prenatal care. Pt also disclosed transportation and childcare barriers to appts in February. Offered drop-in childcare for 2/13 appt but pt said she won't have transportation. Since she has anatomy US 2/17, I don't think she needs a make-up appt but was given the choice to schedule one. She declined.

## 2023-12-19 ENCOUNTER — Encounter: Payer: Medicaid Other | Admitting: Advanced Practice Midwife

## 2023-12-19 DIAGNOSIS — R87612 Low grade squamous intraepithelial lesion on cytologic smear of cervix (LGSIL): Secondary | ICD-10-CM

## 2023-12-19 DIAGNOSIS — Z3A19 19 weeks gestation of pregnancy: Secondary | ICD-10-CM

## 2023-12-19 DIAGNOSIS — D563 Thalassemia minor: Secondary | ICD-10-CM

## 2023-12-19 DIAGNOSIS — Z3480 Encounter for supervision of other normal pregnancy, unspecified trimester: Secondary | ICD-10-CM

## 2023-12-19 DIAGNOSIS — O99212 Obesity complicating pregnancy, second trimester: Secondary | ICD-10-CM

## 2023-12-20 ENCOUNTER — Encounter: Payer: Self-pay | Admitting: Obstetrics and Gynecology

## 2023-12-23 ENCOUNTER — Encounter: Payer: Self-pay | Admitting: *Deleted

## 2023-12-23 ENCOUNTER — Ambulatory Visit: Payer: Medicaid Other | Admitting: *Deleted

## 2023-12-23 ENCOUNTER — Ambulatory Visit: Payer: Medicaid Other | Attending: Obstetrics & Gynecology

## 2023-12-23 ENCOUNTER — Other Ambulatory Visit: Payer: Self-pay | Admitting: Obstetrics & Gynecology

## 2023-12-23 VITALS — BP 115/60 | HR 80

## 2023-12-23 DIAGNOSIS — Z3A19 19 weeks gestation of pregnancy: Secondary | ICD-10-CM | POA: Diagnosis not present

## 2023-12-23 DIAGNOSIS — O99212 Obesity complicating pregnancy, second trimester: Secondary | ICD-10-CM | POA: Diagnosis not present

## 2023-12-23 DIAGNOSIS — Z148 Genetic carrier of other disease: Secondary | ICD-10-CM | POA: Insufficient documentation

## 2023-12-23 DIAGNOSIS — Z3480 Encounter for supervision of other normal pregnancy, unspecified trimester: Secondary | ICD-10-CM

## 2023-12-23 DIAGNOSIS — Z363 Encounter for antenatal screening for malformations: Secondary | ICD-10-CM | POA: Insufficient documentation

## 2023-12-24 ENCOUNTER — Other Ambulatory Visit: Payer: Self-pay | Admitting: *Deleted

## 2023-12-24 DIAGNOSIS — D563 Thalassemia minor: Secondary | ICD-10-CM

## 2023-12-24 DIAGNOSIS — O99210 Obesity complicating pregnancy, unspecified trimester: Secondary | ICD-10-CM

## 2024-01-15 DIAGNOSIS — R198 Other specified symptoms and signs involving the digestive system and abdomen: Secondary | ICD-10-CM | POA: Insufficient documentation

## 2024-01-16 ENCOUNTER — Encounter: Payer: Medicaid Other | Admitting: Advanced Practice Midwife

## 2024-01-16 DIAGNOSIS — R198 Other specified symptoms and signs involving the digestive system and abdomen: Secondary | ICD-10-CM

## 2024-01-21 ENCOUNTER — Ambulatory Visit: Payer: Medicaid Other | Attending: Obstetrics and Gynecology

## 2024-01-21 ENCOUNTER — Ambulatory Visit: Attending: Obstetrics and Gynecology | Admitting: Obstetrics and Gynecology

## 2024-01-21 ENCOUNTER — Other Ambulatory Visit: Payer: Self-pay | Admitting: *Deleted

## 2024-01-21 DIAGNOSIS — O3501X0 Maternal care for (suspected) central nervous system malformation or damage in fetus, agenesis of the corpus callosum, not applicable or unspecified: Secondary | ICD-10-CM | POA: Diagnosis not present

## 2024-01-21 DIAGNOSIS — O99210 Obesity complicating pregnancy, unspecified trimester: Secondary | ICD-10-CM | POA: Insufficient documentation

## 2024-01-21 DIAGNOSIS — E669 Obesity, unspecified: Secondary | ICD-10-CM

## 2024-01-21 DIAGNOSIS — O99012 Anemia complicating pregnancy, second trimester: Secondary | ICD-10-CM | POA: Diagnosis not present

## 2024-01-21 DIAGNOSIS — Z3A23 23 weeks gestation of pregnancy: Secondary | ICD-10-CM

## 2024-01-21 DIAGNOSIS — E6689 Other obesity not elsewhere classified: Secondary | ICD-10-CM

## 2024-01-21 DIAGNOSIS — O99212 Obesity complicating pregnancy, second trimester: Secondary | ICD-10-CM

## 2024-01-21 DIAGNOSIS — D563 Thalassemia minor: Secondary | ICD-10-CM | POA: Insufficient documentation

## 2024-01-21 DIAGNOSIS — O09892 Supervision of other high risk pregnancies, second trimester: Secondary | ICD-10-CM

## 2024-01-21 NOTE — Progress Notes (Signed)
  Maternal-Fetal Medicine Consultation I had the pleasure of seeing [name]. She is G4 P3003 at 23w 5d gestation and is here for completion of fetal anatomy. On cell-free fetal DNA screening, the risks of fetal aneuploidies are not increased. Pregravid BMI 43. Obstetric history significant for 3 term vaginal deliveries.  Ultrasound Fetal growth is appropriate for gestational age.  Amniotic fluid is normal and good fetal activity seen. The CSP could not be clearly visualized.  On coronal section, anterior horns appear to be fused (connected).  Corpus callosum is either thinned out or not seen.  Mid sagittal view was difficult to obtain. Lateral ventricular measurements are within normal limits and there is no evidence of ventriculomegaly.  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  In the absence of ventriculomegaly there is a high likelihood that absent CSP is isolated with no other structural brain anomalies.  Corpus callosum could not be clearly visualized and we will reassess in 4 weeks. I counseled the patient that isolated CSP is not usually associated with increased risk of chromosomal anomalies.  However, agenesis of corpus callosum can be associated with genetic syndromes. I discussed amniocentesis for fetal karyotype and microarray (to rule out genetic conditions).  I explained the procedure and possible complication of miscarriage/preterm delivery (1 and 500 cases). Patient opted not have amniocentesis. I counseled the patient that we will consider fetal brain MRI at her next visit.  Recommendations -An appointment was made for her to return in 4 weeks for fetal growth assessment and intracranial evaluation. -We will consider fetal brain MRI if CSP is absent and corpus callosal agenesis is suspected.  Consultation including face-to-face (more than 50%) counseling 20  minutes.

## 2024-01-23 ENCOUNTER — Telehealth: Payer: Self-pay | Admitting: Family Medicine

## 2024-01-23 NOTE — Telephone Encounter (Signed)
 Patient called to request that she be taken out of centering she would rather have regular ob visits.

## 2024-01-28 NOTE — Telephone Encounter (Signed)
 I called Erika Klein and inquired why she wished to stop centering. She said it was not due to any of [place], just the hours interferred with her childs school and the frequency of appointments. We discussed her appointments have been cancelled and she has a traditional ob visit scheduled. I thanked her for her feedback. [name]

## 2024-02-05 ENCOUNTER — Encounter: Admitting: Obstetrics and Gynecology

## 2024-02-10 ENCOUNTER — Ambulatory Visit: Payer: Medicaid Other | Admitting: Obstetrics

## 2024-02-13 ENCOUNTER — Other Ambulatory Visit (HOSPITAL_COMMUNITY)
Admission: RE | Admit: 2024-02-13 | Discharge: 2024-02-13 | Disposition: A | Discharge status: Home / Self Care | Source: Ambulatory Visit | Attending: Obstetrics and Gynecology | Admitting: Obstetrics and Gynecology

## 2024-02-13 ENCOUNTER — Ambulatory Visit: Admitting: Obstetrics and Gynecology

## 2024-02-13 ENCOUNTER — Encounter: Payer: Self-pay | Admitting: Obstetrics

## 2024-02-13 ENCOUNTER — Ambulatory Visit (INDEPENDENT_AMBULATORY_CARE_PROVIDER_SITE_OTHER): Payer: Medicaid Other | Admitting: Obstetrics

## 2024-02-13 VITALS — BP 117/77 | HR 90 | Wt 245.0 lb

## 2024-02-13 VITALS — BP 111/68 | HR 92 | Ht 64.0 in | Wt 245.0 lb

## 2024-02-13 DIAGNOSIS — R102 Pelvic and perineal pain: Secondary | ICD-10-CM | POA: Diagnosis not present

## 2024-02-13 DIAGNOSIS — K5900 Constipation, unspecified: Secondary | ICD-10-CM | POA: Diagnosis not present

## 2024-02-13 DIAGNOSIS — R350 Frequency of micturition: Secondary | ICD-10-CM | POA: Insufficient documentation

## 2024-02-13 DIAGNOSIS — Z3A27 27 weeks gestation of pregnancy: Secondary | ICD-10-CM

## 2024-02-13 DIAGNOSIS — O99212 Obesity complicating pregnancy, second trimester: Secondary | ICD-10-CM | POA: Diagnosis not present

## 2024-02-13 DIAGNOSIS — N898 Other specified noninflammatory disorders of vagina: Secondary | ICD-10-CM | POA: Diagnosis present

## 2024-02-13 DIAGNOSIS — O99213 Obesity complicating pregnancy, third trimester: Secondary | ICD-10-CM

## 2024-02-13 DIAGNOSIS — Z3480 Encounter for supervision of other normal pregnancy, unspecified trimester: Secondary | ICD-10-CM

## 2024-02-13 DIAGNOSIS — R151 Fecal smearing: Secondary | ICD-10-CM | POA: Diagnosis not present

## 2024-02-13 LAB — POCT URINALYSIS DIPSTICK
Bilirubin, UA: NEGATIVE
Blood, UA: NEGATIVE
Glucose, UA: NEGATIVE
Leukocytes, UA: NEGATIVE
Nitrite, UA: NEGATIVE
Protein, UA: POSITIVE — AB
Spec Grav, UA: 1.02 (ref 1.010–1.025)
Urobilinogen, UA: >=8 U/dL — AB
pH, UA: 7 (ref 5.0–8.0)

## 2024-02-13 MED ORDER — LIDOCAINE 5 % EX OINT: TOPICAL_OINTMENT | CUTANEOUS | 0 refills | Status: DC

## 2024-02-13 MED ORDER — ASPIRIN 81 MG PO TBEC
81.0000 mg | DELAYED_RELEASE_TABLET | Freq: Every day | ORAL | 2 refills | Status: DC
Start: 2024-02-13 — End: 2024-05-01

## 2024-02-13 MED ORDER — PRENATAL GUMMIES 0.18-25 MG PO CHEW: CHEWABLE_TABLET | ORAL | 4 refills | Status: DC

## 2024-02-13 NOTE — Progress Notes (Signed)
 New Patient Evaluation and Consultation  Referring Provider: Milas Hock, MD PCP: Patient, No Pcp Per Date of Service: 02/13/2024  SUBJECTIVE Chief Complaint: New Patient (Initial Visit) Erika Klein is a 28 y.o. female her today fistula.)  History of Present Illness: Erika Klein is a 28 y.o. 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   Fecal smearing started 1 year ago, remains unchanged. The patient first noticed some stool on the toilet paper when she wipes postvoid. Reports constipation with bristol IV stool requires prolonged period to defecate around 25-36mins.  Denies blood, weight loss, hemorrhoids, pelvic surgery, trauma, or radiation. Denies colonoscopy or family history of colon cancer, IBD.  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 Denies new medication, surgeries, trauma.  Last SVD 9/27/2023m 3080g infant, no laceration per chart review.  Reports perineal pain described as throbbing 8/10, started around 09/2023 when she found out she was pregnant Unable to take OTC medication Reports shocking pain in the back of her head and sent to neurology prior to pregnanct, prescribed topamax for migraine but did not start.   Review of records significant for: Periurethral vaginal tear with vaginal delivery on 08/16/2021  Urinary Symptoms: Does not leak urine.   Day time voids 10.  Nocturia: 2 times per night to void. Voiding dysfunction:  empties bladder well.  Patient does not use a catheter to empty bladder.  When urinating, patient feels dribbling after finishing  UTIs:  0  UTI's in the last year.   Denies history of blood in urine, kidney or bladder stones, pyelonephritis, bladder cancer, and kidney cancer No results found for the last 90 days.   Pelvic  Organ Prolapse Symptoms:                  Patient Denies a feeling of a bulge the vaginal area.   Bowel Symptom: Bowel movements: 4-5 time(s) per week Stool consistency: soft  Straining: no.  Splinting: no.  Incomplete evacuation: no.  Patient Admits to accidental bowel leakage / fecal incontinence  Occurs: 2-3 time(s) per day  Consistency with leakage:  thin line when she wipes postvoid when she has a bowel movement Bowel regimen: none Last colonoscopy: none [facility] Colonoscopy   This patient has no relevant Health Maintenance data.     Sexual Function Sexually active: yes.  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  Repeat pap smear with HPV co testing in 1 yr       Past Surgical History:   Past Surgical History:  Procedure Laterality Date   NO PAST SURGERIES       Past OB/GYN History: OB History  Gravida Para Term Preterm AB Living  5 3 3  0 1 3  SAB IAB Ectopic Multiple Live Births  1 0 0 0 3    # Outcome Date GA Lbr Len/2nd Weight Sex Type Anes PTL Lv  5 Current           4 Term 08/01/22 [redacted]w[redacted]d 05:44 / 00:12 6 lb 12.6 oz (3.08 kg) M Vag-Spont EPI  LIV  3 Term 2022    F Vag-Spont   LIV  2 Term 01/16/19 [redacted]w[redacted]d  5 lb 11 oz (2.58 kg) M Vag-Spont EPI  LIV     Birth Comments: ? IUGR  1 SAB 09/25/17            Vaginal deliveries: 3,  Forceps/ Vacuum deliveries: 0, Cesarean section: 0 History of periurethral laceration Menopausal: No, LMP Patient's last menstrual period was 07/13/2023. Contraception: currently pregnant. Last pap smear wa.  Any history of abnormal pap smears: yes.     Component Value Date/Time   DIAGPAP  11/20/2023 1517    - Negative for Intraepithelial Lesions or Malignancy (NILM)   DIAGPAP - Benign reactive/reparative changes 11/20/2023 1517   DIAGPAP  11/26/2022 1120    - Negative for intraepithelial lesion or malignancy (NILM)   HPVHIGH Negative 11/20/2023 1517   ADEQPAP  11/20/2023 1517    Satisfactory for evaluation; transformation zone component PRESENT.   ADEQPAP  11/26/2022 1120    Satisfactory for evaluation; transformation zone component PRESENT.   ADEQPAP  10/02/2021 1619    Satisfactory for evaluation; transformation zone component PRESENT.    Medications: Patient has a current medication list which includes the following prescription(s): lidocaine, aspirin ec, and prenatal gummies.   Allergies: Patient has no known allergies.   Social History:  Social History   Tobacco Use   Smoking status: Never   Smokeless tobacco: Never  Vaping Use   Vaping status: Never Used  Substance Use Topics   Alcohol use: Not Currently   Drug use: Never    Relationship status: long-term partner Patient lives with her boyfriend.   Patient is not employed. Regular exercise: Yes: walking History of abuse: No  Family History:   Family History  Problem Relation Age of Onset   Schizophrenia Mother    Bipolar disorder Mother    Anxiety disorder Mother    Uterine cancer Mother    Anxiety disorder Father    Asthma Father    Hypertension Maternal Grandmother    Stroke Paternal Grandmother    Hypertension Paternal Grandmother    Hypertension Maternal Aunt    Diabetes Neg Hx    Heart disease Neg Hx    Bladder Cancer Neg Hx    Renal cancer Neg Hx      Review of Systems: Review of Systems  Constitutional:  Positive for weight loss. Negative for fever and malaise/fatigue.  Respiratory:  Negative for cough, shortness of breath and wheezing.   Cardiovascular:  Negative for chest pain, palpitations and leg swelling.  Gastrointestinal:  Positive  for constipation. Negative for abdominal pain and blood in stool.  Genitourinary:  Positive for frequency. Negative for dysuria, hematuria and urgency.  Skin:  Negative for rash.  Neurological:  Negative for dizziness, weakness and headaches.  Endo/Heme/Allergies:  Does not bruise/bleed easily.  Psychiatric/Behavioral:  Positive for depression. The patient is nervous/anxious.      OBJECTIVE Physical Exam: Vitals:   02/13/24 1501  BP: 111/68  Pulse: 92  Weight: 245 lb (111.1 kg)  Height: 5\' 4"  (1.626 m)   Physical Exam Constitutional:      General: She is not in acute distress.    Appearance: Normal appearance.  Genitourinary:     Bladder and urethral meatus normal.     No lesions in the  vagina.     Right Labia: tenderness.     Right Labia: No rash, lesions, skin changes or Bartholin's cyst.    Left Labia: tenderness.     Left Labia: No lesions, skin changes, Bartholin's cyst or rash.       Vaginal discharge (white) present.     No vaginal erythema, tenderness, bleeding, ulceration or granulation tissue.     Anterior vaginal prolapse present.    No vaginal atrophy present.    Vaginal exam comments: No fecal material or abnormal discharge pooling at vaginal apex.      Right Adnexa: not tender, not full and no mass present.    Left Adnexa: not tender, not full and no mass present.    No cervical motion tenderness, discharge, friability, lesion, polyp or nabothian cyst.     Uterus is not enlarged, fixed, tender or irregular.     No uterine mass detected.    Urethral meatus caruncle not present.    No urethral prolapse, tenderness, mass, hypermobility, discharge or stress urinary incontinence with cough stress test present.     Bladder is not tender, urgency on palpation not present and masses not present.      Pelvic Floor: Levator muscle strength is 3/5.    Levator ani is tender and obturator internus is tender.     No asymmetrical contractions present and no pelvic  spasms present.    Anal wink present and BC reflex present. Cardiovascular:     Rate and Rhythm: Normal rate.  Pulmonary:     Effort: Pulmonary effort is normal. No respiratory distress.  Abdominal:     General: There is no distension.     Palpations: Abdomen is soft. There is no mass.     Tenderness: There is no abdominal tenderness.     Hernia: No hernia is present.     Comments: gravid  Neurological:     Mental Status: She is alert.  Vitals reviewed. Exam conducted with a chaperone present.      POP-Q:   POP-Q  -2                                            Aa   -2                                           Ba  -7                                              C   2                                            Gh  3                                            Pb  10  tvl   -3                                            Ap  -3                                            Bp  -9                                              D    Laboratory Results: Lab Results  Component Value Date   COLORU Dark yellow 02/13/2024   CLARITYU Clear 02/13/2024   GLUCOSEUR Negative 02/13/2024   BILIRUBINUR Negative 02/13/2024   KETONESU Trace 02/13/2024   SPECGRAV 1.020 02/13/2024   RBCUR Negative 02/13/2024   PHUR 7.0 02/13/2024   PROTEINUR Positive (A) 02/13/2024   UROBILINOGEN >=8.0 (A) 02/13/2024   LEUKOCYTESUR Negative 02/13/2024    Lab Results  Component Value Date   CREATININE 0.79 08/16/2021   CREATININE 0.71 08/11/2021   CREATININE 0.81 05/11/2020    Lab Results  Component Value Date   HGBA1C 4.5 (L) 11/20/2023    Lab Results  Component Value Date   HGB 11.4 11/20/2023     ASSESSMENT AND PLAN Ms. Cumbo is a 28 y.o. with:  1. Constipation, unspecified constipation type   2. Pelvic pain   3. Fecal smearing   4. Urinary frequency     Constipation, unspecified constipation type Assessment & Plan: - For  optimization of stool consistency, we reviewed the importance of a better bowel regimen.  We also discussed the importance of avoiding chronic straining, as it can exacerbate her pelvic floor symptoms; we discussed treating constipation and straining prior to surgery, as postoperative straining can lead to damage to the repair and recurrence of symptoms. We discussed initiating therapy with increasing fluid intake, fiber supplementation, stool softeners, and laxatives such as miralax.  - encouraged titration of fiber supplementation or miralax - encouraged squatting position to avoid straining and limiting time spent on the commode to  Orders: -     AMB referral to rehabilitation  Pelvic pain Assessment & Plan: - vulvar pain and pain with palpation of pelvic floor muscles bilaterally - The origin of pelvic floor muscle spasm can be multifactorial, including primary, reactive to a different pain source, trauma, or even part of a centralized pain syndrome.Treatment options include pelvic floor physical therapy, local (vaginal) or oral  muscle relaxants, pelvic muscle trigger point injections or centrally acting pain medications.   - provided handout regarding vulvodynia, reviewed comfort measures and treatment options.  - Rx topical lidocaine 1g PRN pain up to 3x/day - Consider Rx Amitriptyline 2.5%/ gabapentin 2.5%/ baclofen 2.5% in vaginal cream. If refractory - discussed conservative management options with cold compress after intercourse  - referral for pelvic floor PT appointment   Orders: -     AMB referral to rehabilitation -     Lidocaine; Apply 0.5g peasize at vaginal opening up to 3 times a day as needed for comfort  Dispense: 35.44 g; Refill: 0  Fecal smearing Assessment & Plan: - encouraged titration of fiber supplementation or miralax to  assess symptoms - referral to pelvic floor PT - denies hematochezia, weight loss, hemorrhoids, IBD or family history of colon cancer -  reassess symptoms postpartum or if clinical change   Urinary frequency Assessment & Plan: - POCT UA + ketones, protein and uro.  - likely due to pregnancy - referral to pelvic floor PT - reassess postpartum  Orders: -     POCT urinalysis dipstick  Time spent: I spent 63 minutes dedicated to the care of this patient on the date of this encounter to include pre-visit review of records, face-to-face time with the patient discussing fecal smearing, pelvic floor myofascial pain, urinary frequency, constipation, and post visit documentation and ordering medication/ testing.   Loleta Chance, MD

## 2024-02-13 NOTE — Assessment & Plan Note (Addendum)
-   For optimization of stool consistency, we reviewed the importance of a better bowel regimen.  We also discussed the importance of avoiding chronic straining, as it can exacerbate her pelvic floor symptoms; we discussed treating constipation and straining prior to surgery, as postoperative straining can lead to damage to the repair and recurrence of symptoms. We discussed initiating therapy with increasing fluid intake, fiber supplementation, stool softeners, and laxatives such as miralax.  - encouraged titration of fiber supplementation or miralax - encouraged squatting position to avoid straining and limiting time spent on the commode to 

## 2024-02-13 NOTE — Assessment & Plan Note (Signed)
-   POCT UA + ketones, protein and uro.  - likely due to pregnancy - referral to pelvic floor PT - reassess postpartum

## 2024-02-13 NOTE — Patient Instructions (Signed)
 The origin of pelvic floor muscle spasm can be multifactorial, including primary, reactive to a different pain source, trauma, or even part of a centralized pain syndrome.Treatment options include pelvic floor physical therapy, local (vaginal) or oral  muscle relaxants, pelvic muscle trigger point injections or centrally acting pain medications.     Please call [phone] to schedule the earliest appointment for pelvic floor PT.  Constipation: Our goal is to achieve formed bowel movements daily or every-other-day.  You may need to try different combinations of the following options to find what works best for you - everybody's body works differently so feel free to adjust the dosages as needed.  Some options to help maintain bowel health include:  Dietary changes (more leafy greens, vegetables and fruits; less processed foods) Fiber supplementation (Benefiber, FiberCon, Metamucil or Psyllium). Start slow and increase gradually to full dose. Over-the-counter agents such as: stool softeners (Docusate or Colace) and/or laxatives (Miralax, milk of magnesia)  "Power Pudding" is a natural mixture that may help your constipation.  To make blend 1 cup applesauce, 1 cup wheat bran, and 3/4 cup prune juice, refrigerate and then take 1 tablespoon daily with a large glass of water as needed.   Women should try to eat at least 21 to 25 grams of fiber a day, while men should aim for 30 to 38 grams a day. You can add fiber to your diet with food or a fiber supplement such as psyllium (metamucil), benefiber, or fibercon.   Here's a look at how much dietary fiber is found in some common foods. When buying packaged foods, check the Nutrition Facts label for fiber content. It can vary among brands.  Fruits Serving size Total fiber (grams)*  Raspberries 1 cup 8.0  Pear 1 medium 5.5  Apple, with skin 1 medium 4.5  Banana 1 medium 3.0  Orange 1 medium 3.0  Strawberries 1 cup 3.0   Vegetables Serving size Total  fiber (grams)*  Green peas, boiled 1 cup 9.0  Broccoli, boiled 1 cup chopped 5.0  Turnip greens, boiled 1 cup 5.0  Brussels sprouts, boiled 1 cup 4.0  Potato, with skin, baked 1 medium 4.0  Sweet corn, boiled 1 cup 3.5  Cauliflower, raw 1 cup chopped 2.0  Carrot, raw 1 medium 1.5   Grains Serving size Total fiber (grams)*  Spaghetti, whole-wheat, cooked 1 cup 6.0  Barley, pearled, cooked 1 cup 6.0  Bran flakes 3/4 cup 5.5  Quinoa, cooked 1 cup 5.0  Oat bran muffin 1 medium 5.0  Oatmeal, instant, cooked 1 cup 5.0  Popcorn, air-popped 3 cups 3.5  Brown rice, cooked 1 cup 3.5  Bread, whole-wheat 1 slice 2.0  Bread, rye 1 slice 2.0   Legumes, nuts and seeds Serving size Total fiber (grams)*  Split peas, boiled 1 cup 16.0  Lentils, boiled 1 cup 15.5  Black beans, boiled 1 cup 15.0  Baked beans, canned 1 cup 10.0  Chia seeds 1 ounce 10.0  Almonds 1 ounce (23 nuts) 3.5  Pistachios 1 ounce (49 nuts) 3.0  Sunflower kernels 1 ounce 3.0  *Rounded to nearest 0.5 gram. Source: [employer] for [employer]	[place]

## 2024-02-13 NOTE — Assessment & Plan Note (Addendum)
-   encouraged titration of fiber supplementation or miralax to assess symptoms - referral to pelvic floor PT - denies hematochezia, weight loss, hemorrhoids, IBD or family history of colon cancer - reassess symptoms postpartum or if clinical change

## 2024-02-13 NOTE — Assessment & Plan Note (Addendum)
-   vulvar pain and pain with palpation of pelvic floor muscles bilaterally - The origin of pelvic floor muscle spasm can be multifactorial, including primary, reactive to a different pain source, trauma, or even part of a centralized pain syndrome.Treatment options include pelvic floor physical therapy, local (vaginal) or oral  muscle relaxants, pelvic muscle trigger point injections or centrally acting pain medications.   - provided handout regarding vulvodynia, reviewed comfort measures and treatment options.  - Rx topical lidocaine 1g PRN pain up to 3x/day - Consider Rx Amitriptyline 2.5%/ gabapentin 2.5%/ baclofen 2.5% in vaginal cream. If refractory - discussed conservative management options with cold compress after intercourse  - referral for pelvic floor PT appointment

## 2024-02-13 NOTE — Progress Notes (Signed)
   PRENATAL VISIT NOTE  Subjective:  Erika Klein is a 28 y.o. [id number] at [redacted]w[redacted]d being seen today for ongoing prenatal care.  She is currently monitored for the following issues for this low-risk pregnancy and has Anxiety; Depression; LGSIL on Pap smear of cervix; Obesity affecting pregnancy; Supervision of other normal pregnancy, antepartum; Unwanted fertility; Alpha thalassemia silent carrier; Abnormal findings of gastrointestinal tract; Constipation; Pelvic pain; and Fecal smearing on their problem list.  Patient reports no complaints.  Contractions: Not present. Vag. Bleeding: None.  Movement: Present. Denies leaking of fluid.   The following portions of the patient's history were reviewed and updated as appropriate: allergies, current medications, past family history, past medical history, past social history, past surgical history and problem list.   Objective:   Vitals:   02/13/24 1634  BP: 117/77  Pulse: 90  Weight: 245 lb (111.1 kg)    Fetal Status: Fetal Heart Rate (bpm): 154   Movement: Present     General:  Alert, oriented and cooperative. Patient is in no acute distress.  Skin: Skin is warm and dry. No rash noted.   Cardiovascular: Normal heart rate noted  Respiratory: Normal respiratory effort, no problems with respiration noted  Abdomen: Soft, gravid, appropriate for gestational age.  Pain/Pressure: Absent     Pelvic: Cervical exam deferred        Extremities: Normal range of motion.  Edema: None  Mental Status: Normal mood and affect. Normal behavior. Normal judgment and thought content.   Assessment and Plan:  Pregnancy: [id number] at [redacted]w[redacted]d 1. Supervision of other normal pregnancy, antepartum (Primary) BP and FHR normal Doing well, feeling regular movement  Suspected absent septum pellucidum (ASP) ,Following MFM   2. [redacted] weeks gestation of pregnancy Anticipatory guidance regarding GTT and labs next visit, discussed NPO status after midnight  BTL signed today  -  Prenatal MV & Min w/FA-DHA (PRENATAL GUMMIES) 0.18-25 MG CHEW; Take 2 gummies by mouth every day  Dispense: 60 tablet; Refill: 4  3. Vaginal odor Swab collected today   3. Vaginal odor  - Cervicovaginal ancillary only( [place])  4. Obesity affecting pregnancy in third trimester, unspecified obesity type - aspirin EC 81 MG tablet; Take 1 tablet (81 mg total) by mouth daily. Start taking when you are [redacted] weeks pregnant for rest of pregnancy for prevention of preeclampsia  Dispense: 300 tablet; Refill: 2    Preterm labor symptoms and general obstetric precautions including but not limited to vaginal bleeding, contractions, leaking of fluid and fetal movement were reviewed in detail with the patient. Please refer to After Visit Summary for other counseling recommendations.   Return in about 2 weeks (around 02/27/2024) for OB VISIT (MD or APP).  Future Appointments  Date Time Provider Department Center  02/18/2024 10:30 AM [facility] US3 [facility]  02/20/2024  8:20 AM [facility] LAB [facility]  05/14/2024  3:00 PM Loleta Chance, MD [facility]    Albertine Grates, FNP

## 2024-02-17 ENCOUNTER — Encounter: Payer: Self-pay | Admitting: *Deleted

## 2024-02-18 ENCOUNTER — Other Ambulatory Visit: Payer: Self-pay | Admitting: Obstetrics and Gynecology

## 2024-02-18 ENCOUNTER — Ambulatory Visit: Attending: Obstetrics and Gynecology

## 2024-02-18 ENCOUNTER — Ambulatory Visit (HOSPITAL_BASED_OUTPATIENT_CLINIC_OR_DEPARTMENT_OTHER): Admitting: Maternal & Fetal Medicine

## 2024-02-18 ENCOUNTER — Other Ambulatory Visit: Payer: Self-pay | Admitting: *Deleted

## 2024-02-18 ENCOUNTER — Other Ambulatory Visit: Payer: Self-pay

## 2024-02-18 DIAGNOSIS — Z3A27 27 weeks gestation of pregnancy: Secondary | ICD-10-CM | POA: Diagnosis present

## 2024-02-18 DIAGNOSIS — E669 Obesity, unspecified: Secondary | ICD-10-CM

## 2024-02-18 DIAGNOSIS — O402XX0 Polyhydramnios, second trimester, not applicable or unspecified: Secondary | ICD-10-CM | POA: Diagnosis present

## 2024-02-18 DIAGNOSIS — O99212 Obesity complicating pregnancy, second trimester: Secondary | ICD-10-CM

## 2024-02-18 DIAGNOSIS — O365920 Maternal care for other known or suspected poor fetal growth, second trimester, not applicable or unspecified: Secondary | ICD-10-CM | POA: Insufficient documentation

## 2024-02-18 DIAGNOSIS — Z148 Genetic carrier of other disease: Secondary | ICD-10-CM

## 2024-02-18 DIAGNOSIS — O99210 Obesity complicating pregnancy, unspecified trimester: Secondary | ICD-10-CM

## 2024-02-18 DIAGNOSIS — Z3480 Encounter for supervision of other normal pregnancy, unspecified trimester: Secondary | ICD-10-CM

## 2024-02-18 DIAGNOSIS — O365990 Maternal care for other known or suspected poor fetal growth, unspecified trimester, not applicable or unspecified: Secondary | ICD-10-CM | POA: Insufficient documentation

## 2024-02-18 LAB — CERVICOVAGINAL ANCILLARY ONLY
Bacterial Vaginitis (gardnerella): NEGATIVE
Candida Glabrata: NEGATIVE
Candida Vaginitis: NEGATIVE
Chlamydia: NEGATIVE
Comment: NEGATIVE
Comment: NEGATIVE
Comment: NEGATIVE
Comment: NEGATIVE
Comment: NEGATIVE
Comment: NORMAL
Neisseria Gonorrhea: NEGATIVE
Trichomonas: NEGATIVE

## 2024-02-18 NOTE — Progress Notes (Signed)
 Patient information  Patient Name: Erika Klein  Patient MRN:   [id number]  Referring practice: MFM Referring Provider: [facility])  MFM CONSULT  Erika Klein is a 28 y.o. [phone] at [redacted]w[redacted]d here for ultrasound and consultation. Patient Active Problem List   Diagnosis Date Noted   Poor fetal growth affecting management of mother in second trimester 02/18/2024   Constipation 02/13/2024   Pelvic pain 02/13/2024   Fecal smearing 02/13/2024   Urinary frequency 02/13/2024   Abnormal findings of gastrointestinal tract 01/15/2024   Unwanted fertility 11/17/2023   Alpha thalassemia silent carrier 11/17/2023   Supervision of other normal pregnancy, antepartum 10/23/2023   LGSIL on Pap smear of cervix 03/06/2022   Obesity affecting pregnancy 03/06/2022   Anxiety    Depression     Erika Klein is doing well today with no acute concerns.  RE suspected fetal growth restriction: New onset fetal growth restriction was seen on today's ultrasound with an estimated fetal weight at the 7th percentile. I discussed the diagnosis, management and prognosis of fetal growth restriction (FGR) with the patient today. I discussed the various causes of growth restriction including constitutionally small fetus, placental insufficiency, genetic problems and chronic maternal disease. Currently there is no evidence of sonographic stigmata suggesting infection or aneuploidy.  I discussed the most likely cause of her fetal growth restriction is either a constitutionally small fetus or placental insufficiency. We discussed it is often challenging to differentiate between a fetus that is constitutionally small but is fulfilling its growth potential and a fetus that is not fulfilling its growth potential because of an underlying pathologic condition. Approximately 70% of fetuses with birth weight below the 10th percentile for gestational age are constitutionally small; in the remaining  30%, the cause of the small size is pathologic, meeting intrauterine growth restriction definition.  However, the smaller the fetus in the earlier onset of growth restriction the more likely there is a pathologic process causing the growth restriction.  We also discussed the potential for genetic or infectious causes which would only be diagnosed with amniocentesis.  The patient is not interested in this at this time given that there is no treatment for either condition.  I also discussed the importance of antenatal fetal surveillance including antenatal testing and umbilical artery Doppler assessment to reduce the risk of stillbirth.  I discussed the management going forward in the pregnancy with potential alteration in the timing of delivery.  I also discussed the increased risk of preeclampsia in addition to growth restriction which are often coincident especially with early onset growth restriction.  RE polyhydramnios:  Polyhydramnios was seen on today's ultrasound. There cardiac anatomy appears normal. There is no evidence of chorioangioma. The fetal movement is normal without evidence of arthrogryposis. The fetal stomach and bladder appears normal. The fetal palate (although limited in views) and neck appears to be normal without evidence of mass. There is no evidence of of lower spine or pelvic abnormalities.   I discussed the diagnosis, management, prognosis and clinical implications of polyhydramnios and the clinical implications during pregnancy. Amniotic fluid is made from the fetal kidneys and at the normal fluid ranges from 5 to 25 cm on amniotic fluid index.  Potential causes of elevated amniotic fluid in pregnancy. I discussed that most the time the cause is unknown but the most common cause that is identifiable is maternal diabetes.  Also discussed that there is a potential for birth defects or neurologic conditions that can  result in elevated amniotic fluid. Potential complications associated  with polyhydramnios include malpresentation, increased risk of cord prolapse, increased discomfort during the end of pregnancy, increased risk of preterm contractions and increased risk of stillbirth.  In particular the risk of fetal anomalies and neonatal abnormalities increases with the degree of polyhydramnios.  Approximately 1% of newborns and 5 to 10% of fetuses with mild polyhydramnios will have an abnormality (tracheoesophageal fistula are among the most common abnormalities associated with polyhydramnios).  This increases to 2-5 times a risk with moderate and severe polyhydramnios.  Currently there is no treatment of polyhydramnios if it is idiopathic or due to fetal abnormalities.  If the elevation in fluid is related to maternal diabetes, better glucose control should reduce the degree of polyhydramnios from osmotic diuresis.  Sonographic findings Single intrauterine pregnancy at 27w 5d. Fetal cardiac activity:  Observed and appears normal. Presentation: Breech. Interval fetal anatomy appears normal. Fetal biometry shows the estimated fetal weight at the 7 percentile and the abdominal circumference at the 21 percentile.  Amniotic fluid: Polyhydramnios.  MVP: 8.49 cm. Placenta: Anterior. Umbilical artery dopplers findings: -S/D:3.33 which are normal at this gestational age.  -Absent end-diastolic flow: No.  -Reversed end-diastolic flow:  No. Normal movement and tone.     There are limitations of prenatal ultrasound such as the inability to detect certain abnormalities due to poor visualization. Various factors such as fetal position, gestational age and maternal body habitus may increase the difficulty in visualizing the fetal anatomy.    Recommendations - Glucose challenge test to be done in the next week  - NST next week and then weekly thereafter  - Follow-up growth ultrasound in 3 weeks  - Umbilical artery Dopplers every 1 to 2 weeks - Remainder of prenatal care will be dictated  based on the antenatal testing and ultrasound findings   Review of Systems: A review of systems was performed and was negative except per HPI   Vitals and Physical Exam    02/18/2024   10:05 AM 02/13/2024    4:34 PM 02/13/2024    3:01 PM  Vitals with BMI  Height   5\' 4"   Weight  245 lbs 245 lbs  BMI  42.03 42.03  Systolic 121 117 161  Diastolic 64 77 68  Pulse 80 90 92    Sitting comfortably on the sonogram table Nonlabored breathing Normal rate and rhythm Abdomen is nontender  Past pregnancies OB History  Gravida Para Term Preterm AB Living  5 3 3  0 1 3  SAB IAB Ectopic Multiple Live Births  1 0 0 0 3    # Outcome Date GA Lbr Len/2nd Weight Sex Type Anes PTL Lv  5 Current           4 Term 08/01/22 [redacted]w[redacted]d 05:44 / 00:12 6 lb 12.6 oz (3.08 kg) M Vag-Spont EPI  LIV  3 Term 2022    F Vag-Spont   LIV  2 Term 01/16/19 [redacted]w[redacted]d  5 lb 11 oz (2.58 kg) M Vag-Spont EPI  LIV     Birth Comments: ? IUGR  1 SAB 09/25/17            I spent 30 minutes reviewing the patients chart, including labs and images as well as counseling the patient about her medical conditions. Greater than 50% of the time was spent in direct face-to-face patient counseling.  Braxton Feathers  MFM, [facility]   02/18/2024  11:32 AM

## 2024-02-19 ENCOUNTER — Encounter: Payer: Self-pay | Admitting: Obstetrics & Gynecology

## 2024-02-20 ENCOUNTER — Other Ambulatory Visit: Payer: Self-pay

## 2024-02-20 ENCOUNTER — Other Ambulatory Visit

## 2024-02-20 DIAGNOSIS — Z3A27 27 weeks gestation of pregnancy: Secondary | ICD-10-CM

## 2024-02-20 DIAGNOSIS — Z3480 Encounter for supervision of other normal pregnancy, unspecified trimester: Secondary | ICD-10-CM

## 2024-02-20 MED ORDER — PRENATAL GUMMIES 0.18-25 MG PO CHEW: CHEWABLE_TABLET | ORAL | 4 refills | Status: DC

## 2024-02-21 LAB — CBC
Hematocrit: 33.5 % — ABNORMAL LOW (ref 34.0–46.6)
Hemoglobin: 10.8 g/dL — ABNORMAL LOW (ref 11.1–15.9)
MCH: 30.6 pg (ref 26.6–33.0)
MCHC: 32.2 g/dL (ref 31.5–35.7)
MCV: 95 fL (ref 79–97)
Platelets: 188 10*3/uL (ref 150–450)
RBC: 3.53 x10E6/uL — ABNORMAL LOW (ref 3.77–5.28)
RDW: 12.2 % (ref 11.7–15.4)
WBC: 4.4 10*3/uL (ref 3.4–10.8)

## 2024-02-21 LAB — GLUCOSE TOLERANCE, 2 HOURS W/ 1HR
Glucose, 1 hour: 85 mg/dL (ref 70–179)
Glucose, 2 hour: 92 mg/dL (ref 70–152)
Glucose, Fasting: 76 mg/dL (ref 70–91)

## 2024-02-21 LAB — RPR: RPR Ser Ql: NONREACTIVE

## 2024-02-21 LAB — HIV ANTIBODY (ROUTINE TESTING W REFLEX): HIV Screen 4th Generation wRfx: NONREACTIVE

## 2024-02-24 ENCOUNTER — Other Ambulatory Visit: Payer: Self-pay | Admitting: Obstetrics and Gynecology

## 2024-02-24 MED ORDER — FERROUS SULFATE 325 (65 FE) MG PO TABS: 325.0000 mg | ORAL_TABLET | ORAL | 1 refills | Status: DC

## 2024-02-25 ENCOUNTER — Other Ambulatory Visit: Payer: Self-pay | Admitting: *Deleted

## 2024-02-25 ENCOUNTER — Ambulatory Visit: Attending: Maternal & Fetal Medicine | Admitting: Obstetrics

## 2024-02-25 ENCOUNTER — Ambulatory Visit (HOSPITAL_BASED_OUTPATIENT_CLINIC_OR_DEPARTMENT_OTHER)

## 2024-02-25 VITALS — BP 115/56 | HR 81

## 2024-02-25 DIAGNOSIS — O365930 Maternal care for other known or suspected poor fetal growth, third trimester, not applicable or unspecified: Secondary | ICD-10-CM | POA: Insufficient documentation

## 2024-02-25 DIAGNOSIS — Z3A28 28 weeks gestation of pregnancy: Secondary | ICD-10-CM

## 2024-02-25 DIAGNOSIS — Z3480 Encounter for supervision of other normal pregnancy, unspecified trimester: Secondary | ICD-10-CM

## 2024-02-25 NOTE — Procedures (Signed)
 Erika Klein 08/27/1996 [redacted]w[redacted]d  Fetus A Non-Stress Test Interpretation for 02/25/24 (NST only)  Indication: IUGR  Fetal Heart Rate A Mode: External Baseline Rate (A): 135 bpm Variability: Moderate Accelerations: 10 x 10, 15 x 15 Decelerations: None Multiple birth?: No  Uterine Activity Mode: Palpation, Toco Contraction Frequency (min): None Resting Tone Palpated: Relaxed Resting Time: Adequate  Interpretation (Fetal Testing) Nonstress Test Interpretation: Reactive Comments: Dr. Grayland Le reviewed tracing.

## 2024-02-27 ENCOUNTER — Encounter: Admitting: Advanced Practice Midwife

## 2024-03-01 NOTE — Progress Notes (Unsigned)
   PRENATAL VISIT NOTE  Subjective:  Erika Klein is a 28 y.o. [id number] at [redacted]w[redacted]d being seen today for ongoing prenatal care.  She is currently monitored for the following issues for this {Blank single:19197::"high-risk","low-risk"} pregnancy and has Anxiety; Depression; LGSIL on Pap smear of cervix; Obesity affecting pregnancy; Supervision of other normal pregnancy, antepartum; Unwanted fertility; Alpha thalassemia silent carrier; Abnormal findings of gastrointestinal tract; Constipation; Pelvic pain; Fecal smearing; Urinary frequency; Poor fetal growth affecting management of mother in second trimester; and Polyhydramnios in second trimester, antepartum complication on their problem list.  Patient reports {sx:14538}.   .  .   . Denies leaking of fluid.   The following portions of the patient's history were reviewed and updated as appropriate: allergies, current medications, past family history, past medical history, past social history, past surgical history and problem list.   Objective:  There were no vitals filed for this visit.  Fetal Status:           General:  Alert, oriented and cooperative. Patient is in no acute distress.  Skin: Skin is warm and dry. No rash noted.   Cardiovascular: Normal heart rate noted  Respiratory: Normal respiratory effort, no problems with respiration noted  Abdomen: Soft, gravid, appropriate for gestational age.        Pelvic: {Blank single:19197::"Cervical exam performed in the presence of a chaperone","Cervical exam deferred"}        Extremities: Normal range of motion.     Mental Status: Normal mood and affect. Normal behavior. Normal judgment and thought content.   Assessment and Plan:  Pregnancy: [id number] at [redacted]w[redacted]d 1. Supervision of other normal pregnancy, antepartum (Primary) ***  2. [redacted] weeks gestation of pregnancy ***  3. Other depression ***  4. Unwanted fertility ***  5. Polyhydramnios affecting pregnancy in third trimester 6. Fetal  growth restriction antepartum - Has regular MFM follow up  {Blank single:19197::"Term","Preterm"} labor symptoms and general obstetric precautions including but not limited to vaginal bleeding, contractions, leaking of fluid and fetal movement were reviewed in detail with the patient. Please refer to After Visit Summary for other counseling recommendations.   No follow-ups on file.  Future Appointments  Date Time Provider Department Center  03/03/2024  1:15 PM Curlie Doughty [facility]  03/06/2024  7:00 AM [facility] PROVIDER 1 [facility]  03/06/2024  7:30 AM [facility] US5 [facility]  03/06/2024  8:45 AM [facility] NST [facility]  03/11/2024 10:00 AM [facility] PROVIDER 1 [facility]  03/11/2024 10:30 AM [facility] US6 [facility]  03/18/2024 10:15 AM [facility] PROVIDER 1 [facility]  03/18/2024 10:45 AM [facility] NST [facility]  03/18/2024 11:30 AM [facility] US3 [facility]  03/25/2024  3:00 PM [facility] PROVIDER 1 [facility]  03/25/2024  3:30 PM [facility] US5 [facility]  05/14/2024  3:00 PM Darlene Ehlers, MD [facility]  05/26/2024  4:00 PM Bernestine Brighter, PT [facility]    Salomon Cree, CNM

## 2024-03-03 ENCOUNTER — Other Ambulatory Visit: Payer: Self-pay

## 2024-03-03 ENCOUNTER — Other Ambulatory Visit (HOSPITAL_COMMUNITY)
Admission: RE | Admit: 2024-03-03 | Discharge: 2024-03-03 | Disposition: A | Discharge status: Home / Self Care | Source: Ambulatory Visit | Attending: Certified Nurse Midwife | Admitting: Certified Nurse Midwife

## 2024-03-03 ENCOUNTER — Ambulatory Visit: Admitting: Certified Nurse Midwife

## 2024-03-03 VITALS — BP 114/75 | HR 94 | Wt 242.8 lb

## 2024-03-03 DIAGNOSIS — D509 Iron deficiency anemia, unspecified: Secondary | ICD-10-CM

## 2024-03-03 DIAGNOSIS — O403XX0 Polyhydramnios, third trimester, not applicable or unspecified: Secondary | ICD-10-CM | POA: Diagnosis not present

## 2024-03-03 DIAGNOSIS — N898 Other specified noninflammatory disorders of vagina: Secondary | ICD-10-CM | POA: Diagnosis present

## 2024-03-03 DIAGNOSIS — Z3A29 29 weeks gestation of pregnancy: Secondary | ICD-10-CM

## 2024-03-03 DIAGNOSIS — Z3009 Encounter for other general counseling and advice on contraception: Secondary | ICD-10-CM

## 2024-03-03 DIAGNOSIS — O365990 Maternal care for other known or suspected poor fetal growth, unspecified trimester, not applicable or unspecified: Secondary | ICD-10-CM

## 2024-03-03 DIAGNOSIS — Z3480 Encounter for supervision of other normal pregnancy, unspecified trimester: Secondary | ICD-10-CM

## 2024-03-03 DIAGNOSIS — F3289 Other specified depressive episodes: Secondary | ICD-10-CM

## 2024-03-03 DIAGNOSIS — O365930 Maternal care for other known or suspected poor fetal growth, third trimester, not applicable or unspecified: Secondary | ICD-10-CM

## 2024-03-03 MED ORDER — ACCRUFER 30 MG PO CAPS: 1.0000 | ORAL_CAPSULE | Freq: Every day | ORAL | 5 refills | Status: DC

## 2024-03-04 ENCOUNTER — Encounter: Payer: Self-pay | Admitting: Certified Nurse Midwife

## 2024-03-04 LAB — CERVICOVAGINAL ANCILLARY ONLY
Bacterial Vaginitis (gardnerella): NEGATIVE
Candida Glabrata: NEGATIVE
Candida Vaginitis: NEGATIVE
Comment: NEGATIVE
Comment: NEGATIVE
Comment: NEGATIVE

## 2024-03-06 ENCOUNTER — Ambulatory Visit: Attending: Obstetrics

## 2024-03-06 ENCOUNTER — Ambulatory Visit (HOSPITAL_BASED_OUTPATIENT_CLINIC_OR_DEPARTMENT_OTHER): Admitting: Obstetrics

## 2024-03-06 ENCOUNTER — Ambulatory Visit (HOSPITAL_BASED_OUTPATIENT_CLINIC_OR_DEPARTMENT_OTHER): Admitting: *Deleted

## 2024-03-06 VITALS — BP 115/55

## 2024-03-06 DIAGNOSIS — O365920 Maternal care for other known or suspected poor fetal growth, second trimester, not applicable or unspecified: Secondary | ICD-10-CM | POA: Diagnosis present

## 2024-03-06 DIAGNOSIS — O365930 Maternal care for other known or suspected poor fetal growth, third trimester, not applicable or unspecified: Secondary | ICD-10-CM

## 2024-03-06 DIAGNOSIS — O99213 Obesity complicating pregnancy, third trimester: Secondary | ICD-10-CM | POA: Diagnosis present

## 2024-03-06 DIAGNOSIS — E669 Obesity, unspecified: Secondary | ICD-10-CM

## 2024-03-06 DIAGNOSIS — O99013 Anemia complicating pregnancy, third trimester: Secondary | ICD-10-CM

## 2024-03-06 DIAGNOSIS — O402XX0 Polyhydramnios, second trimester, not applicable or unspecified: Secondary | ICD-10-CM | POA: Insufficient documentation

## 2024-03-06 DIAGNOSIS — O403XX0 Polyhydramnios, third trimester, not applicable or unspecified: Secondary | ICD-10-CM

## 2024-03-06 DIAGNOSIS — Z3A30 30 weeks gestation of pregnancy: Secondary | ICD-10-CM | POA: Insufficient documentation

## 2024-03-06 DIAGNOSIS — O409XX0 Polyhydramnios, unspecified trimester, not applicable or unspecified: Secondary | ICD-10-CM | POA: Diagnosis present

## 2024-03-06 DIAGNOSIS — D563 Thalassemia minor: Secondary | ICD-10-CM

## 2024-03-06 NOTE — Progress Notes (Signed)
 MFM Consult Note  Erika Klein is currently at 30 weeks and 1 day.  She has been followed due to IUGR.  Polyhydramnios was also noted during her last ultrasound exam.    She denies any problems since her last exam and reports feeling vigorous fetal movements throughout the day.  She had a reactive nonstress test today.  There was normal amniotic fluid with a total AFI of 18.89 cm.  Doppler studies of the umbilical arteries performed due to fetal growth restriction showed a normal S/D ratio of 3.19.  There were no signs of absent or reversed end-diastolic flow noted today.  Due to IUGR, we will continue to follow her with weekly fetal testing until delivery.    We will reassess the fetal growth next week.  Should IUGR continue to be noted later in her pregnancy, delivery will be recommended at between 37 to 38 weeks.  She will return in 1 week for an NST, growth scan, and umbilical artery Doppler study.     A total of 10 minutes was spent counseling and coordinating the care for this patient.  Greater than 50% of the time was spent in direct face-to-face contact.

## 2024-03-06 NOTE — Procedures (Signed)
 Erika Klein Feb 23, 1996 [redacted]w[redacted]d  Fetus A Non-Stress Test Interpretation for 03/06/24-NST with UAD  Indication: IUGR and Polyhydramnios  Fetal Heart Rate A Mode: External Baseline Rate (A): 135 bpm Variability: Moderate Accelerations: 15 x 15 Decelerations: None Multiple birth?: No  Uterine Activity Mode: Toco Contraction Frequency (min): none Resting Tone Palpated: Relaxed  Interpretation (Fetal Testing) Nonstress Test Interpretation: Reactive Comments: Tracing reviewed byDr. Grayland Le

## 2024-03-11 ENCOUNTER — Ambulatory Visit: Attending: Maternal & Fetal Medicine

## 2024-03-11 ENCOUNTER — Ambulatory Visit (HOSPITAL_BASED_OUTPATIENT_CLINIC_OR_DEPARTMENT_OTHER): Admitting: *Deleted

## 2024-03-11 ENCOUNTER — Ambulatory Visit (HOSPITAL_BASED_OUTPATIENT_CLINIC_OR_DEPARTMENT_OTHER): Admitting: Obstetrics

## 2024-03-11 ENCOUNTER — Other Ambulatory Visit: Payer: Self-pay | Admitting: *Deleted

## 2024-03-11 VITALS — BP 112/60 | HR 76

## 2024-03-11 DIAGNOSIS — E669 Obesity, unspecified: Secondary | ICD-10-CM

## 2024-03-11 DIAGNOSIS — Z3A30 30 weeks gestation of pregnancy: Secondary | ICD-10-CM | POA: Insufficient documentation

## 2024-03-11 DIAGNOSIS — O365920 Maternal care for other known or suspected poor fetal growth, second trimester, not applicable or unspecified: Secondary | ICD-10-CM

## 2024-03-11 DIAGNOSIS — O365930 Maternal care for other known or suspected poor fetal growth, third trimester, not applicable or unspecified: Secondary | ICD-10-CM | POA: Diagnosis present

## 2024-03-11 DIAGNOSIS — O365990 Maternal care for other known or suspected poor fetal growth, unspecified trimester, not applicable or unspecified: Secondary | ICD-10-CM | POA: Insufficient documentation

## 2024-03-11 DIAGNOSIS — O99013 Anemia complicating pregnancy, third trimester: Secondary | ICD-10-CM

## 2024-03-11 DIAGNOSIS — D563 Thalassemia minor: Secondary | ICD-10-CM

## 2024-03-11 DIAGNOSIS — O403XX0 Polyhydramnios, third trimester, not applicable or unspecified: Secondary | ICD-10-CM

## 2024-03-11 DIAGNOSIS — O99213 Obesity complicating pregnancy, third trimester: Secondary | ICD-10-CM

## 2024-03-11 NOTE — Progress Notes (Signed)
 MFM Consult Note  Erika Klein is currently at 30 weeks and 6 days.  She has been followed due to IUGR.  Polyhydramnios was also noted during her prior ultrasound exams.    She denies any problems since her last exam and reports feeling vigorous fetal movements throughout the day.  On today's exam, the overall EFW measures at 2 pounds 14 ounces (3rd percentile for her gestational age).  The fetus grew about 12 ounces over the past 3 weeks.  She had a reactive nonstress test today.    Fetal movements and fetal breathing movements were noted throughout today's ultrasound exam.  Polyhydramnios with a 9.78 cm maximal vertical pocket was noted today.  Doppler studies of the umbilical arteries performed due to fetal growth restriction showed a normal S/D ratio of 2.98.  There were no signs of absent or reversed end-diastolic flow noted today.  Due to IUGR, we will continue to follow her with weekly fetal testing until delivery.    Should IUGR continue to be noted later in her pregnancy, delivery will be recommended at between 37 to 38 weeks.  She will return in 1 week for an NST and umbilical artery Doppler study.     A total of 10 minutes was spent counseling and coordinating the care for this patient.  Greater than 50% of the time was spent in direct face-to-face contact.

## 2024-03-11 NOTE — Procedures (Signed)
 Erika Klein [birth year] [redacted]w[redacted]d  Fetus A Non-Stress Test Interpretation for 05/07/25NST with UAD  Indication: IUGR and Polyhydramnios  Fetal Heart Rate A Mode: External Baseline Rate (A): 140 bpm Variability: Moderate Accelerations: 10 x 10 Decelerations: None Multiple birth?: No  Uterine Activity Mode: Toco Contraction Frequency (min): none Resting Tone Palpated: Relaxed  Interpretation (Fetal Testing) Nonstress Test Interpretation: Reactive Comments: Tracing reviewed by Dr. Grayland Le

## 2024-03-18 ENCOUNTER — Other Ambulatory Visit: Payer: Self-pay | Admitting: Obstetrics

## 2024-03-18 ENCOUNTER — Ambulatory Visit: Attending: Obstetrics | Admitting: Obstetrics and Gynecology

## 2024-03-18 ENCOUNTER — Ambulatory Visit: Admitting: *Deleted

## 2024-03-18 ENCOUNTER — Ambulatory Visit

## 2024-03-18 DIAGNOSIS — E669 Obesity, unspecified: Secondary | ICD-10-CM | POA: Diagnosis not present

## 2024-03-18 DIAGNOSIS — O365930 Maternal care for other known or suspected poor fetal growth, third trimester, not applicable or unspecified: Secondary | ICD-10-CM

## 2024-03-18 DIAGNOSIS — O99213 Obesity complicating pregnancy, third trimester: Secondary | ICD-10-CM | POA: Insufficient documentation

## 2024-03-18 DIAGNOSIS — O403XX0 Polyhydramnios, third trimester, not applicable or unspecified: Secondary | ICD-10-CM

## 2024-03-18 DIAGNOSIS — Z3A31 31 weeks gestation of pregnancy: Secondary | ICD-10-CM | POA: Diagnosis not present

## 2024-03-18 DIAGNOSIS — O365920 Maternal care for other known or suspected poor fetal growth, second trimester, not applicable or unspecified: Secondary | ICD-10-CM

## 2024-03-18 DIAGNOSIS — Z148 Genetic carrier of other disease: Secondary | ICD-10-CM | POA: Diagnosis not present

## 2024-03-18 DIAGNOSIS — O99013 Anemia complicating pregnancy, third trimester: Secondary | ICD-10-CM

## 2024-03-18 DIAGNOSIS — O402XX0 Polyhydramnios, second trimester, not applicable or unspecified: Secondary | ICD-10-CM

## 2024-03-18 DIAGNOSIS — Z362 Encounter for other antenatal screening follow-up: Secondary | ICD-10-CM | POA: Insufficient documentation

## 2024-03-18 DIAGNOSIS — D649 Anemia, unspecified: Secondary | ICD-10-CM

## 2024-03-18 NOTE — Progress Notes (Signed)
 After review, MFM consult with provider is not indicated for today  Cassandria Clever, MD 03/18/2024 12:37 PM  [facility]

## 2024-03-18 NOTE — Procedures (Signed)
 Erika Klein 10/04/1996 [redacted]w[redacted]d  Fetus A Non-Stress Test Interpretation for 05/14/25NST with UAD, BPP & limited  Indication: IUGR and Polyhydramnios  Fetal Heart Rate A Mode: External Baseline Rate (A): 145 bpm Variability: Moderate Accelerations: 15 x 15, 10 x 10 Decelerations: None Multiple birth?: No  Uterine Activity Mode: Toco Contraction Frequency (min): none Resting Tone Palpated: Relaxed  Interpretation (Fetal Testing) Nonstress Test Interpretation: Reactive Comments: Tracing reviewed bydr. Nolan Battle

## 2024-03-19 ENCOUNTER — Other Ambulatory Visit: Payer: Self-pay

## 2024-03-19 ENCOUNTER — Ambulatory Visit: Admitting: Obstetrics and Gynecology

## 2024-03-19 VITALS — BP 104/70 | HR 80 | Wt 243.0 lb

## 2024-03-19 DIAGNOSIS — O99213 Obesity complicating pregnancy, third trimester: Secondary | ICD-10-CM | POA: Diagnosis not present

## 2024-03-19 DIAGNOSIS — D563 Thalassemia minor: Secondary | ICD-10-CM | POA: Diagnosis not present

## 2024-03-19 DIAGNOSIS — Z3A32 32 weeks gestation of pregnancy: Secondary | ICD-10-CM | POA: Diagnosis not present

## 2024-03-19 DIAGNOSIS — Z6841 Body Mass Index (BMI) 40.0 and over, adult: Secondary | ICD-10-CM

## 2024-03-19 DIAGNOSIS — O403XX0 Polyhydramnios, third trimester, not applicable or unspecified: Secondary | ICD-10-CM

## 2024-03-19 DIAGNOSIS — O365930 Maternal care for other known or suspected poor fetal growth, third trimester, not applicable or unspecified: Secondary | ICD-10-CM

## 2024-03-19 DIAGNOSIS — O402XX0 Polyhydramnios, second trimester, not applicable or unspecified: Secondary | ICD-10-CM

## 2024-03-19 NOTE — Progress Notes (Signed)
   PRENATAL VISIT NOTE  Subjective:  Erika Klein is a 28 y.o. [id number] at [redacted]w[redacted]d being seen today for ongoing prenatal care.  She is currently monitored for the following issues for this high-risk pregnancy and has Anxiety; Depression; LGSIL on Pap smear of cervix; Obesity affecting pregnancy; Supervision of other normal pregnancy, antepartum; Unwanted fertility; Alpha thalassemia silent carrier; Abnormal findings of gastrointestinal tract; Constipation; Pelvic pain; Fecal smearing; Urinary frequency; Poor fetal growth affecting management of mother in second trimester; and Polyhydramnios in second trimester, antepartum complication on their problem list.  Patient reports no complaints.  Contractions: Regular. Vag. Bleeding: None.  Movement: Present. Denies leaking of fluid.   The following portions of the patient's history were reviewed and updated as appropriate: allergies, current medications, past family history, past medical history, past social history, past surgical history and problem list.   Objective:    Vitals:   03/19/24 1132  BP: 104/70  Pulse: 80  Weight: 243 lb (110.2 kg)    Fetal Status:  Fetal Heart Rate (bpm): 143   Movement: Present    General: Alert, oriented and cooperative. Patient is in no acute distress.  Skin: Skin is warm and dry. No rash noted.   Cardiovascular: Normal heart rate noted  Respiratory: Normal respiratory effort, no problems with respiration noted  Abdomen: Soft, gravid, appropriate for gestational age.  Pain/Pressure: Present     Pelvic: Cervical exam deferred        Extremities: Normal range of motion.  Edema: Trace  Mental Status: Normal mood and affect. Normal behavior. Normal judgment and thought content.   Assessment and Plan:  Pregnancy: [id number] at [redacted]w[redacted]d 1. BMI 40.0-44.9, adult (HCC) (Primary) Weight stable  2. Obesity affecting pregnancy in third trimester, unspecified obesity type  3. [redacted] weeks gestation of pregnancy BTL papers  signed 4/10  4. Alpha thalassemia silent carrier  5. Poor fetal growth affecting management of mother in third trimester, single or unspecified fetus BPP 10/10, with mild poly yesterday with AFI 25, MVP 8.1 with normal UA dopplers, breech 5/7: efw 2.8%, 1305g, ac 4.1%, mild poly, UA wnl  6. Polyhydramnios in second trimester, single or unspecified fetus Normal 2h GTT, low risk panorama  Preterm labor symptoms and general obstetric precautions including but not limited to vaginal bleeding, contractions, leaking of fluid and fetal movement were reviewed in detail with the patient. Please refer to After Visit Summary for other counseling recommendations.   No follow-ups on file.  Future Appointments  Date Time Provider Department Center  03/25/2024  3:00 PM [facility] PROVIDER 1 [facility]  03/25/2024  3:30 PM [facility] US5 [facility]  04/06/2024  2:15 PM Ebony Goldstein, MD [facility]  04/07/2024  3:55 PM Jan Mcgill, MD [facility]  04/22/2024  3:15 PM Darrow End, MD [facility]  04/30/2024  1:15 PM Tresia Fruit, MD [facility]  05/14/2024  3:00 PM Darlene Ehlers, MD [facility]  05/26/2024  4:00 PM Bernestine Brighter, PT [facility]    Raynell Caller, MD

## 2024-03-20 DIAGNOSIS — O283 Abnormal ultrasonic finding on antenatal screening of mother: Secondary | ICD-10-CM | POA: Insufficient documentation

## 2024-03-24 ENCOUNTER — Inpatient Hospital Stay (HOSPITAL_COMMUNITY)
Admission: AD | Admit: 2024-03-24 | Discharge: 2024-03-24 | Disposition: A | Discharge status: Home / Self Care | Attending: Obstetrics and Gynecology | Admitting: Obstetrics and Gynecology

## 2024-03-24 ENCOUNTER — Encounter (HOSPITAL_COMMUNITY): Payer: Self-pay | Admitting: Obstetrics and Gynecology

## 2024-03-24 ENCOUNTER — Other Ambulatory Visit: Payer: Self-pay

## 2024-03-24 DIAGNOSIS — B9689 Other specified bacterial agents as the cause of diseases classified elsewhere: Secondary | ICD-10-CM

## 2024-03-24 DIAGNOSIS — O23593 Infection of other part of genital tract in pregnancy, third trimester: Secondary | ICD-10-CM | POA: Diagnosis not present

## 2024-03-24 DIAGNOSIS — Z3A32 32 weeks gestation of pregnancy: Secondary | ICD-10-CM | POA: Diagnosis not present

## 2024-03-24 DIAGNOSIS — N760 Acute vaginitis: Secondary | ICD-10-CM

## 2024-03-24 DIAGNOSIS — R102 Pelvic and perineal pain: Secondary | ICD-10-CM | POA: Diagnosis not present

## 2024-03-24 DIAGNOSIS — O26893 Other specified pregnancy related conditions, third trimester: Secondary | ICD-10-CM

## 2024-03-24 DIAGNOSIS — O26899 Other specified pregnancy related conditions, unspecified trimester: Secondary | ICD-10-CM

## 2024-03-24 LAB — URINALYSIS, ROUTINE W REFLEX MICROSCOPIC
Bilirubin Urine: NEGATIVE
Glucose, UA: NEGATIVE mg/dL
Hgb urine dipstick: NEGATIVE
Ketones, ur: NEGATIVE mg/dL
Nitrite: NEGATIVE
Protein, ur: NEGATIVE mg/dL
Specific Gravity, Urine: 1.008 (ref 1.005–1.030)
pH: 5 (ref 5.0–8.0)

## 2024-03-24 LAB — WET PREP, GENITAL
Sperm: NONE SEEN
Trich, Wet Prep: NONE SEEN
WBC, Wet Prep HPF POC: >=10 — AB (ref <10)
Yeast Wet Prep HPF POC: NONE SEEN

## 2024-03-24 MED ORDER — METRONIDAZOLE 500 MG PO TABS: 500.0000 mg | ORAL_TABLET | Freq: Two times a day (BID) | ORAL | 0 refills | Status: DC

## 2024-03-24 NOTE — MAU Note (Signed)
 Erika Klein is a 28 y.o. at [redacted]w[redacted]d here in MAU reporting: constant pelvic pressure since last night. Denies VB or LOF. Says hasn't felt baby move yet today but she just woke up.    Pain score: 7/10 Vitals:   03/24/24 0831  BP: 125/64  Pulse: 87  Resp: 18  Temp: 98.3 F (36.8 C)  SpO2: 99%     FHT: 148  Lab orders placed from triage: UA

## 2024-03-24 NOTE — MAU Provider Note (Signed)
 Chief Complaint:  Pelvic Pain   HPI   Event Date/Time   First Provider Initiated Contact with Patient 03/24/24 0857      Erika Klein is a 28 y.o. [id number] at [redacted]w[redacted]d who presents to maternity admissions reporting c/o increased pelvic pressure and rectal pressure. No alleviating symptoms reports sometimes feels more pressure when she is walking or standing. Denies VB, LOF, CTX, and reports fetal movements are present. 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 IUGR  1 SAB 09/25/17           Past Surgical History:  Procedure Laterality Date   NO PAST SURGERIES     Family History  Problem Relation Age of Onset   Schizophrenia Mother    Bipolar disorder Mother    Anxiety disorder Mother    Uterine cancer Mother    Anxiety disorder Father    Asthma Father    Hypertension Maternal Grandmother    Stroke Paternal Grandmother    Hypertension Paternal Grandmother    Hypertension Maternal Aunt    Diabetes Neg Hx    Heart disease Neg Hx    Bladder Cancer Neg Hx    Renal cancer Neg Hx    Social History   Tobacco Use   Smoking status: Never   Smokeless tobacco: Never  Vaping Use   Vaping status: Never Used  Substance Use Topics   Alcohol use: Not Currently   Drug use: Never   No Known Allergies Medications Prior to Admission   Medication Sig Dispense Refill Last Dose/Taking   ferrous sulfate  325 (65 FE) MG tablet Take 1 tablet (325 mg total) by mouth every other day. 90 tablet 1 Past Week   aspirin  EC 81 MG tablet Take 1 tablet (81 mg total) by mouth daily. Start taking when you are [redacted] weeks pregnant for rest of pregnancy for prevention of preeclampsia 300 tablet 2    Ferric Maltol  (ACCRUFER ) 30 MG CAPS Take 1 capsule (30 mg total) by mouth daily. 30 capsule 5    lidocaine  (XYLOCAINE ) 5 % ointment Apply 0.5g peasize at vaginal opening up to 3 times a day as needed for comfort (Patient not taking: Reported on 02/25/2024) 35.44 g 0    Prenatal MV & Min w/FA-DHA (PRENATAL GUMMIES) 0.18-25 MG CHEW Take 2 gummies by mouth every day 60 tablet 4     I have reviewed patient's Past Medical Hx, Surgical Hx, Family Hx, Social Hx, medications and allergies.   ROS  Pertinent items noted in HPI and remainder of  comprehensive ROS otherwise negative.   PHYSICAL EXAM   Patient Vitals for the past 24 hrs:  BP Temp Temp src Pulse Resp SpO2 Height Weight  03/24/24 0831 125/64 98.3 F (36.8 C) Oral 87 18 99 % 5\' 4"  (1.626 m) 114.4 kg    Constitutional: Well-developed, obese  female in no acute distress.  Cardiovascular: normal rate & rhythm, warm and well-perfused Respiratory: normal effort, no problems with respiration noted GI: Abd soft, non-tender, gravid MS: Extremities nontender, no edema, normal ROM Neurologic: Alert and oriented x 4.  GU: no CVA tenderness Pelvic: exam chaperoned by Heather  Price RN Speculum exam: C/W heavy yellowish discharge, no evidence of pooling, no vaginal bleeding in the vault, and cervix is visually closed. SVE: L/C/P  Dilation: Closed  Fetal Tracing: Cat 1 reactive Baseline: 135 Variability: moderate Accelerations: present Decelerations: absent Toco: quite   Labs: Results for orders placed or performed during the hospital encounter of 03/24/24 (from the past 24 hours)  Wet prep,  genital     Status: Abnormal   Collection Time: 03/24/24  9:13 AM  Result Value Ref Range   Yeast Wet Prep HPF POC NONE SEEN NONE SEEN   Trich, Wet Prep NONE SEEN NONE SEEN   Clue Cells Wet Prep HPF POC PRESENT (A) NONE SEEN   WBC, Wet Prep HPF POC >=10 (A) <10   Sperm NONE SEEN     Imaging:  No results found.  MDM & MAU COURSE  MDM:  HIGH  - R/O PTL/Infectious process - Pelvic exam performed - Swabs ordered for increased vaginal discharge ( Wet prep confirms BV- RX sent at discharge) - U/A ordered for pelvic pressure - NST for GA   MAU Course: Orders Placed This Encounter  Procedures   Wet prep, genital   Urinalysis, Routine w reflex microscopic -Urine, Random   Discharge patient Discharge disposition: 01-Home or Self Care; Discharge patient date: 03/24/2024    I have reviewed the patient chart and performed the physical exam . I have ordered & interpreted the lab results and reviewed and interpreted the NST Medications ordered as stated below.  A/P as described below.  Counseling and education provided and patient agreeable  with plan as described below. Verbalized understanding.    ASSESSMENT   1. Bacterial vaginosis   2. [redacted] weeks gestation of pregnancy   3. Pelvic pressure in pregnancy, antepartum     PLAN  Discharge home in stable condition with return precautions.   See AVS for full description of information given to the patient including both verbal and written. Patient verbalized understanding and agrees with the plan as described above.  Future Appointments  Date Time Provider Department Center  03/25/2024  3:00 PM [facility] PROVIDER 1 [facility]  03/25/2024  3:30 PM [facility] US5 [facility]  04/07/2024  3:55 PM Jan Mcgill, MD [facility]  04/22/2024  3:15 PM Darrow End, MD [facility]  04/30/2024  1:15 PM Tresia Fruit, MD [facility]  05/14/2024  3:00 PM Darlene Ehlers, MD [facility]  05/26/2024  4:00 PM Bernestine Brighter, PT [facility]       Follow-up Information     Center for [facility] Follow up.   Specialty: Obstetrics and Gynecology Why: If symptoms worsen or fail to resolve, As scheduled for ongoing prenatal care Contact information: [address] [place]  [phone]  Allergies as of 03/24/2024   No Known Allergies      Medication List     TAKE these medications    ACCRUFeR  30 MG Caps Generic drug: Ferric Maltol  Take 1 capsule (30 mg total) by mouth daily.   aspirin  EC 81 MG tablet Take 1 tablet (81 mg total) by mouth daily. Start taking when you are [redacted] weeks pregnant for rest of pregnancy for prevention of preeclampsia   ferrous sulfate  325 (65 FE) MG tablet Take 1 tablet (325 mg total) by mouth every other day.   lidocaine  5 % ointment Commonly known as: XYLOCAINE  Apply 0.5g peasize at vaginal opening up to 3 times a day as needed for comfort   metroNIDAZOLE  500 MG tablet Commonly known as: FLAGYL  Take 1 tablet (500 mg total) by mouth 2 (two) times daily.   Prenatal Gummies 0.18-25 MG Chew Take 2 gummies by mouth every day        Debbe Fail, MSN, [facility], Center for [employer]

## 2024-03-25 ENCOUNTER — Ambulatory Visit: Attending: Obstetrics

## 2024-03-25 ENCOUNTER — Ambulatory Visit: Admitting: *Deleted

## 2024-03-25 ENCOUNTER — Other Ambulatory Visit: Payer: Self-pay | Admitting: Obstetrics

## 2024-03-25 ENCOUNTER — Ambulatory Visit (HOSPITAL_BASED_OUTPATIENT_CLINIC_OR_DEPARTMENT_OTHER): Admitting: Maternal & Fetal Medicine

## 2024-03-25 ENCOUNTER — Inpatient Hospital Stay (HOSPITAL_COMMUNITY)
Admission: AD | Admit: 2024-03-25 | Discharge: 2024-03-25 | Disposition: A | Discharge status: Home / Self Care | Attending: Obstetrics and Gynecology | Admitting: Obstetrics and Gynecology

## 2024-03-25 ENCOUNTER — Ambulatory Visit: Payer: Self-pay | Admitting: Nurse Practitioner

## 2024-03-25 VITALS — BP 107/64 | HR 85

## 2024-03-25 DIAGNOSIS — Z3A32 32 weeks gestation of pregnancy: Secondary | ICD-10-CM

## 2024-03-25 DIAGNOSIS — O365930 Maternal care for other known or suspected poor fetal growth, third trimester, not applicable or unspecified: Secondary | ICD-10-CM

## 2024-03-25 DIAGNOSIS — Z3480 Encounter for supervision of other normal pregnancy, unspecified trimester: Secondary | ICD-10-CM | POA: Diagnosis present

## 2024-03-25 DIAGNOSIS — O98213 Gonorrhea complicating pregnancy, third trimester: Secondary | ICD-10-CM

## 2024-03-25 DIAGNOSIS — E669 Obesity, unspecified: Secondary | ICD-10-CM | POA: Diagnosis not present

## 2024-03-25 DIAGNOSIS — A749 Chlamydial infection, unspecified: Secondary | ICD-10-CM | POA: Diagnosis not present

## 2024-03-25 DIAGNOSIS — O98813 Other maternal infectious and parasitic diseases complicating pregnancy, third trimester: Secondary | ICD-10-CM | POA: Diagnosis not present

## 2024-03-25 DIAGNOSIS — O99213 Obesity complicating pregnancy, third trimester: Secondary | ICD-10-CM

## 2024-03-25 DIAGNOSIS — O99013 Anemia complicating pregnancy, third trimester: Secondary | ICD-10-CM

## 2024-03-25 DIAGNOSIS — O403XX0 Polyhydramnios, third trimester, not applicable or unspecified: Secondary | ICD-10-CM | POA: Diagnosis not present

## 2024-03-25 DIAGNOSIS — D563 Thalassemia minor: Secondary | ICD-10-CM

## 2024-03-25 LAB — GC/CHLAMYDIA PROBE AMP (~~LOC~~) NOT AT ARMC
Chlamydia: POSITIVE — AB
Comment: NEGATIVE
Comment: NORMAL
Neisseria Gonorrhea: POSITIVE — AB

## 2024-03-25 MED ORDER — AZITHROMYCIN 250 MG PO TABS
1000.0000 mg | ORAL_TABLET | Freq: Once | ORAL | Status: AC
  Administered 2024-03-25: 1000 mg via ORAL
  Filled 2024-03-25: qty 4

## 2024-03-25 MED ORDER — CEFTRIAXONE SODIUM 500 MG IJ SOLR
500.0000 mg | Freq: Once | INTRAMUSCULAR | Status: AC
  Administered 2024-03-25: 500 mg via INTRAMUSCULAR
  Filled 2024-03-25: qty 500

## 2024-03-25 MED ORDER — LIDOCAINE HCL (PF) 1 % IJ SOLN
1.0000 mL | Freq: Once | INTRAMUSCULAR | Status: AC
  Administered 2024-03-25: 1 mL
  Filled 2024-03-25: qty 5

## 2024-03-25 NOTE — Progress Notes (Signed)
 After review, MFM consult with provider is not indicated for today  Kristi Petties, MD 03/25/2024 4:39 PM  [facility]

## 2024-03-25 NOTE — MAU Provider Note (Signed)
  S [name] is a 28 y.o. [phone] patient who presents to MAU today after being seen in MAU yesterday for c/o pelvic pain. Her swabs were positive for both Gonorrhea and Chlamydia and the patient reports that she saw the results in her My Chart and is requesting treatment.    O BP 132/72 (BP Location: Right Arm)   Pulse 84   Temp 98.2 F (36.8 C) (Oral)   Resp 17   Ht 5\' 4"  (1.626 m)   Wt 113.4 kg   LMP 07/13/2023   SpO2 100%   BMI 42.91 kg/m  Physical Exam Vitals and nursing note reviewed.  Constitutional:      General: She is not in acute distress.    Appearance: Normal appearance. She is obese. She is not ill-appearing.  HENT:     Nose: Nose normal.     Mouth/Throat:     Mouth: Mucous membranes are moist.  Cardiovascular:     Rate and Rhythm: Normal rate.  Pulmonary:     Effort: Pulmonary effort is normal.  Musculoskeletal:     Cervical back: Normal range of motion.  Skin:    General: Skin is warm.  Neurological:     Mental Status: She is alert and oriented to person, place, and time.  Psychiatric:        Mood and Affect: Affect is flat.     MDM  Moderate   Gonorrhea and Chlamydia Positive in Pregnancy Treatment for STI's per CDC guidelines  Meds ordered this encounter  Medications   cefTRIAXone  (ROCEPHIN ) injection 500 mg    Antibiotic Indication::   STD   azithromycin (ZITHROMAX) tablet 1,000 mg   lidocaine  (PF) (XYLOCAINE ) 1 % injection 1 mL     Patient aware that partner notification and treatment is needed  Counseling and education provided and patient agreeable  with plan as described below. Verbalized understanding.    ASSESSMENT Medical screening exam complete  1. Gonorrhea affecting pregnancy in third trimester (Primary)  2. Chlamydia infection affecting pregnancy in third trimester  3. [redacted] weeks gestation of pregnancy    PLAN Future Appointments  Date Time Provider Department Center  04/01/2024 11:00 AM [facility] PROVIDER 1  [facility]  04/01/2024 11:30 AM [facility] US5 [facility]  04/07/2024  3:55 PM Jan Mcgill, MD [facility]  04/22/2024  7:00 AM [facility] PROVIDER 1 [facility]  04/22/2024  7:30 AM [facility] US5 [facility]  04/22/2024  8:45 AM [facility] NST [facility]  04/22/2024  3:15 PM Darrow End, MD [facility]  04/30/2024  1:15 PM Tresia Fruit, MD [facility]  05/14/2024  3:00 PM Darlene Ehlers, MD [facility]  05/26/2024  4:00 PM Bernestine Brighter, PT [facility]    Discharge from MAU in stable condition  See AVS for full description of educational information and instructions provided to the patient at time of discharge   Warning signs for worsening condition that would warrant emergency follow-up discussed Patient may return to MAU as needed   Cherlynn Cornfield, NP 03/25/2024 8:06 PM

## 2024-03-25 NOTE — MAU Note (Signed)
..  Erika Klein is a 28 y.o. at [redacted]w[redacted]d here in MAU reporting: was here yesterday and was told she had BV, today received mychart result from G/C and tested positive. Is concerned about result.   Denies pain. +FM   Pain score: 0/10 Vitals:   03/25/24 1942  BP: 132/72  Pulse: 84  Resp: 17  Temp: 98.2 F (36.8 C)  SpO2: 100%     FHT:150 Lab orders placed from triage:  none

## 2024-03-25 NOTE — Procedures (Signed)
 Dameka Thoma 1996-07-07 [redacted]w[redacted]d  Fetus A Non-Stress Test Interpretation for 03/25/24-NST with BPP  Indication: IUGR, Polyhydramnios, and    Fetal Heart Rate A Mode: External Baseline Rate (A): 130 bpm Variability: Moderate Accelerations: 15 x 15 Decelerations: None Multiple birth?: No  Uterine Activity Mode: Toco Contraction Frequency (min): none Resting Tone Palpated: Relaxed  Interpretation (Fetal Testing) Nonstress Test Interpretation: Reactive Comments: Tracing reviewed by Dr. Arcola Kocher

## 2024-03-26 ENCOUNTER — Other Ambulatory Visit: Payer: Self-pay

## 2024-03-26 DIAGNOSIS — O99213 Obesity complicating pregnancy, third trimester: Secondary | ICD-10-CM

## 2024-03-26 DIAGNOSIS — O365930 Maternal care for other known or suspected poor fetal growth, third trimester, not applicable or unspecified: Secondary | ICD-10-CM

## 2024-03-27 NOTE — Progress Notes (Signed)
See NST note.

## 2024-03-31 ENCOUNTER — Encounter

## 2024-03-31 ENCOUNTER — Encounter: Payer: Self-pay | Admitting: Physical Therapy

## 2024-04-01 ENCOUNTER — Ambulatory Visit: Attending: Obstetrics and Gynecology | Admitting: Obstetrics

## 2024-04-01 ENCOUNTER — Telehealth: Payer: Self-pay | Admitting: Family Medicine

## 2024-04-01 ENCOUNTER — Ambulatory Visit: Admitting: *Deleted

## 2024-04-01 ENCOUNTER — Other Ambulatory Visit: Payer: Self-pay | Admitting: *Deleted

## 2024-04-01 ENCOUNTER — Ambulatory Visit

## 2024-04-01 ENCOUNTER — Other Ambulatory Visit: Payer: Self-pay | Admitting: Obstetrics

## 2024-04-01 VITALS — BP 112/66 | HR 88

## 2024-04-01 DIAGNOSIS — O403XX0 Polyhydramnios, third trimester, not applicable or unspecified: Secondary | ICD-10-CM | POA: Diagnosis not present

## 2024-04-01 DIAGNOSIS — O365920 Maternal care for other known or suspected poor fetal growth, second trimester, not applicable or unspecified: Secondary | ICD-10-CM | POA: Diagnosis present

## 2024-04-01 DIAGNOSIS — E669 Obesity, unspecified: Secondary | ICD-10-CM

## 2024-04-01 DIAGNOSIS — D563 Thalassemia minor: Secondary | ICD-10-CM

## 2024-04-01 DIAGNOSIS — O99213 Obesity complicating pregnancy, third trimester: Secondary | ICD-10-CM | POA: Diagnosis not present

## 2024-04-01 DIAGNOSIS — Z3A33 33 weeks gestation of pregnancy: Secondary | ICD-10-CM | POA: Insufficient documentation

## 2024-04-01 DIAGNOSIS — O365930 Maternal care for other known or suspected poor fetal growth, third trimester, not applicable or unspecified: Secondary | ICD-10-CM

## 2024-04-01 DIAGNOSIS — O99013 Anemia complicating pregnancy, third trimester: Secondary | ICD-10-CM

## 2024-04-01 NOTE — Telephone Encounter (Signed)
 Returned call to patient. She reports she needs an induction date. She reports she would like to be induced on June 22. Reviewed this will be discussed at her upcoming appointment on June 3. Patient voiced understanding.

## 2024-04-01 NOTE — Telephone Encounter (Signed)
 Patient was told from upstairs come down to our floor and ask for an induction date. She has an appt on 6/3 but patient insisted that a nurse all her back.

## 2024-04-01 NOTE — Progress Notes (Signed)
 MFM Consult Note  Erika Klein is currently at 33 weeks and 6 days.  She has been followed due to IUGR and maternal obesity with a BMI of 43.  She denies any problems since her last exam and reports feeling vigorous fetal movements throughout the day.    On today's exam, the EFW of 4 pounds measures at the 4th percentile for her gestational age indicating IUGR.  The fetus has grown over 1 pound over the past 3 weeks.   The total AFI was 19.46 cm (within normal limits).  A BPP performed today was 10 out of 10 with a reactive NST.  Doppler studies of the umbilical arteries showed a normal S/D ratio of 3.2 .  There were no signs of absent or reversed end-diastolic flow.    Due to fetal growth restriction, we will continue to follow her with weekly fetal testing and umbilical artery Doppler studies.    Due to IUGR, delivery is recommended at between 37 to 38 weeks.  The patient has stated that she would like her induction to be scheduled on April 26, 2024.  That is the date when she will have childcare available.  She will return in 1 week for another BPP/NST and umbilical artery Doppler study.    The patient stated that all of her questions were answered today.  A total of 20 minutes was spent counseling and coordinating the care for this patient.  Greater than 50% of the time was spent in direct face-to-face contact.

## 2024-04-01 NOTE — Procedures (Signed)
 Erika Klein [birth year] [redacted]w[redacted]d  Fetus A Non-Stress Test Interpretation for 04/01/24-NST only  Indication: IUGR and obese  Fetal Heart Rate A Mode: External Baseline Rate (A): 140 bpm Variability: Moderate Accelerations: 15 x 15 Decelerations: None Multiple birth?: No  Uterine Activity Mode: Toco Contraction Frequency (min): none  Interpretation (Fetal Testing) Nonstress Test Interpretation: Reactive Comments: Tracing reivewed byDr. Grayland Le

## 2024-04-02 ENCOUNTER — Encounter: Admitting: Physical Therapy

## 2024-04-06 ENCOUNTER — Encounter: Admitting: Family Medicine

## 2024-04-07 ENCOUNTER — Ambulatory Visit (INDEPENDENT_AMBULATORY_CARE_PROVIDER_SITE_OTHER): Payer: Self-pay | Admitting: Advanced Practice Midwife

## 2024-04-07 ENCOUNTER — Other Ambulatory Visit: Payer: Self-pay

## 2024-04-07 VITALS — BP 125/82 | HR 88 | Wt 245.0 lb

## 2024-04-07 DIAGNOSIS — O402XX0 Polyhydramnios, second trimester, not applicable or unspecified: Secondary | ICD-10-CM

## 2024-04-07 DIAGNOSIS — O403XX0 Polyhydramnios, third trimester, not applicable or unspecified: Secondary | ICD-10-CM | POA: Diagnosis not present

## 2024-04-07 DIAGNOSIS — O365930 Maternal care for other known or suspected poor fetal growth, third trimester, not applicable or unspecified: Secondary | ICD-10-CM | POA: Diagnosis not present

## 2024-04-07 DIAGNOSIS — O283 Abnormal ultrasonic finding on antenatal screening of mother: Secondary | ICD-10-CM | POA: Diagnosis not present

## 2024-04-07 DIAGNOSIS — Z3480 Encounter for supervision of other normal pregnancy, unspecified trimester: Secondary | ICD-10-CM

## 2024-04-07 DIAGNOSIS — Z3A34 34 weeks gestation of pregnancy: Secondary | ICD-10-CM | POA: Diagnosis not present

## 2024-04-07 DIAGNOSIS — Z3009 Encounter for other general counseling and advice on contraception: Secondary | ICD-10-CM

## 2024-04-07 MED ORDER — TERCONAZOLE 0.4 % VA CREA: 1.0000 | TOPICAL_CREAM | Freq: Every day | VAGINAL | 0 refills | Status: DC

## 2024-04-07 NOTE — Patient Instructions (Signed)
 Congratulations, you're on your way to having your baby!!!   You now have an induction scheduled 04/26/24.   If your induction is scheduled for midnight, you will arrive at 11:45pm on the date provided on the form form the clinical staff today. [facility] (Entrance C) and tell them you are there for your induction. The hospital will only call you to not come if they have NO beds available and need to postpone your admission time.    If your induction is scheduled for the daytime, you will see an appointment time in MyChart. Please DO NOT show up at this time, this is just a placeholder on the schedule. You will get a call when your room is ready and will have 2 hours to arrive. The hospital staff can call anytime starting at 5 am through the rest of the day.   You will also get a call from the pre-admission nurse to go over pre-admission screen about 2 days prior to your induction date.

## 2024-04-07 NOTE — Progress Notes (Signed)
 IOL scheduled for 04/26/24 in AM.  Pt. Notified.  Mercedes Stake, RN

## 2024-04-07 NOTE — Progress Notes (Signed)
   PRENATAL VISIT NOTE  Subjective:  Erika Klein is a 28 y.o. [id number] at [redacted]w[redacted]d being seen today for ongoing prenatal care.  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   Patient reports no complaints.  Contractions: Irregular. Vag. Bleeding: None.  Movement: Present. Denies leaking of fluid.   The following portions of the patient's history were reviewed and updated as appropriate: allergies, current medications, past family history, past medical history, past social history, past surgical history and problem list.   Objective:   Vitals:   04/07/24 1644  BP: 125/82  Pulse: 88  Weight: 245 lb (111.1 kg)    Fetal Status: Fetal Heart Rate (bpm): 141   Movement: Present     General:  Alert, oriented and cooperative. Patient is in no acute distress.  Skin: Skin is warm and dry. No rash noted.   Cardiovascular: Normal heart rate noted  Respiratory: Normal respiratory effort, no problems with respiration noted  Abdomen: Soft, gravid, appropriate for gestational age.  Pain/Pressure: Present     Pelvic: Cervical exam deferred        Extremities: Normal range of motion.  Edema: None  Mental Status: Normal mood and affect. Normal behavior. Normal judgment and thought content.   5/28 US : Est. FW: 1822 gm 4 lb 3.9 %   Assessment and Plan:  Pregnancy: [id number] at [redacted]w[redacted]d 1. Unwanted fertility (Primary) - BTL papers signed 4/10. 2. Supervision of other high risk pregnancy,  antepartum - US  Fetal BPP W/O Non Stress; Future  3. 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 Polyhydramnios in second trimester, single or unspecified fetus - US  Fetal BPP W/O Non Stress; Future  5. [redacted] weeks gestation of pregnancy  6. Poor fetal growth affecting management of mother in third trimester, single or unspecified fetus - US  Fetal BPP W/O Non Stress; Future--scheduled BPP at [facility] 6/4 since MFM appt not scheduled and no appts available this week.  - Antenatal testing per MFM - IOL schedule 6/22 in accordance with MFM rec for IOL 37-38 weeks. Orders placed.   Preterm labor symptoms and general obstetric precautions including but not limited to vaginal bleeding, contractions, leaking of fluid and fetal movement were reviewed in detail with the patient. Please refer to After Visit Summary for other counseling recommendations.   No follow-ups on file.  Future Appointments  Date Time Provider Department Center  04/08/2024  2:35 PM [facility] US2 [facility]  04/22/2024  7:00 AM [facility] PROVIDER 1 [facility]  04/22/2024  7:30 AM [facility] US5 [facility]  04/22/2024  8:45 AM [facility] NST [facility]  04/22/2024  3:15 PM Ferdie Housekeeper, MD [facility]  04/26/2024  7:15 AM MC-LD SCHED ROOM MC-INDC None  04/30/2024  1:15 PM Tresia Fruit, MD [facility]  04/30/2024  2:00 PM [facility] PROVIDER 1 [facility]  04/30/2024  2:30 PM [facility] US3 [facility]  05/14/2024  3:00 PM Darlene Ehlers, MD [facility]  05/26/2024  4:00 PM Bernestine Brighter, PT [facility]    Kaitland Lewellyn  Felipe Horton, CNM [facility] for [facility]

## 2024-04-08 ENCOUNTER — Ambulatory Visit

## 2024-04-08 DIAGNOSIS — O403XX0 Polyhydramnios, third trimester, not applicable or unspecified: Secondary | ICD-10-CM

## 2024-04-08 DIAGNOSIS — O365930 Maternal care for other known or suspected poor fetal growth, third trimester, not applicable or unspecified: Secondary | ICD-10-CM | POA: Diagnosis not present

## 2024-04-08 DIAGNOSIS — Z3480 Encounter for supervision of other normal pregnancy, unspecified trimester: Secondary | ICD-10-CM

## 2024-04-08 DIAGNOSIS — O402XX0 Polyhydramnios, second trimester, not applicable or unspecified: Secondary | ICD-10-CM

## 2024-04-08 DIAGNOSIS — Z3A34 34 weeks gestation of pregnancy: Secondary | ICD-10-CM | POA: Diagnosis not present

## 2024-04-15 ENCOUNTER — Observation Stay (HOSPITAL_COMMUNITY)
Admission: AD | Admit: 2024-04-15 | Discharge: 2024-04-15 | Discharge status: Against Medical Advice | Attending: Obstetrics and Gynecology | Admitting: Obstetrics and Gynecology

## 2024-04-15 ENCOUNTER — Encounter (HOSPITAL_COMMUNITY): Payer: Self-pay | Admitting: Obstetrics and Gynecology

## 2024-04-15 DIAGNOSIS — S3991XA Unspecified injury of abdomen, initial encounter: Principal | ICD-10-CM | POA: Insufficient documentation

## 2024-04-15 DIAGNOSIS — Z7982 Long term (current) use of aspirin: Secondary | ICD-10-CM | POA: Diagnosis not present

## 2024-04-15 DIAGNOSIS — Z3A35 35 weeks gestation of pregnancy: Secondary | ICD-10-CM | POA: Diagnosis not present

## 2024-04-15 DIAGNOSIS — O99513 Diseases of the respiratory system complicating pregnancy, third trimester: Secondary | ICD-10-CM | POA: Insufficient documentation

## 2024-04-15 DIAGNOSIS — O368330 Maternal care for abnormalities of the fetal heart rate or rhythm, third trimester, not applicable or unspecified: Secondary | ICD-10-CM | POA: Insufficient documentation

## 2024-04-15 DIAGNOSIS — O9A213 Injury, poisoning and certain other consequences of external causes complicating pregnancy, third trimester: Secondary | ICD-10-CM | POA: Diagnosis present

## 2024-04-15 DIAGNOSIS — O0001 Abdominal pregnancy with intrauterine pregnancy: Secondary | ICD-10-CM | POA: Diagnosis not present

## 2024-04-15 DIAGNOSIS — O368130 Decreased fetal movements, third trimester, not applicable or unspecified: Secondary | ICD-10-CM

## 2024-04-15 DIAGNOSIS — T7491XA Unspecified adult maltreatment, confirmed, initial encounter: Secondary | ICD-10-CM

## 2024-04-15 LAB — GC/CHLAMYDIA PROBE AMP (~~LOC~~) NOT AT ARMC
Chlamydia: NEGATIVE
Comment: NEGATIVE
Comment: NORMAL
Neisseria Gonorrhea: NEGATIVE

## 2024-04-15 LAB — URINALYSIS, ROUTINE W REFLEX MICROSCOPIC
Bilirubin Urine: NEGATIVE
Glucose, UA: NEGATIVE mg/dL
Hgb urine dipstick: NEGATIVE
Ketones, ur: 5 mg/dL — AB
Leukocytes,Ua: NEGATIVE
Nitrite: NEGATIVE
Protein, ur: 30 mg/dL — AB
Specific Gravity, Urine: 1.023 (ref 1.005–1.030)
pH: 5 (ref 5.0–8.0)

## 2024-04-15 LAB — WET PREP, GENITAL
Clue Cells Wet Prep HPF POC: NONE SEEN
Sperm: NONE SEEN
Trich, Wet Prep: NONE SEEN
WBC, Wet Prep HPF POC: <10 (ref <10)
Yeast Wet Prep HPF POC: NONE SEEN

## 2024-04-15 MED ORDER — ACETAMINOPHEN 325 MG PO TABS: 650.0000 mg | ORAL_TABLET | ORAL | Status: DC | PRN

## 2024-04-15 MED ORDER — CALCIUM CARBONATE ANTACID 500 MG PO CHEW: 2.0000 | CHEWABLE_TABLET | ORAL | Status: DC | PRN

## 2024-04-15 MED ORDER — DOCUSATE SODIUM 100 MG PO CAPS: 100.0000 mg | ORAL_CAPSULE | Freq: Every day | ORAL | Status: DC

## 2024-04-15 MED ORDER — PRENATAL MULTIVITAMIN CH: 1.0000 | ORAL_TABLET | Freq: Every day | ORAL | Status: DC
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
## 2024-04-15 NOTE — MAU Note (Signed)
 Patient reports she has to leave because she does not have childcare for her son. Provider made aware.
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
## 2024-04-16 ENCOUNTER — Inpatient Hospital Stay (HOSPITAL_COMMUNITY)

## 2024-04-16 ENCOUNTER — Encounter (HOSPITAL_COMMUNITY): Payer: Self-pay | Admitting: Obstetrics & Gynecology

## 2024-04-16 ENCOUNTER — Inpatient Hospital Stay (HOSPITAL_COMMUNITY)
Admission: AD | Admit: 2024-04-16 | Discharge: 2024-04-16 | Discharge status: Against Medical Advice | Attending: Obstetrics & Gynecology | Admitting: Obstetrics & Gynecology

## 2024-04-16 DIAGNOSIS — R6884 Jaw pain: Secondary | ICD-10-CM | POA: Diagnosis not present

## 2024-04-16 DIAGNOSIS — O26893 Other specified pregnancy related conditions, third trimester: Secondary | ICD-10-CM | POA: Diagnosis present

## 2024-04-16 DIAGNOSIS — Z3A36 36 weeks gestation of pregnancy: Secondary | ICD-10-CM
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
## 2024-04-16 NOTE — Progress Notes (Signed)
 CSW has delivered CPS social worker Doc Freed to the room with MOB and her son.  Jenney Modest, Milinda Allen Clinical Social Worker [phone]

## 2024-04-17 ENCOUNTER — Ambulatory Visit

## 2024-04-17 ENCOUNTER — Other Ambulatory Visit

## 2024-04-17 DIAGNOSIS — Z3480 Encounter for supervision of other normal pregnancy, unspecified trimester: Secondary | ICD-10-CM

## 2024-04-21 ENCOUNTER — Telehealth (HOSPITAL_COMMUNITY): Payer: Self-pay | Admitting: *Deleted

## 2024-04-21 ENCOUNTER — Encounter (HOSPITAL_COMMUNITY): Payer: Self-pay

## 2024-04-21 NOTE — Telephone Encounter (Signed)
 Preadmission screen

## 2024-04-22 ENCOUNTER — Telehealth (HOSPITAL_COMMUNITY): Payer: Self-pay | Admitting: *Deleted

## 2024-04-22 ENCOUNTER — Encounter (HOSPITAL_COMMUNITY): Payer: Self-pay | Admitting: *Deleted

## 2024-04-22 ENCOUNTER — Encounter: Payer: Self-pay | Admitting: Family Medicine

## 2024-04-22 ENCOUNTER — Other Ambulatory Visit

## 2024-04-22 ENCOUNTER — Ambulatory Visit: Attending: Maternal & Fetal Medicine

## 2024-04-22 ENCOUNTER — Ambulatory Visit

## 2024-04-22 NOTE — Telephone Encounter (Signed)
 Preadmission screen

## 2024-04-26 ENCOUNTER — Observation Stay (HOSPITAL_COMMUNITY)
Admission: RE | Admit: 2024-04-26 | Discharge: 2024-04-26 | DRG: 807 | Disposition: A | Discharge status: Home / Self Care | Attending: Obstetrics and Gynecology | Admitting: Obstetrics and Gynecology

## 2024-04-26 ENCOUNTER — Inpatient Hospital Stay (HOSPITAL_BASED_OUTPATIENT_CLINIC_OR_DEPARTMENT_OTHER)

## 2024-04-26 ENCOUNTER — Encounter (HOSPITAL_COMMUNITY): Payer: Self-pay | Admitting: Family Medicine

## 2024-04-26 ENCOUNTER — Inpatient Hospital Stay (HOSPITAL_COMMUNITY)

## 2024-04-26 ENCOUNTER — Other Ambulatory Visit: Payer: Self-pay

## 2024-04-26 DIAGNOSIS — O328XX0 Maternal care for other malpresentation of fetus, not applicable or unspecified: Secondary | ICD-10-CM | POA: Insufficient documentation

## 2024-04-26 DIAGNOSIS — Z79899 Other long term (current) drug therapy: Secondary | ICD-10-CM

## 2024-04-26 DIAGNOSIS — O365930 Maternal care for other known or suspected poor fetal growth, third trimester, not applicable or unspecified: Principal | ICD-10-CM

## 2024-04-26 DIAGNOSIS — O99213 Obesity complicating pregnancy, third trimester: Secondary | ICD-10-CM | POA: Diagnosis not present

## 2024-04-26 DIAGNOSIS — D563 Thalassemia minor: Secondary | ICD-10-CM | POA: Diagnosis present

## 2024-04-26 DIAGNOSIS — Z825 Family history of asthma and other chronic lower respiratory diseases: Secondary | ICD-10-CM | POA: Diagnosis not present

## 2024-04-26 DIAGNOSIS — O403XX0 Polyhydramnios, third trimester, not applicable or unspecified: Secondary | ICD-10-CM | POA: Diagnosis not present

## 2024-04-26 DIAGNOSIS — Z8049 Family history of malignant neoplasm of other genital organs: Secondary | ICD-10-CM

## 2024-04-26 DIAGNOSIS — O09293 Supervision of pregnancy with other poor reproductive or obstetric history, third trimester: Secondary | ICD-10-CM | POA: Diagnosis not present

## 2024-04-26 DIAGNOSIS — Z823 Family history of stroke: Secondary | ICD-10-CM | POA: Diagnosis not present

## 2024-04-26 DIAGNOSIS — Z3A37 37 weeks gestation of pregnancy: Secondary | ICD-10-CM | POA: Diagnosis not present

## 2024-04-26 DIAGNOSIS — Z818 Family history of other mental and behavioral disorders: Secondary | ICD-10-CM | POA: Diagnosis not present

## 2024-04-26 DIAGNOSIS — E669 Obesity, unspecified: Secondary | ICD-10-CM

## 2024-04-26 DIAGNOSIS — J45909 Unspecified asthma, uncomplicated: Secondary | ICD-10-CM | POA: Diagnosis present

## 2024-04-26 DIAGNOSIS — O9952 Diseases of the respiratory system complicating childbirth: Secondary | ICD-10-CM | POA: Diagnosis present

## 2024-04-26 DIAGNOSIS — Z3A33 33 weeks gestation of pregnancy: Secondary | ICD-10-CM | POA: Insufficient documentation

## 2024-04-26 DIAGNOSIS — Z8249 Family history of ischemic heart disease and other diseases of the circulatory system: Secondary | ICD-10-CM | POA: Diagnosis not present

## 2024-04-26 DIAGNOSIS — Z7982 Long term (current) use of aspirin: Secondary | ICD-10-CM | POA: Diagnosis not present

## 2024-04-26 DIAGNOSIS — Z5941 Food insecurity: Secondary | ICD-10-CM

## 2024-04-26 DIAGNOSIS — O403XX1 Polyhydramnios, third trimester, fetus 1: Secondary | ICD-10-CM | POA: Diagnosis present

## 2024-04-26 DIAGNOSIS — O365990 Maternal care for other known or suspected poor fetal growth, unspecified trimester, not applicable or unspecified: Principal | ICD-10-CM | POA: Diagnosis present

## 2024-04-26 MED ORDER — LACTATED RINGERS IV SOLN: 500.0000 mL | INTRAVENOUS | Status: DC | PRN

## 2024-04-26 MED ORDER — OXYTOCIN BOLUS FROM INFUSION: 333.0000 mL | Freq: Once | INTRAVENOUS | Status: DC

## 2024-04-26 MED ORDER — OXYCODONE-ACETAMINOPHEN 5-325 MG PO TABS: 1.0000 | ORAL_TABLET | ORAL | Status: DC | PRN

## 2024-04-26 MED ORDER — LIDOCAINE HCL (PF) 1 % IJ SOLN: 30.0000 mL | INTRAMUSCULAR | Status: DC | PRN

## 2024-04-26 MED ORDER — FLEET ENEMA RE ENEM: 1.0000 | ENEMA | RECTAL | Status: DC | PRN

## 2024-04-26 MED ORDER — LACTATED RINGERS IV SOLN: INTRAVENOUS | Status: DC

## 2024-04-26 MED ORDER — OXYCODONE-ACETAMINOPHEN 5-325 MG PO TABS: 2.0000 | ORAL_TABLET | ORAL | Status: DC | PRN

## 2024-04-26 MED ORDER — FENTANYL CITRATE (PF) 100 MCG/2ML IJ SOLN: 50.0000 ug | INTRAMUSCULAR | Status: DC | PRN

## 2024-04-26 MED ORDER — ACETAMINOPHEN 325 MG PO TABS: 650.0000 mg | ORAL_TABLET | ORAL | Status: DC | PRN

## 2024-04-26 MED ORDER — OXYTOCIN-SODIUM CHLORIDE 30-0.9 UT/500ML-% IV SOLN: 2.5000 [IU]/h | INTRAVENOUS | Status: DC

## 2024-04-26 MED ORDER — SOD CITRATE-CITRIC ACID 500-334 MG/5ML PO SOLN: 30.0000 mL | ORAL | Status: DC | PRN

## 2024-04-26 MED ORDER — ONDANSETRON HCL 4 MG/2ML IJ SOLN: 4.0000 mg | Freq: Four times a day (QID) | INTRAMUSCULAR | Status: DC | PRN
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
## 2024-04-27 ENCOUNTER — Telehealth (HOSPITAL_COMMUNITY): Payer: Self-pay | Admitting: *Deleted

## 2024-04-27 NOTE — Telephone Encounter (Signed)
 Preadmission screen

## 2024-04-29 ENCOUNTER — Inpatient Hospital Stay (HOSPITAL_COMMUNITY): Admitting: Anesthesiology

## 2024-04-29 ENCOUNTER — Encounter (HOSPITAL_COMMUNITY): Payer: Self-pay | Admitting: Obstetrics and Gynecology

## 2024-04-29 ENCOUNTER — Inpatient Hospital Stay (HOSPITAL_COMMUNITY)
Admission: RE | Admit: 2024-04-29 | Discharge: 2024-05-01 | DRG: 807 | Disposition: A | Discharge status: Home / Self Care | Attending: Obstetrics and Gynecology | Admitting: Obstetrics and Gynecology

## 2024-04-29 ENCOUNTER — Other Ambulatory Visit: Payer: Self-pay

## 2024-04-29 ENCOUNTER — Inpatient Hospital Stay (HOSPITAL_COMMUNITY)

## 2024-04-29 DIAGNOSIS — O283 Abnormal ultrasonic finding on antenatal screening of mother: Secondary | ICD-10-CM | POA: Diagnosis present

## 2024-04-29 DIAGNOSIS — O365990 Maternal care for other known or suspected poor fetal growth, unspecified trimester, not applicable or unspecified: Secondary | ICD-10-CM | POA: Diagnosis present

## 2024-04-29 DIAGNOSIS — Z3A37 37 weeks gestation of pregnancy: Secondary | ICD-10-CM | POA: Diagnosis not present

## 2024-04-29 DIAGNOSIS — Z8249 Family history of ischemic heart disease and other diseases of the circulatory system: Secondary | ICD-10-CM

## 2024-04-29 DIAGNOSIS — O99214 Obesity complicating childbirth: Secondary | ICD-10-CM | POA: Diagnosis present

## 2024-04-29 DIAGNOSIS — D509 Iron deficiency anemia, unspecified: Secondary | ICD-10-CM | POA: Diagnosis present

## 2024-04-29 DIAGNOSIS — O99824 Streptococcus B carrier state complicating childbirth: Secondary | ICD-10-CM | POA: Diagnosis present

## 2024-04-29 DIAGNOSIS — O402XX0 Polyhydramnios, second trimester, not applicable or unspecified: Secondary | ICD-10-CM | POA: Diagnosis present

## 2024-04-29 DIAGNOSIS — Z7982 Long term (current) use of aspirin: Secondary | ICD-10-CM | POA: Diagnosis not present

## 2024-04-29 DIAGNOSIS — Z3009 Encounter for other general counseling and advice on contraception: Secondary | ICD-10-CM | POA: Diagnosis present

## 2024-04-29 DIAGNOSIS — O403XX0 Polyhydramnios, third trimester, not applicable or unspecified: Secondary | ICD-10-CM | POA: Diagnosis present

## 2024-04-29 DIAGNOSIS — O365930 Maternal care for other known or suspected poor fetal growth, third trimester, not applicable or unspecified: Secondary | ICD-10-CM | POA: Diagnosis present

## 2024-04-29 DIAGNOSIS — Z148 Genetic carrier of other disease: Secondary | ICD-10-CM

## 2024-04-29 DIAGNOSIS — R198 Other specified symptoms and signs involving the digestive system and abdomen: Secondary | ICD-10-CM | POA: Diagnosis present

## 2024-04-29 DIAGNOSIS — O9902 Anemia complicating childbirth: Secondary | ICD-10-CM | POA: Diagnosis present

## 2024-04-29 DIAGNOSIS — R87612 Low grade squamous intraepithelial lesion on cytologic smear of cervix (LGSIL): Secondary | ICD-10-CM | POA: Diagnosis present

## 2024-04-29 DIAGNOSIS — D563 Thalassemia minor: Secondary | ICD-10-CM | POA: Diagnosis present

## 2024-04-29 DIAGNOSIS — Z3480 Encounter for supervision of other normal pregnancy, unspecified trimester: Principal | ICD-10-CM

## 2024-04-29 DIAGNOSIS — O99210 Obesity complicating pregnancy, unspecified trimester: Secondary | ICD-10-CM | POA: Diagnosis present

## 2024-04-29 LAB — RPR: RPR Ser Ql: NONREACTIVE

## 2024-04-29 LAB — CBC
HCT: 37 % (ref 36.0–46.0)
Hemoglobin: 12.1 g/dL (ref 12.0–15.0)
MCH: 30.2 pg (ref 26.0–34.0)
MCHC: 32.7 g/dL (ref 30.0–36.0)
MCV: 92.3 fL (ref 80.0–100.0)
Platelets: 183 10*3/uL (ref 150–400)
RBC: 4.01 MIL/uL (ref 3.87–5.11)
RDW: 12.8 % (ref 11.5–15.5)
WBC: 3.5 10*3/uL — ABNORMAL LOW (ref 4.0–10.5)
nRBC: 0 % (ref 0.0–0.2)

## 2024-04-29 LAB — TYPE AND SCREEN
ABO/RH(D): O POS
Antibody Screen: NEGATIVE

## 2024-04-29 LAB — GROUP B STREP BY PCR: Group B strep by PCR: POSITIVE — AB

## 2024-04-29 MED ORDER — WITCH HAZEL-GLYCERIN EX PADS: 1.0000 | MEDICATED_PAD | CUTANEOUS | Status: DC | PRN

## 2024-04-29 MED ORDER — ACETAMINOPHEN 325 MG PO TABS: 650.0000 mg | ORAL_TABLET | ORAL | Status: DC | PRN

## 2024-04-29 MED ORDER — LACTATED RINGERS IV SOLN
500.0000 mL | Freq: Once | INTRAVENOUS | Status: AC
  Administered 2024-04-29: 500 mL via INTRAVENOUS

## 2024-04-29 MED ORDER — TETANUS-DIPHTH-ACELL PERTUSSIS 5-2.5-18.5 LF-MCG/0.5 IM SUSY: 0.5000 mL | PREFILLED_SYRINGE | Freq: Once | INTRAMUSCULAR | Status: DC

## 2024-04-29 MED ORDER — LIDOCAINE HCL (PF) 1 % IJ SOLN
INTRAMUSCULAR | Status: DC | PRN
Start: 2024-04-29 — End: 2024-04-29
  Administered 2024-04-29: 8 mL via EPIDURAL

## 2024-04-29 MED ORDER — DIBUCAINE (PERIANAL) 1 % EX OINT: 1.0000 | TOPICAL_OINTMENT | CUTANEOUS | Status: DC | PRN

## 2024-04-29 MED ORDER — SENNOSIDES-DOCUSATE SODIUM 8.6-50 MG PO TABS
2.0000 | ORAL_TABLET | Freq: Every day | ORAL | Status: DC
  Administered 2024-04-30 – 2024-05-01 (×2): 2 via ORAL
  Filled 2024-04-29 (×2): qty 2

## 2024-04-29 MED ORDER — ZOLPIDEM TARTRATE 5 MG PO TABS: 5.0000 mg | ORAL_TABLET | Freq: Every evening | ORAL | Status: DC | PRN

## 2024-04-29 MED ORDER — SODIUM CHLORIDE 0.9 % IV SOLN
5.0000 10*6.[IU] | Freq: Once | INTRAVENOUS | Status: AC
  Administered 2024-04-29: 5 10*6.[IU] via INTRAVENOUS
  Filled 2024-04-29: qty 5

## 2024-04-29 MED ORDER — OXYTOCIN-SODIUM CHLORIDE 30-0.9 UT/500ML-% IV SOLN
2.5000 [IU]/h | INTRAVENOUS | Status: DC
  Administered 2024-04-29: 2.5 [IU]/h via INTRAVENOUS
  Filled 2024-04-29: qty 500

## 2024-04-29 MED ORDER — PHENYLEPHRINE 80 MCG/ML (10ML) SYRINGE FOR IV PUSH (FOR BLOOD PRESSURE SUPPORT)
80.0000 ug | PREFILLED_SYRINGE | INTRAVENOUS | Status: DC | PRN
Start: 2024-04-29 — End: 2024-04-29

## 2024-04-29 MED ORDER — SIMETHICONE 80 MG PO CHEW: 80.0000 mg | CHEWABLE_TABLET | ORAL | Status: DC | PRN

## 2024-04-29 MED ORDER — OXYTOCIN BOLUS FROM INFUSION
333.0000 mL | Freq: Once | INTRAVENOUS | Status: AC
  Administered 2024-04-29: 333 mL via INTRAVENOUS

## 2024-04-29 MED ORDER — EPHEDRINE 5 MG/ML INJ: 10.0000 mg | INTRAVENOUS | Status: DC | PRN

## 2024-04-29 MED ORDER — DIPHENHYDRAMINE HCL 50 MG/ML IJ SOLN: 12.5000 mg | INTRAMUSCULAR | Status: DC | PRN

## 2024-04-29 MED ORDER — OXYCODONE-ACETAMINOPHEN 5-325 MG PO TABS: 2.0000 | ORAL_TABLET | ORAL | Status: DC | PRN

## 2024-04-29 MED ORDER — ONDANSETRON HCL 4 MG/2ML IJ SOLN: 4.0000 mg | Freq: Four times a day (QID) | INTRAMUSCULAR | Status: DC | PRN

## 2024-04-29 MED ORDER — PHENYLEPHRINE 80 MCG/ML (10ML) SYRINGE FOR IV PUSH (FOR BLOOD PRESSURE SUPPORT): 80.0000 ug | PREFILLED_SYRINGE | INTRAVENOUS | Status: DC | PRN

## 2024-04-29 MED ORDER — ACETAMINOPHEN 325 MG PO TABS
650.0000 mg | ORAL_TABLET | ORAL | Status: DC | PRN
  Administered 2024-04-29: 650 mg via ORAL
  Filled 2024-04-29: qty 2

## 2024-04-29 MED ORDER — LIDOCAINE HCL (PF) 1 % IJ SOLN: 30.0000 mL | INTRAMUSCULAR | Status: DC | PRN

## 2024-04-29 MED ORDER — PRENATAL MULTIVITAMIN CH
1.0000 | ORAL_TABLET | Freq: Every day | ORAL | Status: DC
  Administered 2024-04-30 – 2024-05-01 (×2): 1 via ORAL
  Filled 2024-04-29 (×2): qty 1

## 2024-04-29 MED ORDER — LACTATED RINGERS IV SOLN: 500.0000 mL | INTRAVENOUS | Status: DC | PRN

## 2024-04-29 MED ORDER — SOD CITRATE-CITRIC ACID 500-334 MG/5ML PO SOLN: 30.0000 mL | ORAL | Status: DC | PRN

## 2024-04-29 MED ORDER — COCONUT OIL OIL
1.0000 | TOPICAL_OIL | Status: DC | PRN
Start: 2024-04-29 — End: 2024-05-01

## 2024-04-29 MED ORDER — TRANEXAMIC ACID-NACL 1000-0.7 MG/100ML-% IV SOLN
INTRAVENOUS | Status: AC
  Administered 2024-04-29: 1000 mg via INTRAVENOUS
  Filled 2024-04-29: qty 100

## 2024-04-29 MED ORDER — IBUPROFEN 600 MG PO TABS
600.0000 mg | ORAL_TABLET | Freq: Four times a day (QID) | ORAL | Status: DC
  Administered 2024-04-30 – 2024-05-01 (×7): 600 mg via ORAL
  Filled 2024-04-29 (×7): qty 1

## 2024-04-29 MED ORDER — ONDANSETRON HCL 4 MG/2ML IJ SOLN: 4.0000 mg | INTRAMUSCULAR | Status: DC | PRN

## 2024-04-29 MED ORDER — LACTATED RINGERS IV SOLN: 500.0000 mL | Freq: Once | INTRAVENOUS | Status: DC

## 2024-04-29 MED ORDER — FENTANYL-BUPIVACAINE-NACL 0.5-0.125-0.9 MG/250ML-% EP SOLN: 12.0000 mL/h | EPIDURAL | Status: DC | PRN

## 2024-04-29 MED ORDER — BENZOCAINE-MENTHOL 20-0.5 % EX AERO: 1.0000 | INHALATION_SPRAY | CUTANEOUS | Status: DC | PRN

## 2024-04-29 MED ORDER — ONDANSETRON HCL 4 MG PO TABS: 4.0000 mg | ORAL_TABLET | ORAL | Status: DC | PRN

## 2024-04-29 MED ORDER — LACTATED RINGERS IV SOLN: INTRAVENOUS | Status: DC

## 2024-04-29 MED ORDER — FENTANYL-BUPIVACAINE-NACL 0.5-0.125-0.9 MG/250ML-% EP SOLN
12.0000 mL/h | EPIDURAL | Status: DC | PRN
  Administered 2024-04-29: 12 mL/h via EPIDURAL
  Filled 2024-04-29: qty 250

## 2024-04-29 MED ORDER — EPHEDRINE 5 MG/ML INJ
10.0000 mg | INTRAVENOUS | Status: DC | PRN
Start: 2024-04-29 — End: 2024-04-29

## 2024-04-29 MED ORDER — OXYCODONE-ACETAMINOPHEN 5-325 MG PO TABS: 1.0000 | ORAL_TABLET | ORAL | Status: DC | PRN

## 2024-04-29 MED ORDER — TERBUTALINE SULFATE 1 MG/ML IJ SOLN: 0.2500 mg | Freq: Once | INTRAMUSCULAR | Status: DC | PRN

## 2024-04-29 MED ORDER — PENICILLIN G POT IN DEXTROSE 60000 UNIT/ML IV SOLN
3.0000 10*6.[IU] | INTRAVENOUS | Status: DC
  Administered 2024-04-29: 3 10*6.[IU] via INTRAVENOUS
  Filled 2024-04-29: qty 50

## 2024-04-29 MED ORDER — DIPHENHYDRAMINE HCL 25 MG PO CAPS: 25.0000 mg | ORAL_CAPSULE | Freq: Four times a day (QID) | ORAL | Status: DC | PRN

## 2024-04-29 MED ORDER — TRANEXAMIC ACID-NACL 1000-0.7 MG/100ML-% IV SOLN: 1000.0000 mg | INTRAVENOUS | Status: AC

## 2024-04-29 MED ORDER — OXYTOCIN-SODIUM CHLORIDE 30-0.9 UT/500ML-% IV SOLN
1.0000 m[IU]/min | INTRAVENOUS | Status: DC
  Administered 2024-04-29: 2 m[IU]/min via INTRAVENOUS

## 2024-04-29 NOTE — Anesthesia Preprocedure Evaluation (Signed)
 Anesthesia Evaluation  Patient identified by MRN, date of birth, ID band Patient awake    Reviewed: Allergy & Precautions, H&P , NPO status , Patient's Chart, lab work & pertinent test results, reviewed documented beta blocker date and time   Airway Mallampati: II  TM Distance: >3 FB Neck ROM: full    Dental no notable dental hx.    Pulmonary asthma    Pulmonary exam normal breath sounds clear to auscultation       Cardiovascular negative cardio ROS Normal cardiovascular exam Rhythm:regular Rate:Normal     Neuro/Psych negative neurological ROS  negative psych ROS   GI/Hepatic negative GI ROS, Neg liver ROS,,,  Endo/Other  negative endocrine ROS    Renal/GU negative Renal ROS  negative genitourinary   Musculoskeletal   Abdominal   Peds  Hematology negative hematology ROS (+)   Anesthesia Other Findings   Reproductive/Obstetrics (+) Pregnancy                             Anesthesia Physical Anesthesia Plan  ASA: 2  Anesthesia Plan: Epidural   Post-op Pain Management: Minimal or no pain anticipated   Induction: Intravenous  PONV Risk Score and Plan: 2 and Treatment may vary due to age or medical condition  Airway Management Planned: Natural Airway  Additional Equipment: Fetal Monitoring  Intra-op Plan:   Post-operative Plan:   Informed Consent: I have reviewed the patients History and Physical, chart, labs and discussed the procedure including the risks, benefits and alternatives for the proposed anesthesia with the patient or authorized representative who has indicated his/her understanding and acceptance.       Plan Discussed with: Anesthesiologist  Anesthesia Plan Comments:        Anesthesia Quick Evaluation

## 2024-04-29 NOTE — Discharge Summary (Signed)
 Postpartum Discharge Summary     Patient Name: Erika Klein DOB: 1996/04/08 MRN: [id number]  Date of admission: 04/29/2024 Delivery date:04/29/2024 Delivering provider: NICHOLAUS ALMARIE HERO Date of discharge: 05/01/2024  Admitting diagnosis: Pregnancy [Z34.90] Normal labor [O80, Z37.9] Intrauterine pregnancy: [redacted]w[redacted]d     Secondary diagnosis:  Principal Problem:   SVD (spontaneous vaginal delivery) Active Problems:   LGSIL on Pap smear of cervix   Obesity affecting pregnancy   Supervision of other normal pregnancy, antepartum   Unwanted fertility   Alpha thalassemia silent carrier   Abnormal findings of gastrointestinal tract   IUGR (intrauterine growth restriction) affecting care of mother   Polyhydramnios in second trimester, antepartum complication   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 yo [id number] at [redacted]w[redacted]d was admitted to the hospital 04/29/2024 for induction of labor.  Indication for induction: IUGR.  Patient had an labor course complicated by none. Membrane Rupture Time/Date: 4:33 PM,04/29/2024  Delivery Method:Vaginal, Spontaneous Operative Delivery:N/A Episiotomy: None Lacerations:  Periurethral Details of delivery can be found in separate delivery note.  Patient had a postpartum course complicated by nothing. Patient is discharged home 05/01/24.  Newborn Data: Birth date:04/29/2024 Birth time:7:45 PM Gender:Female Living status:Living Apgars:9 ,9  Weight:2438 g  Magnesium Sulfate received: No BMZ received: No Rhophylac:No MMR:No T-DaP:Given prenatally Flu: No RSV Vaccine received: No Transfusion:No  Immunizations received: There is no immunization history for  the selected administration types on file for this patient.  Physical exam  Vitals:   04/30/24 0736 04/30/24 1213 04/30/24 2029 05/01/24 0549  BP: (!) 139/59 (!) 123/51 133/80 129/88  Pulse: 64 60 70 60  Resp: 17 17 18 18   Temp: 97.8 F (36.6 C) (!) 97.5 F (36.4 C) 97.6 F (36.4 C) 98 F (36.7 C)  TempSrc: Oral Axillary Oral Oral  SpO2:   100% 100%  Weight:      Height:       General: alert, cooperative, and no distress Lochia: appropriate Uterine Fundus: firm Incision: N/A DVT Evaluation: No significant calf/ankle edema. Labs: Lab Results  Component Value Date   WBC 7.1 04/30/2024   HGB 10.6 (L) 04/30/2024   HCT 33.5 (L) 04/30/2024   MCV 92.8 04/30/2024   PLT 156 04/30/2024      Latest Ref Rng & Units 08/16/2021    1:12 PM  CMP  Glucose 70 - 99 mg/dL 70   BUN 6 - 20 mg/dL 10   Creatinine 9.55 - 1.00 mg/dL 9.20   Sodium 864 - 854 mmol/L 135   Potassium 3.5 - 5.1 mmol/L 3.5   Chloride 98 - 111 mmol/L 104   CO2 22 - 32 mmol/L 23   Calcium  8.9 - 10.3 mg/dL 8.9   Total Protein 6.5 - 8.1 g/dL 6.0   Total Bilirubin 0.3 - 1.2 mg/dL 0.7   Alkaline Phos 38 - 126 U/L 148   AST 15 - 41 U/L 20   ALT 0 - 44 U/L 14    Edinburgh Score:    04/29/2024   11:42  PM  Edinburgh Postnatal Depression Scale Screening Tool  I have been able to laugh and see the funny side of things. 0  I have looked forward with enjoyment to things. 0  I have blamed myself unnecessarily when things went wrong. 1  I have been anxious or worried for no good reason. 2  I have felt scared or panicky for no good reason. 0  Things have been getting on top of me. 1  I have been so unhappy that I have had difficulty sleeping. 1  I have felt sad or miserable. 1  I have been so unhappy that I have been crying. 1  The thought of harming myself has occurred to me. 1  Edinburgh Postnatal Depression Scale Total 8   No data recorded  After visit meds:  Allergies as of 05/01/2024   No Known Allergies       Medication List     STOP taking these medications    aspirin  EC 81 MG tablet   ferrous sulfate  325 (65 FE) MG tablet   lidocaine  5 % ointment Commonly known as: XYLOCAINE    terconazole  0.4 % vaginal cream Commonly known as: TERAZOL 7        TAKE these medications    ACCRUFeR  30 MG Caps Generic drug: Ferric Maltol  Take 1 capsule (30 mg total) by mouth daily.   benzocaine -Menthol  20-0.5 % Aero Commonly known as: DERMOPLAST Apply 1 Application topically as needed for irritation (perineal discomfort).   ibuprofen  600 MG tablet Commonly known as: ADVIL  Take 1 tablet (600 mg total) by mouth every 6 (six) hours.   Prenatal Gummies 0.18-25 MG Chew Take 2 gummies by mouth every day   senna-docusate 8.6-50 MG tablet Commonly known as: Senokot-S Take 2 tablets by mouth daily.         Discharge home in stable condition Infant Feeding: Formula Infant Disposition:home with mother Discharge instruction: per After Visit Summary and Postpartum booklet. Activity: Advance as tolerated. Pelvic rest for 6 weeks.  Diet: routine diet Future Appointments: Future Appointments  Date Time Provider Department Center  05/14/2024  3:00 PM Guadlupe Lianne DASEN, MD [facility]  05/26/2024  4:00 PM Elnor Channing FALCON, PT [facility]  06/11/2024 10:35 AM Anyanwu, Gloris LABOR, MD [facility]   Follow up Visit:  Message sent to [facility] 6/25  Please schedule this patient for a In person postpartum visit in 6 weeks with the following provider: Any provider. Additional Postpartum F/U:None  Low risk pregnancy complicated by: IUGR Delivery mode:  Vaginal, Spontaneous Anticipated Birth Control:  Unsure   05/01/2024 [name], MD

## 2024-04-29 NOTE — Lactation Note (Signed)
 This note was copied from a baby's chart. Lactation Consultation Note  Patient Name: Erika Klein Unijb'd Date: 04/29/2024 Age:27 hours Reason for consult: Initial assessment  MOB is formula feeding only. Please let LC team know if she is needing assistance at any time.  Feeding Mother's Current Feeding Choice: Formula  Consult Status Consult Status: Complete Date: 04/29/24    Recardo Hoit BS, IBCLC 04/29/2024, 7:53 PM
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
## 2024-04-29 NOTE — Anesthesia Procedure Notes (Signed)
 Epidural Patient location during procedure: OB Start time: 04/29/2024 3:04 PM End time: 04/29/2024 3:10 PM  Staffing Anesthesiologist: Mallory Manus, MD  Preanesthetic Checklist Completed: patient identified, IV checked, site marked, risks and benefits discussed, surgical consent, monitors and equipment checked, pre-op evaluation and timeout performed  Epidural Patient position: sitting Prep: DuraPrep and site prepped and draped Patient monitoring: continuous pulse ox and blood pressure Approach: midline Location: L2-L3 Injection technique: LOR air  Needle:  Needle type: Tuohy  Needle gauge: 17 G Needle length: 9 cm and 9 Needle insertion depth: 7 cm Catheter type: closed end flexible Catheter size: 19 Gauge Catheter at skin depth: 13 cm Test dose: negative  Assessment Events: blood not aspirated, no cerebrospinal fluid, injection not painful, no injection resistance, no paresthesia and negative IV test

## 2024-04-29 NOTE — Progress Notes (Signed)
 Labor Progress Note Erika Klein is a 28 y.o. H4E6986 at [redacted]w[redacted]d presented for IOL IUGR  S: Resting comfortably since epidural placement. Amenable to AROM  O:  BP (!) 113/55   Pulse 61   Temp 98.2 F (36.8 C) (Oral)   Resp 18   Ht 5' 4 (1.626 m)   Wt 111.6 kg   LMP 07/13/2023   SpO2 99%   BMI 42.23 kg/m  EFM: baseline 130 bpm/ moderate variability/ + accels/ no decels  Toco: ctx q5-7 min SVE: Dilation: 6 Effacement (%): 80 Station: -2 Presentation: Vertex Exam by:: Dr. Claudene Pitocin : 0 mu/min  A/P: 28 y.o. H4E6986 [redacted]w[redacted]d  1. Labor: Labor progressing well. S/p AROM. Continue expectant management 2. FWB: Cat 1 3. Pain: Epidural in place 4. GBS pos > PCN   Anticipate SVD.  [name] Claudene, MD 4:36 PM

## 2024-04-30 ENCOUNTER — Encounter: Payer: Self-pay | Admitting: Obstetrics & Gynecology

## 2024-04-30 ENCOUNTER — Other Ambulatory Visit

## 2024-04-30 LAB — CBC
HCT: 33.5 % — ABNORMAL LOW (ref 36.0–46.0)
Hemoglobin: 10.6 g/dL — ABNORMAL LOW (ref 12.0–15.0)
MCH: 29.4 pg (ref 26.0–34.0)
MCHC: 31.6 g/dL (ref 30.0–36.0)
MCV: 92.8 fL (ref 80.0–100.0)
Platelets: 156 10*3/uL (ref 150–400)
RBC: 3.61 MIL/uL — ABNORMAL LOW (ref 3.87–5.11)
RDW: 12.8 % (ref 11.5–15.5)
WBC: 7.1 10*3/uL (ref 4.0–10.5)
nRBC: 0 % (ref 0.0–0.2)

## 2024-04-30 NOTE — Anesthesia Postprocedure Evaluation (Signed)
 Anesthesia Post Note  Patient: Erika Klein  Procedure(s) Performed: AN AD HOC LABOR EPIDURAL     Patient location during evaluation: Mother Baby Anesthesia Type: Epidural Level of consciousness: awake and alert and oriented Pain management: satisfactory to patient Vital Signs Assessment: post-procedure vital signs reviewed and stable Respiratory status: respiratory function stable Cardiovascular status: stable Postop Assessment: no headache, no backache, epidural receding, patient able to bend at knees, no signs of nausea or vomiting, adequate PO intake and able to ambulate Anesthetic complications: no   No notable events documented.  Last Vitals:  Vitals:   04/30/24 0408 04/30/24 0736  BP: 124/78 (!) 139/59  Pulse: 61 64  Resp: 16 17  Temp: 36.6 C 36.6 C  SpO2: 100%     Last Pain:  Vitals:   04/30/24 0736  TempSrc: Oral  PainSc:    Pain Goal:                   Jacquelynn Friend
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
## 2024-04-30 NOTE — Patient Instructions (Signed)
 If interested in an outpatient lactation consult in office or virtually please reach out to us  at [facility] (First Floor) [address] [place] Please call [phone] and press [phone] for lactation.    Melodi Sprung, [facility]

## 2024-04-30 NOTE — Progress Notes (Signed)
 Post Partum Day 1 Subjective: no complaints, up ad lib, voiding, tolerating PO, and + flatus  Objective: Blood pressure (!) 123/51, pulse 60, temperature (!) 97.5 F (36.4 C), temperature source Axillary, resp. rate 17, height 5' 4 (1.626 m), weight 111.6 kg, last menstrual period 07/13/2023, SpO2 100%, unknown if currently breastfeeding.  Physical Exam:  General: alert, cooperative, appears stated age, and no distress Lochia: appropriate Uterine Fundus: firm Incision: N/A  DVT Evaluation: No evidence of DVT seen on physical exam. No significant calf/ankle edema.  Recent Labs    04/29/24 0847 04/30/24 0226  HGB 12.1 10.6*  HCT 37.0 33.5*    Assessment/Plan: Plan for discharge tomorrow and Contraception interval Tubal    LOS: 1 day   Erika Klein, CNM 04/30/2024, 12:55 PM

## 2024-05-01 ENCOUNTER — Other Ambulatory Visit (HOSPITAL_COMMUNITY): Payer: Self-pay

## 2024-05-01 LAB — SURGICAL PATHOLOGY

## 2024-05-01 MED ORDER — IBUPROFEN 600 MG PO TABS
600.0000 mg | ORAL_TABLET | Freq: Four times a day (QID) | ORAL | 0 refills | Status: DC
  Filled 2024-05-01: qty 30, 8d supply, fill #0

## 2024-05-01 MED ORDER — BENZOCAINE-MENTHOL 20-0.5 % EX AERO
1.0000 | INHALATION_SPRAY | CUTANEOUS | 3 refills | Status: DC | PRN
  Filled 2024-05-01: qty 78, fill #0

## 2024-05-01 MED ORDER — SENNOSIDES-DOCUSATE SODIUM 8.6-50 MG PO TABS
2.0000 | ORAL_TABLET | Freq: Every day | ORAL | 0 refills | Status: DC
  Filled 2024-05-01: qty 60, 30d supply, fill #0

## 2024-05-11 ENCOUNTER — Telehealth (HOSPITAL_COMMUNITY): Payer: Self-pay | Admitting: *Deleted

## 2024-05-11 NOTE — Telephone Encounter (Signed)
 05/11/2024  Name: Erika Klein MRN: [id number] DOB: [birth year]  Reason for Call:  Transition of Care Hospital Discharge Call  Contact Status: Patient Contact Status: Message  Language assistant needed:          Follow-Up Questions:    Van Postnatal Depression Scale:  In the Past 7 Days:    PHQ2-9 Depression Scale:     Discharge Follow-up:    Post-discharge interventions: NA  Mliss Sieve, RN 05/11/2024 14:20

## 2024-05-11 NOTE — Telephone Encounter (Unsigned)
 Copied from CRM [id number]. Topic: Clinical - Order For Equipment >> May 11, 2024 12:49 PM Silvana PARAS wrote: Reason for CRM: Pt calling to f/u on breast pump to be picked up at the pharmacy.

## 2024-05-12 ENCOUNTER — Telehealth: Admitting: Family Medicine

## 2024-05-12 DIAGNOSIS — M545 Low back pain, unspecified: Secondary | ICD-10-CM

## 2024-05-12 NOTE — Progress Notes (Signed)
  Because you recently just gave birth, your condition warrants further evaluation and recommend that you be seen in a face-to-face visit.   NOTE: There will be NO CHARGE for this E-Visit   If you are having a true medical emergency, please call 911.     For an urgent face to face visit, [place] has multiple urgent care centers for your convenience.  Click the link below for the full list of locations and hours, walk-in wait times, appointment scheduling options and driving directions:  Urgent Care - [place], [place], [place], [place], [place], [place]  [place]     Your MyChart E-visit questionnaire answers were reviewed by a board certified advanced clinical practitioner to complete your personal care plan based on your specific symptoms.    Thank you for using e-Visits.

## 2024-05-14 ENCOUNTER — Ambulatory Visit: Admitting: Obstetrics

## 2024-05-14 NOTE — Progress Notes (Deleted)
 [facility] Return Visit  SUBJECTIVE  History of Present Illness: Erika Klein is a 28 y.o. H4E5985 female seen in follow-up for possible fecal smearing, constipation, pelvic and perineal pain, and urinary frequency. Plan at last visit was pelvic floor PT, fiber supplementation, topical lidocaine , titration of fiber supplementation.    S/p SVD on 04/29/24 after induction at [redacted]w[redacted]d for IUGR, delivered 2438g with periurethral laceration.   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   Past Surgical History: She  has no past surgical history on file.   Medications: She has a current medication list which includes the following prescription(s): benzocaine -menthol , accrufer , ibuprofen , prenatal gummies, and senna-docusate.   Allergies: Patient has no known allergies.   Social History: Patient  reports that she has never smoked. She has never used smokeless tobacco. She reports that she does not currently use alcohol. She reports that she does not use drugs.     OBJECTIVE     Physical Exam: There were no vitals filed for this visit. Gen: No apparent distress, A&O x 3.  Detailed Urogynecologic Evaluation:  Deferred. Prior exam showed:      No data to display             ASSESSMENT AND PLAN    Erika Klein is a 28 y.o. with:  No diagnosis found.  There are no diagnoses linked to this encounter.   Erika ONEIDA Gillis, MD

## 2024-05-23 ENCOUNTER — Other Ambulatory Visit: Payer: Self-pay | Admitting: Family Medicine

## 2024-05-23 ENCOUNTER — Inpatient Hospital Stay (HOSPITAL_COMMUNITY)
Admission: AD | Admit: 2024-05-23 | Discharge: 2024-05-23 | Disposition: A | Discharge status: Home / Self Care | Attending: Obstetrics & Gynecology | Admitting: Obstetrics & Gynecology

## 2024-05-23 DIAGNOSIS — B9689 Other specified bacterial agents as the cause of diseases classified elsewhere: Secondary | ICD-10-CM | POA: Insufficient documentation

## 2024-05-23 DIAGNOSIS — N898 Other specified noninflammatory disorders of vagina: Secondary | ICD-10-CM | POA: Insufficient documentation

## 2024-05-23 LAB — WET PREP, GENITAL
Sperm: NONE SEEN
Trich, Wet Prep: NONE SEEN
WBC, Wet Prep HPF POC: <10 (ref <10)
Yeast Wet Prep HPF POC: NONE SEEN

## 2024-05-23 LAB — POCT PREGNANCY, URINE: Preg Test, Ur: NEGATIVE

## 2024-05-23 MED ORDER — METRONIDAZOLE 500 MG PO TABS: 500.0000 mg | ORAL_TABLET | Freq: Two times a day (BID) | ORAL | 0 refills | Status: DC

## 2024-05-23 NOTE — MAU Provider Note (Signed)
 Vaginal Odor   [name] is a 28 y.o. H4E5985 pregnant female at Unknown who presents to MAU today with complaint of vaginal odor. 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  Pt states for last couple of days she's noticed new vaginal odor.  Pt states has been having unprotected sex since delivery. Denies discharge.   Receives care at [facility]. Prenatal records reviewed.  Pertinent items noted in HPI and remainder of comprehensive ROS otherwise negative.   O BP 126/85 (BP Location: Right Arm)   Pulse 71   Temp 97.8 F (36.6 C) (Oral)   Resp 14   Wt 107.3 kg   SpO2 100%   Breastfeeding No   BMI 40.61 kg/m  Physical Exam Constitutional:      General: She is not in acute distress.    Appearance: Normal appearance. She is obese. She is not ill-appearing.  HENT:     Head: Normocephalic and atraumatic.     Right Ear: External ear normal.     Left Ear: External ear normal.     Nose: Nose normal.     Mouth/Throat:     Mouth: Mucous membranes are moist.     Pharynx: Oropharynx is clear.  Eyes:     Extraocular Movements: Extraocular movements intact.     Conjunctiva/sclera: Conjunctivae normal.  Cardiovascular:     Rate and Rhythm: Normal rate.  Pulmonary:     Effort: Pulmonary effort is normal.     Breath sounds: Normal breath sounds.  Abdominal:     General: Abdomen is flat. There is no distension.     Palpations: Abdomen is soft.  Musculoskeletal:        General: No swelling. Normal range of motion.     Cervical back: Normal range of motion.  Skin:    General: Skin is warm and dry.  Neurological:     Mental Status: She is alert and oriented to person, place, and time. Mental status is at baseline.  Psychiatric:        Mood and Affect: Mood normal.        Behavior: Behavior normal.      MDM: MAU Course:  Pt had to leave prior to results being available but were as follows:  GC collected Wet Prep showing Clue Cells Upreg  negative  Sending in Flagyl  for patient.  Pt notified via MyChart.   AP #Post SVD #BV  -sent flagyl   Discharge from MAU in stable condition with strict/usual precautions Follow up at desired OBGYN as scheduled for ongoing prenatal care  Allergies as of 05/23/2024   No Known Allergies      Medication List     TAKE these medications    ACCRUFeR  30 MG Caps Generic drug: Ferric Maltol  Take 1 capsule (30 mg total) by mouth daily.   benzocaine -Menthol  20-0.5 % Aero Commonly known as: DERMOPLAST Apply 1 Application topically as needed for irritation (perineal discomfort).   ibuprofen  600 MG tablet Commonly known as: ADVIL  Take 1 tablet (600 mg total) by mouth every 6 (six) hours.   Prenatal Gummies 0.18-25 MG Chew Take 2 gummies by mouth every day   Senna-S 8.6-50 MG tablet Generic drug: senna-docusate Take 2 tablets by mouth daily.        Erika Augustin BROCKS, MD 05/23/2024 5:32 PM

## 2024-05-23 NOTE — Progress Notes (Signed)
 Swab positive for clue cells in MAU earlier today.   Sent Flagyl  treatment.   Augustin Slade, MD Family Medicine - Obstetrics Fellow

## 2024-05-23 NOTE — MAU Note (Signed)
.  Erika Klein is a 28 y.o. SVD on 04/29/24 here in MAU reporting: has had malodorous vaginal discharge for x1 week. She has had unprotected vaginal intercourse a few times since she came home from the hospital. Would like to be tested for STIs. Denies abdominal pain.   Pain score: 0 Vitals:   05/23/24 1347  BP: 126/85  Pulse: 71  Resp: 14  Temp: 97.8 F (36.6 C)  SpO2: 100%      Lab orders placed from triage:   Wet prep, GC

## 2024-05-23 NOTE — MAU Note (Signed)
Patient requesting to leave, provider notified.

## 2024-05-25 LAB — GC/CHLAMYDIA PROBE AMP (~~LOC~~) NOT AT ARMC
Chlamydia: NEGATIVE
Comment: NEGATIVE
Comment: NORMAL
Neisseria Gonorrhea: NEGATIVE

## 2024-05-26 ENCOUNTER — Encounter: Admitting: Physical Therapy

## 2024-05-28 ENCOUNTER — Ambulatory Visit

## 2024-06-11 ENCOUNTER — Ambulatory Visit: Payer: Self-pay | Admitting: Obstetrics & Gynecology

## 2024-06-11 ENCOUNTER — Encounter: Payer: Self-pay | Admitting: Obstetrics & Gynecology

## 2024-06-11 ENCOUNTER — Other Ambulatory Visit: Payer: Self-pay

## 2024-06-11 ENCOUNTER — Other Ambulatory Visit (HOSPITAL_COMMUNITY)
Admission: RE | Admit: 2024-06-11 | Discharge: 2024-06-11 | Disposition: A | Discharge status: Home / Self Care | Source: Ambulatory Visit | Attending: Obstetrics & Gynecology | Admitting: Obstetrics & Gynecology

## 2024-06-11 ENCOUNTER — Ambulatory Visit (INDEPENDENT_AMBULATORY_CARE_PROVIDER_SITE_OTHER): Admitting: Obstetrics & Gynecology

## 2024-06-11 DIAGNOSIS — Z3202 Encounter for pregnancy test, result negative: Secondary | ICD-10-CM

## 2024-06-11 DIAGNOSIS — Z30011 Encounter for initial prescription of contraceptive pills: Secondary | ICD-10-CM

## 2024-06-11 DIAGNOSIS — D563 Thalassemia minor: Secondary | ICD-10-CM

## 2024-06-11 DIAGNOSIS — F418 Other specified anxiety disorders: Secondary | ICD-10-CM

## 2024-06-11 LAB — POCT PREGNANCY, URINE: Preg Test, Ur: NEGATIVE

## 2024-06-11 MED ORDER — DROSPIRENONE-ETHINYL ESTRADIOL 3-0.02 MG PO TABS: 1.0000 | ORAL_TABLET | Freq: Every day | ORAL | 11 refills | Status: DC

## 2024-06-11 NOTE — Progress Notes (Addendum)
 Post Partum Visit Note  Erika Klein is a 28 y.o. [id number] female who presents for a postpartum visit. She is 6.1 weeks postpartum following a normal spontaneous vaginal delivery.  I have fully reviewed the prenatal and intrapartum course. The delivery was at 37.6 gestational weeks.  Anesthesia: epidural. Postpartum course has been same. Baby is doing well. Baby is feeding by bottle - Similac Neosure. Bleeding no bleeding. Bowel function is normal. Bladder function is normal. Patient is sexually active, want testing for vaginitis and STIs via swab (no blood). Contraception method is none. Postpartum depression screening: negative.   The pregnancy intention screening data noted above was reviewed. Potential methods of contraception were discussed. The patient elected to proceed with OCPs.  Edinburgh Postnatal Depression Scale - 06/11/24 1047       Edinburgh Postnatal Depression Scale:  In the Past 7 Days   I have been able to laugh and see the funny side of things. 0    I have looked forward with enjoyment to things. 0    I have blamed myself unnecessarily when things went wrong. 3    I have been anxious or worried for no good reason. 0    I have felt scared or panicky for no good reason. 0    Things have been getting on top of me. 1    I have been so unhappy that I have had difficulty sleeping. 3    I have been so unhappy that I have been crying. 3    The thought of harming myself has occurred to me. 0          Health Maintenance Due  Topic Date Due   HPV VACCINES (1 - 3-dose series) Never done   DTaP/Tdap/Td (1 - Tdap) Never done   Hepatitis B Vaccines (1 of 3 - 19+ 3-dose series) Never done   COVID-19 Vaccine (1 - 2024-25 season) Never done   INFLUENZA VACCINE  06/05/2024    The following portions of the patient's history were reviewed and updated as appropriate: allergies, current medications, past family history, past medical history, past social history, past surgical  history, and problem list.  Review of Systems Pertinent items noted in HPI and remainder of comprehensive ROS otherwise negative.  Objective:  BP 122/86   Pulse 72   Wt 231 lb (104.8 kg)   LMP 07/13/2023   Breastfeeding No   BMI 39.65 kg/m    General:  alert and no distress   Breasts:  not indicated  Lungs: clear to auscultation bilaterally  Heart:  regularly irregular rhythm  Abdomen: soft, non-tender; bowel sounds normal; no masses,  no organomegaly   GU exam:  not indicated       Assessment:   Normal postpartum exam.   Plan:   Essential components of care per ACOG recommendations:  1.  Mood and well being: Patient with negative depression screening today but patient  has history of Depression and Anxiety Patient desires Trauma Counselor. Patient consented to [facility] services about presenting concerns, referral placed.  Reviewed local resources for support.  - Patient tobacco use? No.   - hx of drug use? No.    2. Infant care and feeding:  -Patient currently breastmilk feeding? No.  -Social determinants of health (SDOH) reviewed in EPIC. Food insecurity noted, she was taken to [employer] at end of appointment.  3. Sexuality, contraception and birth spacing - Patient does not want a pregnancy in the next year.   -  Discussed birth spacing of 18 months - Reviewed reproductive life planning. Reviewed contraceptive methods based on pt preferences and effectiveness.  Patient desired Oral Contraceptive today.  Yaz prescribed.  The use of the oral contraceptive has been fully discussed with the patient. This includes the proper method to initiate (i.e. Sunday start after next normal menstrual onset versus same day start) and continue the pills, the need for regular compliance to ensure adequate contraceptive effect, the physiology which make the pill effective, the instructions for what to do in event of a missed pill, and warnings about anticipated minor  side effects such as breakthrough spotting, nausea, breast tenderness, weight changes, acne, headaches, etc.  She has been told of the more serious potential side effects such as MI, stroke, and deep vein thrombosis, all of which are very unlikely.  She has been asked to report any signs of such serious problems immediately.  She should back up the pill with a condom during any cycle in which antibiotics are prescribed, and during the first cycle as well. The need for additional protection, such as a condom, to prevent exposure to sexually transmitted diseases has also been discussed- the patient has been clearly reminded that OCP's cannot protect her against diseases such as HIV and others. She understands and wishes to take the medication as prescribed.She was given a prescription for Yaz and will return in 6 weeks for BP and contraceptive check.  4. Sleep and fatigue -Encouraged family/partner/community support of 4 hrs of uninterrupted sleep to help with mood and fatigue  5. Physical Recovery  - Discussed patients delivery and complications. She describes her labor as good. - Patient had a Vaginal, no problems at delivery. Patient had a periurethral laceration. Perineal healing reviewed. Patient expressed understanding - Patient has urinary incontinence? No. - Patient is safe to resume physical and sexual activity  6.  Health Maintenance - HM due items addressed Yes - Last pap smear  Diagnosis  Date Value Ref Range Status  11/20/2023   Final   - Negative for Intraepithelial Lesions or Malignancy (NILM)  11/20/2023 - Benign reactive/reparative changes  Final   Pap smear not done at today's visit.  -Breast Cancer screening indicated? No.     Tecumseh Yeagley, MD Center for [facility], [facility]

## 2024-06-12 LAB — CERVICOVAGINAL ANCILLARY ONLY
Bacterial Vaginitis (gardnerella): POSITIVE — AB
Candida Glabrata: NEGATIVE
Candida Vaginitis: NEGATIVE
Chlamydia: NEGATIVE
Comment: NEGATIVE
Comment: NEGATIVE
Comment: NEGATIVE
Comment: NEGATIVE
Comment: NEGATIVE
Comment: NORMAL
Neisseria Gonorrhea: NEGATIVE
Trichomonas: NEGATIVE

## 2024-06-15 ENCOUNTER — Other Ambulatory Visit: Payer: Self-pay

## 2024-06-15 DIAGNOSIS — B9689 Other specified bacterial agents as the cause of diseases classified elsewhere: Secondary | ICD-10-CM

## 2024-06-15 MED ORDER — METRONIDAZOLE 500 MG PO TABS
500.0000 mg | ORAL_TABLET | Freq: Two times a day (BID) | ORAL | 0 refills | Status: DC
Start: 2024-06-15 — End: 2024-08-03

## 2024-06-18 ENCOUNTER — Ambulatory Visit: Admitting: Clinical

## 2024-06-18 DIAGNOSIS — Z91199 Patient's noncompliance with other medical treatment and regimen due to unspecified reason: Secondary | ICD-10-CM

## 2024-06-18 NOTE — BH Specialist Note (Signed)
 Pt did not arrive to video visit and did not answer the phone; Left HIPPA-compliant message to call back Warren from [employer] for [employer] at [facility] at  [phone] [facility] office).  ?; left MyChart message for patient.  ? ?

## 2024-07-09 ENCOUNTER — Ambulatory Visit: Admitting: Obstetrics

## 2024-07-09 ENCOUNTER — Other Ambulatory Visit: Payer: Self-pay | Admitting: Certified Nurse Midwife

## 2024-07-09 NOTE — Progress Notes (Deleted)
 [facility] Return Visit  SUBJECTIVE  History of Present Illness: Erika Klein is a 28 y.o. female seen in follow-up for fecal smearing, urinary frequency, pelvic pain, and constipation. Plan at last visit was referral to .     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   Past Surgical History: She  has no past surgical history on file.   Medications: She has a current medication list which includes the following prescription(s): benzocaine -menthol , drospirenone -ethinyl estradiol , accrufer , ibuprofen , metronidazole , prenatal gummies, and senna-docusate.   Allergies: Patient has no known allergies.   Social History: Patient  reports that she has never smoked. She has never used smokeless tobacco. She reports that she does not currently use alcohol. She reports that she does not use drugs.     OBJECTIVE     Physical Exam: There were no vitals filed for this visit. Gen: No apparent distress, A&O x 3.  Detailed Urogynecologic Evaluation:  Deferred. Prior exam showed:      No data to display             ASSESSMENT AND PLAN    Erika Klein is a 28 y.o. with:  No diagnosis found.  There are no diagnoses linked to this encounter.   Erika ONEIDA Gillis, MD

## 2024-07-14 NOTE — BH Specialist Note (Unsigned)
 Integrated Behavioral Health via Telemedicine Visit  07/14/2024 Erika Klein [id number]  Number of Integrated Behavioral Health Clinician visits: No data recorded Session Start time: No data recorded  Session End time: No data recorded Total time in minutes: No data recorded   Referring Provider: Gloris Hugger, MD Patient/Family location: Home*** [facility] Provider location: Center for [facility] at [facility]  All persons participating in visit: Patient Erika Klein and [facility] Erika Klein ***  Types of Service: {CHL AMB TYPE OF SERVICE:[phone]}  I connected with Erika Klein and/or Erika Klein's {family members:20773} via  Telephone or Video Enabled Telemedicine Application  (Video is Caregility application) and verified that I am speaking with the correct person using two identifiers. Discussed confidentiality: Yes   I discussed the limitations of telemedicine and the availability of in person appointments.  Discussed there is a possibility of technology failure and discussed alternative modes of communication if that failure occurs.  I discussed that engaging in this telemedicine visit, they consent to the provision of behavioral healthcare and the services will be billed under their insurance.  Patient and/or legal guardian expressed understanding and consented to Telemedicine visit: Yes   Presenting Concerns: Patient and/or family reports the following symptoms/concerns: *** Duration of problem: ***; Severity of problem: {Mild/Moderate/Severe:20260}  Patient and/or Family's Strengths/Protective Factors: {CHL AMB BH PROTECTIVE [phone]}  Goals Addressed: Patient will:  Reduce symptoms of: {IBH Symptoms:21014056}   Increase knowledge and/or ability of: {IBH Patient Tools:21014057}   Demonstrate ability to: {IBH Goals:21014053}  Progress towards Goals: Ongoing    Interventions: Interventions utilized:  {IBH  Interventions:21014054} Standardized Assessments completed: {IBH Screening Tools:21014051}    Patient and/or Family Response: Patient agrees with treatment plan.   Clinical Assessment/Diagnosis  No diagnosis found. .   Patient may benefit from psychoeducation and brief therapeutic interventions regarding coping with symptoms of ***   Plan: Follow up with behavioral health clinician on : *** Behavioral recommendations:  -*** -*** Referral(s): {IBH Referrals:21014055}  I discussed the assessment and treatment plan with the patient and/or parent/guardian. They were provided an opportunity to ask questions and all were answered. They agreed with the plan and demonstrated an understanding of the instructions.   They were advised to call back or seek an in-person evaluation if the symptoms worsen or if the condition fails to improve as anticipated.  Warren BROCKS Alyjah Lovingood, LCSW     11/20/2023    2:42 PM 02/26/2023    1:31 PM 11/26/2022   11:03 AM 07/23/2022   11:18 AM 07/16/2022    5:21 PM  Depression screen PHQ 2/9  Decreased Interest 0 0  0 0  Down, Depressed, Hopeless 2 2 0 1 3  PHQ - 2 Score 2 2 0 1 3  Altered sleeping 0 3 1 1 1   Tired, decreased energy 1 3 0 1 0  Change in appetite 0 0 0 0 0  Feeling bad or failure about yourself  2 1 0 0 0  Trouble concentrating 0 0 0 0 0  Moving slowly or fidgety/restless 0 0 0 0 0  Suicidal thoughts 0 0 0 0 0  PHQ-9 Score 5 9 1 3 4       11/20/2023    2:42 PM 02/26/2023    1:33 PM 11/26/2022   11:03 AM 07/23/2022   11:18 AM  GAD 7 : Generalized Anxiety Score  Nervous, Anxious, on Edge 0 3 3 0  Control/stop worrying 0 0 0 0  Worry too much - different  things 2 1 2  0  Trouble relaxing 0 1 0 0  Restless 0 0 0 0  Easily annoyed or irritable 0 1 0 0  Afraid - awful might happen 2 0 0 0  Total GAD 7 Score 4 6 5  0

## 2024-07-15 ENCOUNTER — Ambulatory Visit (INDEPENDENT_AMBULATORY_CARE_PROVIDER_SITE_OTHER): Admitting: Clinical

## 2024-07-15 DIAGNOSIS — F411 Generalized anxiety disorder: Secondary | ICD-10-CM

## 2024-07-15 DIAGNOSIS — Z658 Other specified problems related to psychosocial circumstances: Secondary | ICD-10-CM

## 2024-07-15 NOTE — Patient Instructions (Addendum)
 Center for [facility] at [facility] [address] [place], [place] [address] [phone] (main office) [phone] [facility] office)  Additional possible options for Trauma-focused therapy:    Psychology Today [email or web address]  [facility], [address] [place], [place]  [phone] Fax: [phone] [email or web address] *Interpreters available *Accepts all insurance and uninsured for Urgent Care needs *Accepts Medicaid and uninsured for outpatient treatment   [facility]   Mon-Fri: 8am-5pm [address], [place], [place] [phone](eynwz); [phone](fax) [email or web address]  *Accepts Medicare  [facility] Earlean Ever[place], Fri: 8am-4pm [address], [place], [place] [address] [phone] (phone); [phone] (fax) [email or web address]  *Accepts Medicare  [facility] Mon-Fri: 9am-5pm  [address] [place], [place] [phone] (phone); [phone]  [email or web address]  *Accepts Medicaid  [facility] [place], 8:30am-12pm/1pm-2:30pm [address] [place], [place] [phone] (phone); [phone] (fax) [email or web address].[email or web address]  *Accepts Medicaid, sliding-scale*Bilingual services available  [facility] Mon-Fri, 8am-7pm [address] [place], [place]  [phone](eynwz); [phone](fax) www.[email or web address]  *Accepts Medicaid *Bilingual services available  [facility] Mon-Fri: 8am-5pm, Saturday by appointment only [address] [place], [place], [place] [phone] (phone); [phone] (fax) [email or web address]   [facility] [address], [place], [place] [phone] [email or web address]  *Free & reduced services for uninsured and underinsured  individuals *Bilingual services for Spanish-speaking clients 21 and under  [facility] Mon-Fri: 9am-5:30pm [address], [place], [place] [phone] (phone), [phone] (fax) After hours crisis line: [phone] [email or web address]  *Accepts Medicare and Medicaid  [employer], 8am-6pm [address] [place], [place]  [phone] (phone); [phone] (fax) [email or web address]://[email or web address]  *Subsidized costs available  [facility] Mon-Fri: 8am-4pm [address] [place], [place] [phone](eynwz); [phone](fax) www.[email or web address]  *Accepts Medicaid  [facility] Same day access hours: Mon-Fri, 8:30-3pm Crisis hours: Mon-Fri, 8am-5pm [address], [place], [place] [phone]  [facility] Same day access hours: Mon-Fri, 8:30-3pm Crisis hours: Mon-Fri, 8am-8pm [address] [place], [place] [phone] (phone); [phone] (fax) www.[email or web address]  *Accepts Medicaid and Medicare  [facility] [place], [place], Fri: 9am-9pm Tues, Thurs: 9am-6pm [address] [place] [address] [place], [place], [place]  [phone] (phone); [phone] (fax) [email or web address]  *(Accepts Medicare and Medicaid; payment plans available)*Bilingual services available  [facility] [address], [place], [place] [phone] (phone); [phone] (fax) [email or web address]   [facility] [address], [place], [place]  [phone]  [email or web address]  *Bilingual services available  [facility] Mon-Thurs: 8am-8pm [address], [place], [place] [phone] (phone); [phone] (fax) [email or web address]  *Accepts Medicaid*Bilingual services available  [facility] [address] [place],  [place] [phone] (phone) [email or web address]  *Accepts Medicaid *Bilingual services available  [facility] Mon-Fri, 9am-4:45pm [address], [place], [place] [phone] (phone); [phone] (fax) [email or web address]://[email or web address]  *Accepts Medicare  [facility] Mon-Thurs: 8:30-8pm, Fri: 8:30am-7pm [address] [place], [place] (3rd floor) [phone] (phone); [phone] (fax) [email or web address]  *Accepts Medicaid; income-based reduced rates available  [facility] Mon-Fri: 8am-5pm [address], [place], [place] [address] [phone] (phone); [phone] (fax) [email or web address]://[email or web address]  *Accepts Medicaid*Bilingual services available   [facility] [place])  [address], [place] [phone] www.[email or web address]  *Provides direct services to individuals in recovery from mental illness, including support groups, recovery skills classes, and one on one peer support  [employer] on Mental Illness) [place] helpline: (617)650-4023  [facility] [place] helpline: (343)642-8123 https://namiguilford.org  *A community hub for  information relating to local resources and services for the friends and families of individuals living alongside a mental health condition, as well as the individuals themselves. Classes and support groups also provided   [facility]  [address], [place], [place] [address] [phone] or [phone] [facility] 24/7 FOR ANY[facility] [address], [place], [place]  [phone] Fax: [phone] [email or web address] *Interpreters available *Accepts all insurance and uninsured for Urgent Care needs *Accepts Medicaid and uninsured for outpatient treatment (below)    ONLY FOR [facility]  Below:   Outpatient New Patient Assessment/Therapy Walk-ins:        Monday -Thursday 8am until slots are full.         Every Friday 1pm-4pm  (first come, first served)                   New Patient Psychiatry/Medication Management        Monday-Friday 8am-11am (first come, first served)              For all walk-ins we ask that you arrive by 7:15am, because patients will be seen in the order of arrival.   [facility]  [address] [place], [place] [address] [phone] [email or web address]  [facility]:  [address], 2nd floor, [place], [place] [address] [phone])  Main line [phone]  [place] location **Parking is available in the [address] parking deck.  [place]:  [address], [place], [place] [address] [phone]) Main line [phone]  [place] location  **Located on the backside of the [facility]. Limited parking is available off of [address]  [place] hours: Monday -Friday 8:30am-4:30pm  [email or web address]    If immediate emergency, call 9-1-1, or [facility] [place] 24 hour crisis hotline at: 704-881-6502  [facility] [address], [place], [place] [address] [phone] **temporary housing for victims of domestic violence  [place]:  [facility]  [address], [place], [place] [address] [phone]  http://[email or web address]  Monday-Friday, 8:30am-5:00pm

## 2024-07-23 ENCOUNTER — Ambulatory Visit

## 2024-08-03 ENCOUNTER — Other Ambulatory Visit: Payer: Self-pay

## 2024-08-03 ENCOUNTER — Ambulatory Visit: Admitting: *Deleted

## 2024-08-03 ENCOUNTER — Other Ambulatory Visit (HOSPITAL_COMMUNITY)
Admission: RE | Admit: 2024-08-03 | Discharge: 2024-08-03 | Disposition: A | Discharge status: Home / Self Care | Source: Ambulatory Visit | Attending: Family Medicine | Admitting: Family Medicine

## 2024-08-03 VITALS — BP 120/72 | HR 80 | Ht 63.0 in | Wt 235.5 lb

## 2024-08-03 DIAGNOSIS — N898 Other specified noninflammatory disorders of vagina: Secondary | ICD-10-CM | POA: Diagnosis present

## 2024-08-03 DIAGNOSIS — Z3200 Encounter for pregnancy test, result unknown: Secondary | ICD-10-CM

## 2024-08-03 DIAGNOSIS — Z3201 Encounter for pregnancy test, result positive: Secondary | ICD-10-CM

## 2024-08-03 DIAGNOSIS — Z0130 Encounter for examination of blood pressure without abnormal findings: Secondary | ICD-10-CM

## 2024-08-03 LAB — POCT PREGNANCY, URINE: Preg Test, Ur: POSITIVE — AB

## 2024-08-03 NOTE — Progress Notes (Signed)
 Pt presents for BP check following initiation of OCP's on 06/11/24 @ PP visit. She recently had vaginal delivery on 04/29/24. BP - 120/72, P - 80. She denies H/A or visual disturbances. Pt reports that she initiated OCP's on approximately 06/14/24. She had unprotected sex one week later. She reports having a normal period lasting 5 days which started on 8/25. Pt then missed one OCP on 9/2 and took 2 pills on 9/3. On 9/23 she took UPT @ home and states that there was a faint line on the test causing her to believe she is pregnant. She stopped taking OCP's on that Jcion Buddenhagen. UPT is positive in office today and pt was informed. Pt denies having vaginal bleeding or abdominal pain. She was advised to go to [facility]U if these sx arise. With LMP of 06/29/24, pt has EDD 04/05/25, now [redacted]w[redacted]d. Per consult with Dr. Fredirick, pt was scheduled for ultrasound to assess viability on 08/20/24. She will be scheduled for prenatal care following US  and confirmation of dating. Pt stated she has been having vaginal odor - self swab was obtained. She will be notified of results via Mychart. Pt voiced understanding.

## 2024-08-04 LAB — CERVICOVAGINAL ANCILLARY ONLY
Bacterial Vaginitis (gardnerella): POSITIVE — AB
Candida Glabrata: NEGATIVE
Candida Vaginitis: NEGATIVE
Chlamydia: NEGATIVE
Comment: NEGATIVE
Comment: NEGATIVE
Comment: NEGATIVE
Comment: NEGATIVE
Comment: NEGATIVE
Comment: NORMAL
Neisseria Gonorrhea: NEGATIVE
Trichomonas: NEGATIVE

## 2024-08-05 ENCOUNTER — Ambulatory Visit: Payer: Self-pay | Admitting: Family Medicine

## 2024-08-05 MED ORDER — CLINDAMYCIN PHOSPHATE 2 % VA CREA
1.0000 | TOPICAL_CREAM | Freq: Every day | VAGINAL | 0 refills | Status: DC
Start: 2024-08-05 — End: 2024-09-21

## 2024-08-20 ENCOUNTER — Other Ambulatory Visit: Payer: Self-pay | Admitting: Family Medicine

## 2024-08-20 ENCOUNTER — Other Ambulatory Visit (INDEPENDENT_AMBULATORY_CARE_PROVIDER_SITE_OTHER)

## 2024-08-20 ENCOUNTER — Other Ambulatory Visit: Payer: Self-pay

## 2024-08-20 DIAGNOSIS — Z3A01 Less than 8 weeks gestation of pregnancy: Secondary | ICD-10-CM

## 2024-08-20 DIAGNOSIS — O3680X0 Pregnancy with inconclusive fetal viability, not applicable or unspecified: Secondary | ICD-10-CM

## 2024-08-20 DIAGNOSIS — Z3491 Encounter for supervision of normal pregnancy, unspecified, first trimester: Secondary | ICD-10-CM

## 2024-08-21 ENCOUNTER — Ambulatory Visit: Payer: Self-pay | Admitting: Family Medicine

## 2024-08-21 ENCOUNTER — Encounter: Payer: Self-pay | Admitting: Family Medicine

## 2024-08-28 ENCOUNTER — Inpatient Hospital Stay (HOSPITAL_COMMUNITY)
Admission: AD | Admit: 2024-08-28 | Discharge: 2024-08-28 | Disposition: A | Discharge status: Home / Self Care | Attending: Obstetrics and Gynecology | Admitting: Obstetrics and Gynecology

## 2024-08-28 ENCOUNTER — Encounter (HOSPITAL_COMMUNITY): Payer: Self-pay | Admitting: Obstetrics and Gynecology

## 2024-08-28 DIAGNOSIS — Z3A01 Less than 8 weeks gestation of pregnancy: Secondary | ICD-10-CM | POA: Diagnosis not present

## 2024-08-28 DIAGNOSIS — O99511 Diseases of the respiratory system complicating pregnancy, first trimester: Secondary | ICD-10-CM

## 2024-08-28 DIAGNOSIS — J069 Acute upper respiratory infection, unspecified: Secondary | ICD-10-CM

## 2024-08-28 DIAGNOSIS — J029 Acute pharyngitis, unspecified: Secondary | ICD-10-CM | POA: Diagnosis present

## 2024-08-28 DIAGNOSIS — R059 Cough, unspecified: Secondary | ICD-10-CM | POA: Diagnosis not present

## 2024-08-28 DIAGNOSIS — R0609 Other forms of dyspnea: Secondary | ICD-10-CM | POA: Diagnosis present

## 2024-08-28 LAB — RESP PANEL BY RT-PCR (RSV, FLU A&B, COVID)  RVPGX2
Influenza A by PCR: NEGATIVE
Influenza B by PCR: NEGATIVE
Resp Syncytial Virus by PCR: NEGATIVE
SARS Coronavirus 2 by RT PCR: NEGATIVE

## 2024-08-28 MED ORDER — ALBUTEROL SULFATE HFA 108 (90 BASE) MCG/ACT IN AERS: 1.0000 | INHALATION_SPRAY | RESPIRATORY_TRACT | 3 refills | Status: AC | PRN

## 2024-08-28 MED ORDER — ALBUTEROL SULFATE (2.5 MG/3ML) 0.083% IN NEBU
2.5000 mg | INHALATION_SOLUTION | Freq: Once | RESPIRATORY_TRACT | Status: AC
  Administered 2024-08-28: 2.5 mg via RESPIRATORY_TRACT
  Filled 2024-08-28: qty 3

## 2024-08-28 MED ORDER — ALBUTEROL SULFATE HFA 108 (90 BASE) MCG/ACT IN AERS
1.0000 | INHALATION_SPRAY | RESPIRATORY_TRACT | Status: DC | PRN
  Administered 2024-08-28: 1 via RESPIRATORY_TRACT
  Filled 2024-08-28: qty 6.7

## 2024-08-28 NOTE — MAU Note (Signed)
 Respiratory Therapy called for pt's breathing treatment

## 2024-08-28 NOTE — MAU Note (Signed)
 Respiratory Therapy in to do breathing tx

## 2024-08-28 NOTE — MAU Provider Note (Signed)
 Chief Complaint:  Sore Throat   HPI   None     Erika Klein is a 28 y.o. [id number] at [redacted]w[redacted]d who presents to maternity admissions reporting sore throat for one week and shortness of breath since yesterday. States that everyone in the house is sick with a cold since last week. No one in the house was tested. Also having runny nose and non-productive cough.Denies fever, chills.   Pregnancy Course: Follows for her pregnancy at [facility]  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 IUGR  1 SAB 09/25/17           History reviewed. No pertinent surgical history. Family History  Problem Relation Age of Onset   Schizophrenia Mother    Bipolar disorder Mother    Anxiety disorder Mother    Uterine cancer Mother    Anxiety disorder Father    Asthma Father    Hypertension Maternal Grandmother    Stroke Paternal Grandmother    Hypertension Paternal Grandmother    Hypertension Maternal Aunt    Diabetes Neg Hx    Heart disease Neg Hx    Bladder Cancer Neg Hx    Renal cancer Neg Hx    Social History   Tobacco Use   Smoking status: Never   Smokeless tobacco: Never  Vaping Use   Vaping status: Never Used  Substance Use Topics   Alcohol use: Not Currently   Drug use: Never   No Known Allergies No medications prior to admission.    I have  reviewed patient's Past Medical Hx, Surgical Hx, Family Hx, Social Hx, medications and allergies.   ROS  Pertinent items noted in HPI and remainder of comprehensive ROS otherwise negative.   PHYSICAL EXAM  Patient Vitals for the past 24 hrs:  BP Temp Temp src Pulse Resp SpO2 Height Weight  08/28/24 0527 (!) 144/86 -- -- 77 -- -- -- --  08/28/24 0431 -- -- -- -- -- 100 % -- --  08/28/24 0315 129/82 98.4 F (36.9 C) Oral 87 20 100 % 5' 3 (1.6 m) 110.3 kg    Constitutional: Well-developed, well-nourished female in no acute distress.  Cardiovascular: normal rate & rhythm, warm and well-perfused Respiratory: normal effort, no problems with respiration noted, speaking in full sentences GI: Abd soft, non-tender, non-distended MS: Extremities nontender, no edema, normal ROM Neurologic: Alert and oriented x 4.  GU: no CVA tenderness Pelvic: Deferred       Labs: Results  for orders placed or performed during the hospital encounter of 08/28/24 (from the past 24 hours)  Resp panel by RT-PCR (RSV, Flu A&B, Covid) Anterior Nasal Swab     Status: None   Collection Time: 08/28/24  3:48 AM   Specimen: Anterior Nasal Swab  Result Value Ref Range   SARS Coronavirus 2 by RT PCR NEGATIVE NEGATIVE   Influenza A by PCR NEGATIVE NEGATIVE   Influenza B by PCR NEGATIVE NEGATIVE   Resp Syncytial Virus by PCR NEGATIVE NEGATIVE    Imaging:  No results found.  MDM & MAU COURSE  MDM: Low  MAU Course: Orders Placed This Encounter  Procedures   Resp panel by RT-PCR (RSV, Flu A&B, Covid) Anterior Nasal Swab   Airborne and Contact precautions   Discharge patient   Meds ordered this encounter  Medications   albuterol  (PROVENTIL ) (2.5 MG/3ML) 0.083% nebulizer solution 2.5 mg   albuterol  (VENTOLIN  HFA) 108 (90 Base) MCG/ACT inhaler 1-2 puff   albuterol  (VENTOLIN  HFA) 108 (90 Base) MCG/ACT inhaler    Sig: Inhale 1-2 puffs into the lungs every 4 (four) hours as needed for wheezing or shortness of  breath.    Dispense:  59.5 g    Refill:  3   VSS. Exam unremarkable. Will collect respiratory panel. Given timeline of patient's symptoms, would not be eligible for COVID or flu-specific medication. Patient does not have an albuterol  inhaler at home and would prefer nebulizer treatment prior to discharge. Lungs clear before and after treatment. Patient noted improvement after nebulizer treatment. Provided with albuterol  inhaler prior to discharge, as well as a list of cold/cough medications that are safe in pregnancy. All questions answered. ASSESSMENT   1. [redacted] weeks gestation of pregnancy   2. Viral URI with cough     PLAN  Discharge home in stable condition with return precautions.      Allergies as of 08/28/2024   No Known Allergies      Medication List     STOP taking these medications    ACCRUFeR  30 MG Caps Generic drug: Ferric Maltol    drospirenone -ethinyl estradiol  3-0.02 MG tablet Commonly known as: YAZ   ibuprofen  600 MG tablet Commonly known as: ADVIL    Prenatal Gummies 0.18-25 MG Chew   Senna-S 8.6-50 MG tablet Generic drug: senna-docusate       TAKE these medications    albuterol  108 (90 Base) MCG/ACT inhaler Commonly known as: VENTOLIN  HFA Inhale 1-2 puffs into the lungs every 4 (four) hours as needed for wheezing or shortness of breath. What changed:  how much to take when to take this   clindamycin 2 % vaginal cream Commonly known as: Cleocin Place 1 Applicatorful vaginally at bedtime.   prenatal multivitamin Tabs tablet Take 1 tablet by mouth daily at 12 noon.        Charlie Courts, MD  Family Medicine - Obstetrics Fellow

## 2024-08-28 NOTE — MAU Note (Signed)
..  Erika Klein is a 28 y.o. at [redacted]w[redacted]d here in MAU reporting: Chest hurts when she inhales, that began yesterday but tonight it woke her up from sleep. Reports sore throat for a week, that went away but then intensified yesterday.   Pain score: 7/10 Vitals:   08/28/24 0315  BP: 129/82  Pulse: 87  Resp: 20  Temp: 98.4 F (36.9 C)  SpO2: 100%     FHT:n/a Lab orders placed from triage: none

## 2024-08-28 NOTE — Progress Notes (Signed)
 Written and verbal d/c instructions given and pt voiced understanding.

## 2024-08-28 NOTE — Discharge Instructions (Signed)
 You came into the MAU because you were having a sore throat and tightness in your chest. We gave you a breathing treatment and you're feeling better. You can use the albuterol  inhaler every 4 hours as needed for difficulty breathing and/or wheezing. You can also look over the list of medications that are safe in pregnancy to help with the different symptoms you are having.

## 2024-09-14 ENCOUNTER — Ambulatory Visit: Admitting: Obstetrics and Gynecology

## 2024-09-21 ENCOUNTER — Inpatient Hospital Stay (HOSPITAL_COMMUNITY)
Admission: AD | Admit: 2024-09-21 | Discharge: 2024-09-21 | Disposition: A | Discharge status: Home / Self Care | Attending: Obstetrics and Gynecology | Admitting: Obstetrics and Gynecology

## 2024-09-21 ENCOUNTER — Other Ambulatory Visit: Payer: Self-pay

## 2024-09-21 DIAGNOSIS — O26899 Other specified pregnancy related conditions, unspecified trimester: Secondary | ICD-10-CM

## 2024-09-21 DIAGNOSIS — Z3A10 10 weeks gestation of pregnancy: Secondary | ICD-10-CM | POA: Diagnosis not present

## 2024-09-21 DIAGNOSIS — N949 Unspecified condition associated with female genital organs and menstrual cycle: Secondary | ICD-10-CM

## 2024-09-21 DIAGNOSIS — R109 Unspecified abdominal pain: Secondary | ICD-10-CM | POA: Diagnosis not present

## 2024-09-21 DIAGNOSIS — O26891 Other specified pregnancy related conditions, first trimester: Secondary | ICD-10-CM | POA: Diagnosis present

## 2024-09-21 DIAGNOSIS — O209 Hemorrhage in early pregnancy, unspecified: Secondary | ICD-10-CM | POA: Diagnosis not present

## 2024-09-21 DIAGNOSIS — N939 Abnormal uterine and vaginal bleeding, unspecified: Secondary | ICD-10-CM

## 2024-09-21 LAB — URINALYSIS, ROUTINE W REFLEX MICROSCOPIC
Bilirubin Urine: NEGATIVE
Glucose, UA: NEGATIVE mg/dL
Hgb urine dipstick: NEGATIVE
Ketones, ur: NEGATIVE mg/dL
Nitrite: NEGATIVE
Protein, ur: NEGATIVE mg/dL
Specific Gravity, Urine: 1.015 (ref 1.005–1.030)
pH: 7.5 (ref 5.0–8.0)

## 2024-09-21 LAB — WET PREP, GENITAL
Clue Cells Wet Prep HPF POC: NONE SEEN
Sperm: NONE SEEN
Trich, Wet Prep: NONE SEEN
WBC, Wet Prep HPF POC: <10 (ref <10)
Yeast Wet Prep HPF POC: NONE SEEN

## 2024-09-21 LAB — URINALYSIS, MICROSCOPIC (REFLEX)

## 2024-09-21 NOTE — MAU Note (Signed)
 Erika Klein is a 28 y.o. at [redacted]w[redacted]d here in MAU reporting: she's having abdominal cramping and light VB that last night, no VB currently.  Reports VB has stopped.  Denies recent intercourse.  LMP: 06/29/2024 Onset of complaint: last night Pain score: 6 Vitals:   09/21/24 0830  BP: 132/73  Pulse: 74  Resp: 19  Temp: 99 F (37.2 C)  SpO2: 99%     FHT: 156 bpm  Lab orders placed from triage: UA

## 2024-09-21 NOTE — MAU Provider Note (Signed)
 None     S [name] is a 28 y.o. [phone] pregnant female at [redacted]w[redacted]d who presents to MAU today with complaint of vaginal bleeding last night that has since resolved. She reports she just started a new job with [employer], was told (by [employer]) that since she was pregnant she was not supposed be lifting. However, has been being asked to lift at work, and so was lifting heavy boxes at work. She reports some cramping, comfortable now.    Receives care at [facility]. Prenatal records reviewed.  Pertinent items noted in HPI and remainder of comprehensive ROS otherwise negative.   O BP 132/73 (BP Location: Right Arm)   Pulse 74   Temp 99 F (37.2 C) (Oral)   Resp 19   Ht 5' 3 (1.6 m)   Wt 110.5 kg   LMP 06/29/2024 (Exact Date)   SpO2 99%   BMI 43.15 kg/m  Physical Exam Vitals reviewed.  Constitutional:      General: She is not in acute distress.    Appearance: Normal appearance. She is well-developed. She is not ill-appearing, toxic-appearing or diaphoretic.  HENT:     Head: Normocephalic.  Cardiovascular:     Rate and Rhythm: Normal rate.     Pulses: Normal pulses.     Heart sounds: Normal heart sounds.  Pulmonary:     Effort: Pulmonary effort is normal.  Skin:    General: Skin is warm and dry.     Capillary Refill: Capillary refill takes less than 2 seconds.  Neurological:     General: No focal deficit present.     Mental Status: She is alert and oriented to person, place, and time.  Psychiatric:        Mood and Affect: Mood normal.        Behavior: Behavior normal.      MDM: Low Evaluate for vaginal infection - none present Known IUP Work letter per patient request  MAU Course:  A [redacted] weeks gestation of pregnancy - Plan: Discharge patient  Round ligament pain  Abdominal cramping affecting pregnancy  Vagina bleeding  Medical screening exam complete  P - Discharge from MAU in stable condition with routine precautions - Follow up at [facility] as  scheduled for ongoing prenatal care. - Unclear cause of vaginal bleeding in pregnancy, given it has resolved and no acute cause found with IUP identified and FHTs present today, precautions advised.  - Work note per patient request. Patient has other forms to fill out regarding work accommodations. Advised to sent to office via MyChart or print and bring to next appointment.   Allergies as of 09/21/2024   No Known Allergies      Medication List     STOP taking these medications    clindamycin 2 % vaginal cream Commonly known as: Cleocin       TAKE these medications    albuterol  108 (90 Base) MCG/ACT inhaler Commonly known as: VENTOLIN  HFA Inhale 1-2 puffs into the lungs every 4 (four) hours as needed for wheezing or shortness of breath.   prenatal multivitamin Tabs tablet Take 1 tablet by mouth daily at 12 noon.        Camie Rote, MSN, CNM 09/21/2024 11:19 AM  Certified Nurse Midwife, [facility]

## 2024-09-22 ENCOUNTER — Encounter: Payer: Self-pay | Admitting: Certified Nurse Midwife

## 2024-09-22 LAB — GC/CHLAMYDIA PROBE AMP (~~LOC~~) NOT AT ARMC
Chlamydia: NEGATIVE
Comment: NEGATIVE
Comment: NORMAL
Neisseria Gonorrhea: NEGATIVE

## 2024-09-30 ENCOUNTER — Telehealth: Admitting: *Deleted

## 2024-09-30 DIAGNOSIS — Z3A12 12 weeks gestation of pregnancy: Secondary | ICD-10-CM | POA: Diagnosis not present

## 2024-09-30 DIAGNOSIS — Z8759 Personal history of other complications of pregnancy, childbirth and the puerperium: Secondary | ICD-10-CM | POA: Insufficient documentation

## 2024-09-30 DIAGNOSIS — O99211 Obesity complicating pregnancy, first trimester: Secondary | ICD-10-CM

## 2024-09-30 DIAGNOSIS — E669 Obesity, unspecified: Secondary | ICD-10-CM | POA: Diagnosis not present

## 2024-09-30 DIAGNOSIS — Z8679 Personal history of other diseases of the circulatory system: Secondary | ICD-10-CM | POA: Insufficient documentation

## 2024-09-30 DIAGNOSIS — O0990 Supervision of high risk pregnancy, unspecified, unspecified trimester: Secondary | ICD-10-CM | POA: Insufficient documentation

## 2024-09-30 DIAGNOSIS — O09291 Supervision of pregnancy with other poor reproductive or obstetric history, first trimester: Secondary | ICD-10-CM | POA: Diagnosis not present

## 2024-09-30 DIAGNOSIS — O0991 Supervision of high risk pregnancy, unspecified, first trimester: Secondary | ICD-10-CM

## 2024-09-30 DIAGNOSIS — O99210 Obesity complicating pregnancy, unspecified trimester: Secondary | ICD-10-CM | POA: Insufficient documentation

## 2024-09-30 DIAGNOSIS — O09899 Supervision of other high risk pregnancies, unspecified trimester: Secondary | ICD-10-CM | POA: Insufficient documentation

## 2024-09-30 MED ORDER — GOJJI WEIGHT SCALE MISC: 1.0000 | 0 refills | Status: AC

## 2024-09-30 MED ORDER — BLOOD PRESSURE KIT DEVI: 1.0000 | 0 refills | Status: AC | PRN

## 2024-09-30 NOTE — Progress Notes (Signed)
 New OB Intake  I connected with Erika Klein  on 09/30/24 at  1:15 PM EST by MyChart Video Visit and verified that I am speaking with the correct person using two identifiers. Nurse is located at [facility] and pt is located at in parked car.  I discussed the limitations, risks, security and privacy concerns of performing an evaluation and management service by telephone and the availability of in person appointments. I also discussed with the patient that there may be a patient responsible charge related to this service. The patient expressed understanding and agreed to proceed.  I explained I am completing New OB Intake today. We discussed EDD of 04/14/24 based on US  at [redacted]w[redacted]d weeks. Pt is [id number]. I reviewed her allergies, medications and Medical/Surgical/OB history.    Patient Active Problem List   Diagnosis Date Noted   Supervision of high risk pregnancy, antepartum 09/30/2024   Obesity affecting pregnancy 09/30/2024   History of hypertension 09/30/2024   History of prior pregnancy with IUGR newborn 09/30/2024   Short interval between pregnancies affecting pregnancy, antepartum 09/30/2024   Abdominal trauma 04/15/2024   Constipation 02/13/2024   Pelvic pain 02/13/2024   Fecal smearing 02/13/2024   Abnormal findings of gastrointestinal tract 01/15/2024   Alpha thalassemia silent carrier 11/17/2023   LGSIL on Pap smear of cervix 03/06/2022   Anxiety    Depression      Concerns addressed today  Delivery Plans Plans to deliver at [facility] Discussed the nature of our practice with multiple providers including residents and students as well as female and female providers. Due to the size of the practice, the delivering provider may not be the same as those providing prenatal care.   Patient is interested in water birth.  MyChart/Babyscripts MyChart access verified. I explained pt will have some visits in office and some virtually. Babyscripts instructions given and order  placed.  Blood Pressure Cuff/Weight Scale Blood pressure cuff ordered for patient to pick-up from [employer]. Explained after first prenatal appt pt will check weekly and document in Babyscripts. Patient does not have weight scale; order sent to [facility], patient may track weight weekly in Babyscripts.  Anatomy US  Explained first scheduled US  will be around 19 weeks. Anatomy US  scheduled for 11/25/24 at 10:00.  Is patient a CenteringPregnancy candidate?  Declined Declined due to Childcare   Is patient a Mom+Baby Combined Care candidate?  Not a candidate    Is patient a candidate for Babyscripts Optimization? No, due to hx elevated BP.   First visit review I reviewed new OB appt with patient. Explained pt will be seen by Dr. Danny at first visit. Discussed [place] genetic screening with patient. She would like Panorama drawn with routine prenatal labs at new ob visit.    Last Pap Diagnosis  Date Value Ref Range Status  11/20/2023   Final   - Negative for Intraepithelial Lesions or Malignancy (NILM)  11/20/2023 - Benign reactive/reparative changes  Final    Rock Skip PEAK 09/30/2024  1:54 PM

## 2024-10-06 ENCOUNTER — Telehealth: Admitting: Family Medicine

## 2024-10-06 DIAGNOSIS — O26892 Other specified pregnancy related conditions, second trimester: Secondary | ICD-10-CM

## 2024-10-06 NOTE — Progress Notes (Signed)
 For the safety of you and your child, I recommend a face to face office visit with a health care provider.  Many mothers need to take medicines during their pregnancy and while nursing.  Almost all medicines pass into the breast milk in small quantities.  Most are generally considered safe for a mother to take but some medicines must be avoided.  After reviewing your E-Visit request, I recommend that you consult your OB/GYN or pediatrician for medical advice in relation to your condition and prescription medications while pregnant or breastfeeding.  NOTE: There will be NO CHARGE for this E-Visit   If you are having a true medical emergency, please call 911.

## 2024-10-08 ENCOUNTER — Encounter: Payer: Self-pay | Admitting: Family Medicine

## 2024-10-10 ENCOUNTER — Encounter: Payer: Self-pay | Admitting: Certified Nurse Midwife

## 2024-10-14 ENCOUNTER — Encounter

## 2024-10-14 NOTE — Progress Notes (Deleted)
 INITIAL PRENATAL VISIT  Subjective:  Erika Klein is 28 y.o. [phone] female being seen today for her first obstetrical visit. She {has/has not:9024} received other prenatal care previously this pregnancy. This {is/is not:9024} a planned pregnancy. This {is/is not:9024} a desired pregnancy.  She is at [redacted]w[redacted]d gestation by *** Her obstetrical history is significant for {ob risk factors:10154}. Relationship with FOB: {fob:16621}. Patient {does/does not:19097} intend to breast feed.   Review of Systems:   ROS {sx:14538}.  Objective:   Obstetric History OB History  Gravida Para Term Preterm AB Living  6 4 4  0 1 4  SAB IAB Ectopic Multiple Live Births  1 0 0 0 4    # Outcome Date GA Lbr Len/2nd Weight Sex Type Anes PTL Lv  6 Current           5 Term 04/29/24 [redacted]w[redacted]d 03:07 / 00:05 5 lb 6 oz (2.438 kg) F Vag-Spont EPI  LIV     Birth Comments: IUGR  4 Term 08/01/22 [redacted]w[redacted]d 05:44 / 00:12 6 lb 12.6 oz (3.08 kg) M Vag-Spont EPI  LIV  3 Term 08/16/21 [redacted]w[redacted]d   F Vag-Spont   LIV     Birth Comments: elevated BP without dx HTN  2 Term 01/16/19 [redacted]w[redacted]d  5 lb 11 oz (2.58 kg) M Vag-Spont EPI  LIV     Birth Comments: ? 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  Current Outpatient Medications on File Prior to Visit  Medication Sig Dispense Refill   albuterol  (VENTOLIN  HFA) 108 (90 Base) MCG/ACT inhaler Inhale 1-2 puffs into the lungs every 4 (four) hours as needed for wheezing or shortness of breath. 59.5 g 3   Blood Pressure Monitoring (BLOOD PRESSURE KIT) DEVI 1 Device by Does not apply route as needed. 1 each 0   Misc. Devices (GOJJI WEIGHT SCALE) MISC 1 Device by Does not apply route once a week. 1 each  0   Prenatal Vit-Fe Fumarate-FA (PRENATAL MULTIVITAMIN) TABS tablet Take 1 tablet by mouth daily at 12 noon.     No current facility-administered medications on file prior to visit.    No Known Allergies  Social History:  reports that she has never smoked. She has never used smokeless tobacco. She reports that she does not currently use alcohol. She reports that she does not use drugs.  Family History  Problem Relation Age of Onset   Schizophrenia Mother    Bipolar disorder Mother    Anxiety disorder Mother    Uterine cancer Mother    Anxiety disorder Father    Asthma Father    Hypertension Maternal Grandmother    Stroke Paternal Grandmother    Hypertension Paternal Grandmother    Hypertension Maternal Aunt    Diabetes Neg Hx    Heart disease Neg Hx    Bladder Cancer Neg Hx    Renal cancer Neg Hx     The following portions of the patient's history were reviewed and updated as appropriate: allergies, current medications, past family  history, past medical history, past social history, past surgical history and problem list.  Physical Exam:  LMP 06/29/2024 (Exact Date)  CONSTITUTIONAL: Well-developed, well-nourished female in no acute distress.  HENT:  Normocephalic, atraumatic. Oropharynx is clear and moist EYES: Conjunctivae normal. No scleral icterus.  SKIN: Skin is warm and dry. No rash noted. Not diaphoretic. No erythema. No pallor. MUSCULOSKELETAL: Normal range of motion. No tenderness.  No cyanosis, clubbing, or edema.   NEUROLOGIC: Alert and oriented to person, place, and time. Normal muscle tone coordination.  PSYCHIATRIC: Normal mood and affect. Normal behavior. Normal judgment and thought content. CARDIOVASCULAR: Normal heart rate noted. RESPIRATORY: Effort and rate normal. BREASTS: Declined ABDOMEN: Soft, no distention, tenderness, rebound or guarding. Fundal ht: *** PELVIC: Normal appearing external genitalia; normal appearing vaginal mucosa and cervix.  No  abnormal discharge noted.  Pap smear ***obtained.  Uterus S=D, no other palpable masses, no uterine or adnexal tenderness. Fetal Status:           Indications for ASA therapy (per uptodate) One of the following: Previous pregnancy with preeclampsia, especially early onset and with an adverse outcome {yes/no:20286} Multifetal gestation {yes/no:20286} Chronic hypertension {yes/no:20286} Type 1 or 2 diabetes mellitus {yes/no:20286} Chronic kidney disease {yes/no:20286} Autoimmune disease (antiphospholipid syndrome, systemic lupus erythematosus) {yes/no:20286}  Two or more of the following: Nulliparity {yes/no:20286} Obesity (body mass index >30 kg/m2) {yes/no:20286} Family history of preeclampsia in mother or sister {yes/no:20286} Age >=35 years {yes/no:20286} Sociodemographic characteristics (African American race, low socioeconomic level) {yes/no:20286} Personal risk factors (eg, previous pregnancy with low birth weight or small for gestational age infant, previous adverse pregnancy outcome [eg, stillbirth], interval >10 years between pregnancies) {yes/no:20286}  Indications for early GDM screening  First-degree relative with diabetes {yes/no:20286} BMI >30kg/m2 {yes/no:20286} Age > 35 {yes/no:20286} Previous birth of an infant weighing >=4000 g {yes/no:20286} Gestational diabetes mellitus in a previous pregnancy {yes/no:20286} Glycated hemoglobin >=5.7 percent (39 mmol/mol), impaired glucose tolerance, or impaired fasting glucose on previous testing {yes/no:20286} High-risk race/ethnicity (eg, African American, Latino, Native American, Asian American, Pacific Islander) {yes/no:20286} Previous stillbirth of unknown cause {yes/no:20286} Maternal birthweight > 9 lbs {yes/no:20286} History of cardiovascular disease {yes/no:20286} Hypertension or on therapy for hypertension {yes/no:20286} High-density lipoprotein cholesterol level <35 mg/dL (9.09 mmol/L) and/or a triglyceride level >250  mg/dL (7.17 mmol/L) {bzd/wn:79713} Polycystic ovary syndrome {yes/no:20286} Physical inactivity {yes/no:20286} Other clinical condition associated with insulin resistance (eg, severe obesity, acanthosis nigricans) {yes/no:20286} Current use of glucocorticoids {yes/no:20286}  Early screening tests: FBS, A1C, Random CBG, glucose challenge   Assessment and Plan:  Erika Klein is 28 y.o. [redacted]w[redacted]d, 04/14/2025, by Ultrasound, presenting today for Initial OB Visit.   Assessment & Plan   Prenatal vitamins. Rx ASA for reduction of risk for preeclampsia.  Problem list reviewed and updated. Genetic screening discussed: NIPS/First trimester screen/Quad/AFP {requests/ordered/declines:14581}. Role of ultrasound in pregnancy discussed; Anatomy US : {requests/ordered/declines:14581}. Amniocentesis discussed: {amniocentesis:14582}. Follow up in {numbers 0-4:31231} weeks. (Traditional/Centering/Mom-Baby Combined Care) Discussed clinic routines, schedule of care and testing, genetic screening options. Reviewed structure of Faculty practice with patient, that they may not always see the same provider, and that physicians, fellows, CNM, NP, PA, residents, and medical students may all be involved in their care.  No follow-ups on file.  Barabara Maier, DO FM-OB Fellow [facility] [employer]

## 2024-10-19 ENCOUNTER — Other Ambulatory Visit (HOSPITAL_COMMUNITY)
Admission: RE | Admit: 2024-10-19 | Discharge: 2024-10-19 | Disposition: A | Source: Ambulatory Visit | Attending: Obstetrics and Gynecology | Admitting: Obstetrics and Gynecology

## 2024-10-19 ENCOUNTER — Encounter: Payer: Self-pay | Admitting: Obstetrics and Gynecology

## 2024-10-19 ENCOUNTER — Ambulatory Visit (INDEPENDENT_AMBULATORY_CARE_PROVIDER_SITE_OTHER): Admitting: Obstetrics and Gynecology

## 2024-10-19 ENCOUNTER — Other Ambulatory Visit: Payer: Self-pay

## 2024-10-19 VITALS — BP 120/65 | HR 83 | Wt 244.3 lb

## 2024-10-19 DIAGNOSIS — Z8759 Personal history of other complications of pregnancy, childbirth and the puerperium: Secondary | ICD-10-CM

## 2024-10-19 DIAGNOSIS — O09892 Supervision of other high risk pregnancies, second trimester: Secondary | ICD-10-CM

## 2024-10-19 DIAGNOSIS — Z3A14 14 weeks gestation of pregnancy: Secondary | ICD-10-CM | POA: Diagnosis not present

## 2024-10-19 DIAGNOSIS — O99212 Obesity complicating pregnancy, second trimester: Secondary | ICD-10-CM

## 2024-10-19 DIAGNOSIS — O0992 Supervision of high risk pregnancy, unspecified, second trimester: Secondary | ICD-10-CM | POA: Diagnosis not present

## 2024-10-19 DIAGNOSIS — N898 Other specified noninflammatory disorders of vagina: Secondary | ICD-10-CM

## 2024-10-19 DIAGNOSIS — Z87898 Personal history of other specified conditions: Secondary | ICD-10-CM | POA: Diagnosis not present

## 2024-10-19 DIAGNOSIS — F3289 Other specified depressive episodes: Secondary | ICD-10-CM

## 2024-10-19 DIAGNOSIS — O2992 Unspecified complication of anesthesia during pregnancy, second trimester: Secondary | ICD-10-CM | POA: Diagnosis not present

## 2024-10-19 DIAGNOSIS — O09899 Supervision of other high risk pregnancies, unspecified trimester: Secondary | ICD-10-CM

## 2024-10-19 DIAGNOSIS — O99342 Other mental disorders complicating pregnancy, second trimester: Secondary | ICD-10-CM

## 2024-10-19 DIAGNOSIS — D563 Thalassemia minor: Secondary | ICD-10-CM | POA: Diagnosis not present

## 2024-10-19 DIAGNOSIS — O99210 Obesity complicating pregnancy, unspecified trimester: Secondary | ICD-10-CM

## 2024-10-19 DIAGNOSIS — O0990 Supervision of high risk pregnancy, unspecified, unspecified trimester: Secondary | ICD-10-CM

## 2024-10-19 MED ORDER — ASPIRIN 81 MG PO TBEC: 81.0000 mg | DELAYED_RELEASE_TABLET | Freq: Every day | ORAL | 2 refills | Status: AC

## 2024-10-19 NOTE — Patient Instructions (Signed)
   Considering Waterbirth? Guide for patients at [facility] for [facility]) Why consider waterbirth? Gentle birth for babies  Less pain medicine used in labor  May allow for passive descent/less pushing  May reduce perineal tears  More mobility and instinctive maternal position changes  Increased maternal relaxation   Is waterbirth safe? What are the risks of infection, drowning or other complications? Infection:  Very low risk (3.7 % for tub vs 4.8% for bed)  7 in 8000 waterbirths with documented infection  Poorly cleaned equipment most common cause  Slightly lower group B strep transmission rate  Drowning  Maternal:  Very low risk  Related to seizures or fainting  Newborn:  Very low risk. No evidence of increased risk of respiratory problems in multiple large studies  Physiological protection from breathing under water  Avoid underwater birth if there are any fetal complications  Once baby's head is out of the water, keep it out.  Birth complication  Some reports of cord trauma, but risk decreased by bringing baby to surface gradually  No evidence of increased risk of shoulder dystocia. Mothers can usually change positions faster in water than in a bed, possibly aiding the maneuvers to free the shoulder.   There are 2 things you MUST do to have a waterbirth with [facility]: Attend a waterbirth class at [facility] at [facility]   3rd Wednesday of every month from 7-9 pm (virtual during COVID) [employer] at [email or web address] or [email or web address] or by calling [phone] Bring us  the certificate from the class to your prenatal appointment or send via MyChart Meet with a midwife at 36 weeks* to see if you can still plan a waterbirth and to sign the consent.   *We also recommend that you schedule as many of your prenatal visits with a midwife as possible.    Helpful information: You may want to bring a bathing suit top to the hospital  to wear during labor but this is optional.  All other supplies are provided by the hospital. Please arrive at the hospital with signs of active labor, and do not wait at home until late in labor. It takes 45 min- 1 hour for fetal monitoring, and check in to your room to take place, plus transport and filling of the waterbirth tub.    Things that would prevent you from having a waterbirth: Premature, <37wks  Previous cesarean birth  Presence of thick meconium-stained fluid  Multiple gestation (Twins, triplets, etc.)  Uncontrolled diabetes or gestational diabetes requiring medication  Hypertension diagnosed in pregnancy or preexisting hypertension (gestational hypertension, preeclampsia, or chronic hypertension) Fetal growth restriction (your baby measures less than 10th percentile on ultrasound) Heavy vaginal bleeding  Non-reassuring fetal heart rate  Active infection (MRSA, etc.). Group B Strep is NOT a contraindication for waterbirth.  If your labor has to be induced and induction method requires continuous monitoring of the baby's heart rate  Other risks/issues identified by your obstetrical provider   Please remember that birth is unpredictable. Under certain unforeseeable circumstances your provider may advise against giving birth in the tub. These decisions will be made on a case-by-case basis and with the safety of you and your baby as our highest priority.    Updated 02/07/22
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
## 2024-10-20 ENCOUNTER — Ambulatory Visit: Payer: Self-pay | Admitting: Obstetrics and Gynecology

## 2024-10-20 ENCOUNTER — Encounter: Payer: Self-pay | Admitting: Obstetrics and Gynecology

## 2024-10-20 DIAGNOSIS — B9689 Other specified bacterial agents as the cause of diseases classified elsewhere: Secondary | ICD-10-CM

## 2024-10-20 DIAGNOSIS — O0990 Supervision of high risk pregnancy, unspecified, unspecified trimester: Secondary | ICD-10-CM

## 2024-10-20 LAB — CERVICOVAGINAL ANCILLARY ONLY
Bacterial Vaginitis (gardnerella): POSITIVE — AB
Candida Glabrata: NEGATIVE
Candida Vaginitis: NEGATIVE
Chlamydia: NEGATIVE
Comment: NEGATIVE
Comment: NEGATIVE
Comment: NEGATIVE
Comment: NEGATIVE
Comment: NEGATIVE
Comment: NORMAL
Neisseria Gonorrhea: NEGATIVE
Trichomonas: NEGATIVE

## 2024-10-20 LAB — COMPREHENSIVE METABOLIC PANEL WITH GFR
ALT: 13 IU/L (ref 0–32)
AST: 17 IU/L (ref 0–40)
Albumin: 4 g/dL (ref 4.0–5.0)
Alkaline Phosphatase: 32 IU/L — ABNORMAL LOW (ref 41–116)
BUN/Creatinine Ratio: 15 (ref 9–23)
BUN: 9 mg/dL (ref 6–20)
Bilirubin Total: 0.5 mg/dL (ref 0.0–1.2)
CO2: 21 mmol/L (ref 20–29)
Calcium: 8.9 mg/dL (ref 8.7–10.2)
Chloride: 103 mmol/L (ref 96–106)
Creatinine, Ser: 0.6 mg/dL (ref 0.57–1.00)
Globulin, Total: 2.4 g/dL (ref 1.5–4.5)
Glucose: 76 mg/dL (ref 70–99)
Potassium: 3.8 mmol/L (ref 3.5–5.2)
Sodium: 136 mmol/L (ref 134–144)
Total Protein: 6.4 g/dL (ref 6.0–8.5)
eGFR: 126 mL/min/1.73 (ref >59)

## 2024-10-20 LAB — CBC/D/PLT+RPR+RH+ABO+RUBIGG...
Antibody Screen: NEGATIVE
Basophils Absolute: 0 x10E3/uL (ref 0.0–0.2)
Basos: 0 %
EOS (ABSOLUTE): 0.1 x10E3/uL (ref 0.0–0.4)
Eos: 2 %
HCV Ab: NONREACTIVE
HIV Screen 4th Generation wRfx: NONREACTIVE
Hematocrit: 36.4 % (ref 34.0–46.6)
Hemoglobin: 11.8 g/dL (ref 11.1–15.9)
Hepatitis B Surface Ag: NEGATIVE
Immature Grans (Abs): 0 x10E3/uL (ref 0.0–0.1)
Immature Granulocytes: 0 %
Lymphocytes Absolute: 1.2 x10E3/uL (ref 0.7–3.1)
Lymphs: 29 %
MCH: 30.5 pg (ref 26.6–33.0)
MCHC: 32.4 g/dL (ref 31.5–35.7)
MCV: 94 fL (ref 79–97)
Monocytes Absolute: 0.4 x10E3/uL (ref 0.1–0.9)
Monocytes: 10 %
Neutrophils Absolute: 2.5 x10E3/uL (ref 1.4–7.0)
Neutrophils: 59 %
Platelets: 206 x10E3/uL (ref 150–450)
RBC: 3.87 x10E6/uL (ref 3.77–5.28)
RDW: 11.4 % — ABNORMAL LOW (ref 11.7–15.4)
RPR Ser Ql: NONREACTIVE
Rh Factor: POSITIVE
Rubella Antibodies, IGG: 1.61 {index} (ref >0.99)
WBC: 4.1 x10E3/uL (ref 3.4–10.8)

## 2024-10-20 LAB — HEMOGLOBIN A1C
Est. average glucose Bld gHb Est-mCnc: 82 mg/dL
Hgb A1c MFr Bld: 4.5 % — ABNORMAL LOW (ref 4.8–5.6)

## 2024-10-20 LAB — HCV INTERPRETATION

## 2024-10-20 MED ORDER — METRONIDAZOLE 500 MG PO TABS: 500.0000 mg | ORAL_TABLET | Freq: Two times a day (BID) | ORAL | 0 refills | Status: AC

## 2024-10-21 LAB — URINE CULTURE, OB REFLEX

## 2024-10-21 LAB — CULTURE, OB URINE

## 2024-10-25 LAB — PANORAMA PRENATAL TEST FULL PANEL:PANORAMA TEST PLUS 5 ADDITIONAL MICRODELETIONS: FETAL FRACTION: 6.8

## 2024-11-18 ENCOUNTER — Encounter: Payer: Self-pay | Admitting: Obstetrics and Gynecology

## 2024-11-24 ENCOUNTER — Encounter: Payer: Self-pay | Admitting: Obstetrics and Gynecology

## 2024-11-25 ENCOUNTER — Ambulatory Visit: Attending: Obstetrics and Gynecology | Admitting: Obstetrics

## 2024-11-25 ENCOUNTER — Ambulatory Visit

## 2024-11-25 VITALS — BP 124/54 | HR 70

## 2024-11-25 DIAGNOSIS — O99210 Obesity complicating pregnancy, unspecified trimester: Secondary | ICD-10-CM

## 2024-11-25 DIAGNOSIS — Z3A20 20 weeks gestation of pregnancy: Secondary | ICD-10-CM | POA: Diagnosis not present

## 2024-11-25 DIAGNOSIS — E669 Obesity, unspecified: Secondary | ICD-10-CM | POA: Diagnosis not present

## 2024-11-25 DIAGNOSIS — O99212 Obesity complicating pregnancy, second trimester: Secondary | ICD-10-CM | POA: Diagnosis not present

## 2024-11-25 DIAGNOSIS — Z8679 Personal history of other diseases of the circulatory system: Secondary | ICD-10-CM

## 2024-11-25 DIAGNOSIS — O99012 Anemia complicating pregnancy, second trimester: Secondary | ICD-10-CM | POA: Diagnosis not present

## 2024-11-25 DIAGNOSIS — O0992 Supervision of high risk pregnancy, unspecified, second trimester: Secondary | ICD-10-CM | POA: Diagnosis not present

## 2024-11-25 DIAGNOSIS — Z3689 Encounter for other specified antenatal screening: Secondary | ICD-10-CM | POA: Diagnosis not present

## 2024-11-25 DIAGNOSIS — Z8759 Personal history of other complications of pregnancy, childbirth and the puerperium: Secondary | ICD-10-CM

## 2024-11-25 DIAGNOSIS — O09292 Supervision of pregnancy with other poor reproductive or obstetric history, second trimester: Secondary | ICD-10-CM

## 2024-11-25 DIAGNOSIS — O09899 Supervision of other high risk pregnancies, unspecified trimester: Secondary | ICD-10-CM

## 2024-11-25 DIAGNOSIS — D563 Thalassemia minor: Secondary | ICD-10-CM

## 2024-11-25 DIAGNOSIS — O321XX0 Maternal care for breech presentation, not applicable or unspecified: Secondary | ICD-10-CM | POA: Diagnosis present

## 2024-11-25 DIAGNOSIS — O0990 Supervision of high risk pregnancy, unspecified, unspecified trimester: Secondary | ICD-10-CM

## 2024-11-25 NOTE — Progress Notes (Signed)
 MFM Consult Note  Erika Klein is currently at [redacted]w[redacted]d. She was seen today for a detailed fetal anatomy scan due to maternal obesity with a BMI of 43.4 and a short interval pregnancy.  Her last pregnancy about 6 months ago was complicated by IUGR.  She denies any problems in her current pregnancy.    She had a cell free DNA test earlier in her pregnancy which indicated a low risk for trisomy 61, 80, and 13. A female fetus is predicted.   Sonographic findings Single intrauterine pregnancy at 20w 0d. Fetal cardiac activity:  Observed and appears normal. Presentation: Breech. The views of the fetal anatomy were limited today due to maternal body habitus and the fetal position.  What was visualized today appeared within normal limits. Fetal biometry shows the estimated fetal weight of 0 lb 10 oz, 293 grams (18%). Amniotic fluid: Within normal limits.  MVP: 5.7 cm. Placenta: Posterior. Adnexa: No adnexal mass visualized. Cervical length: 3.06 cm.  The patient was informed that anomalies may be missed due to technical limitations. If the fetus is in a suboptimal position or maternal habitus is increased, visualization of the fetus in the maternal uterus may be impaired.  Maternal obesity, short interval pregnancy, and history of IUGR  Due to maternal obesity, her history of IUGR, and short interval pregnancy, we will continue to follow her with growth ultrasounds throughout her pregnancy.  Due to maternal obesity, weekly fetal testing should be started at around 34 weeks and continued until delivery.  As women with an elevated BMI may be at increased risk for developing preeclampsia, she was advised to continue taking a daily baby aspirin  for preeclampsia prophylaxis.    A follow-up exam was scheduled in 4 weeks to complete the views of the fetal anatomy and to assess the fetal growth.  The patient stated that all of her questions were answered.   A total of 30 minutes was spent  counseling and coordinating the care for this patient.  Greater than 50% of the time was spent in direct face-to-face contact.

## 2024-11-29 ENCOUNTER — Inpatient Hospital Stay (HOSPITAL_COMMUNITY)
Admission: AD | Admit: 2024-11-29 | Discharge: 2024-11-29 | Disposition: A | Discharge status: Home / Self Care | Attending: Obstetrics and Gynecology | Admitting: Obstetrics and Gynecology

## 2024-11-29 ENCOUNTER — Encounter: Payer: Self-pay | Admitting: Nurse Practitioner

## 2024-11-29 ENCOUNTER — Other Ambulatory Visit: Payer: Self-pay

## 2024-11-29 ENCOUNTER — Other Ambulatory Visit: Payer: Self-pay | Admitting: Nurse Practitioner

## 2024-11-29 DIAGNOSIS — K0530 Chronic periodontitis, unspecified: Secondary | ICD-10-CM

## 2024-11-29 DIAGNOSIS — Z3A20 20 weeks gestation of pregnancy: Secondary | ICD-10-CM | POA: Diagnosis not present

## 2024-11-29 DIAGNOSIS — O99891 Other specified diseases and conditions complicating pregnancy: Secondary | ICD-10-CM | POA: Insufficient documentation

## 2024-11-29 DIAGNOSIS — K047 Periapical abscess without sinus: Secondary | ICD-10-CM | POA: Diagnosis not present

## 2024-11-29 DIAGNOSIS — O26892 Other specified pregnancy related conditions, second trimester: Secondary | ICD-10-CM | POA: Diagnosis not present

## 2024-11-29 DIAGNOSIS — K0889 Other specified disorders of teeth and supporting structures: Secondary | ICD-10-CM | POA: Diagnosis present

## 2024-11-29 MED ORDER — AMOXICILLIN-POT CLAVULANATE 875-125 MG PO TABS: 1.0000 | ORAL_TABLET | Freq: Two times a day (BID) | ORAL | 1 refills | Status: AC

## 2024-11-29 MED ORDER — KETOROLAC TROMETHAMINE 30 MG/ML IJ SOLN
30.0000 mg | Freq: Once | INTRAMUSCULAR | Status: AC
  Administered 2024-11-29: 30 mg via INTRAMUSCULAR
  Filled 2024-11-29: qty 1

## 2024-11-29 NOTE — Discharge Instructions (Signed)
" °  Please contact your dentist regarding the pain that you are having and see if you can be seen for an urgent visit. If you need additional assistance please do not hesitate to contact our office  "
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
## 2024-11-29 NOTE — Progress Notes (Signed)
 Pt left prior to RN re-evaluating pain after receiving Toradol  and having discharge instructions reviewed and given.

## 2024-11-29 NOTE — MAU Note (Signed)
 Erika Klein is a 29 y.o. at [redacted]w[redacted]d here in MAU reporting: she's had a toothache for the past week.  Reports it's the wisdom tooth located in the right lower jaw, it's a constant throbbing pain.  States she was unable to sleep last night.  Reports has been taking Tylenol  500 mg and it hasn't been working.  States last took Tylenol  last night @ 2300.   Denies pregnancy related complaints, no VB or LOF.  LMP: 06/29/2024 Onset of complaint: 1 week Pain score: 10 Vitals:   11/29/24 1259  BP: 139/76  Resp: 19  Temp: 99 F (37.2 C)  SpO2: 98%     FHT: 151 bpm  Lab orders placed from triage: None

## 2024-12-15 ENCOUNTER — Encounter: Payer: Self-pay | Admitting: Obstetrics and Gynecology

## 2024-12-31 ENCOUNTER — Ambulatory Visit

## 2024-12-31 ENCOUNTER — Other Ambulatory Visit

## 2025-01-12 ENCOUNTER — Other Ambulatory Visit: Payer: Self-pay

## 2025-02-09 ENCOUNTER — Encounter: Payer: Self-pay | Admitting: Obstetrics and Gynecology
